# Patient Record
Sex: Male | Born: 1955 | Race: White | Hispanic: No | Smoking: Current every day smoker
Health system: Southern US, Community
[De-identification: ages and names within clinical notes are randomized; demographics above are authoritative.]

## PROBLEM LIST (undated history)

## (undated) DIAGNOSIS — M545 Low back pain, unspecified: Secondary | ICD-10-CM

## (undated) DIAGNOSIS — S82892A Other fracture of left lower leg, initial encounter for closed fracture: Secondary | ICD-10-CM

## (undated) DIAGNOSIS — J449 Chronic obstructive pulmonary disease, unspecified: Secondary | ICD-10-CM

## (undated) DIAGNOSIS — G8929 Other chronic pain: Secondary | ICD-10-CM

## (undated) DIAGNOSIS — Z720 Tobacco use: Secondary | ICD-10-CM

## (undated) HISTORY — PX: INCISION AND DRAINAGE OF WOUND: SHX1803

---

## 1998-08-05 ENCOUNTER — Emergency Department (HOSPITAL_COMMUNITY): Admission: EM | Admit: 1998-08-05 | Discharge: 1998-08-05 | Payer: Self-pay | Admitting: Emergency Medicine

## 2004-02-04 ENCOUNTER — Emergency Department (HOSPITAL_COMMUNITY): Admission: EM | Admit: 2004-02-04 | Discharge: 2004-02-04 | Payer: Self-pay | Admitting: Emergency Medicine

## 2004-02-15 ENCOUNTER — Emergency Department (HOSPITAL_COMMUNITY): Admission: EM | Admit: 2004-02-15 | Discharge: 2004-02-15 | Payer: Self-pay | Admitting: Emergency Medicine

## 2013-09-21 ENCOUNTER — Inpatient Hospital Stay (HOSPITAL_BASED_OUTPATIENT_CLINIC_OR_DEPARTMENT_OTHER)
Admission: EM | Admit: 2013-09-21 | Discharge: 2013-09-23 | DRG: 190 | Disposition: A | Discharge status: Home / Self Care | Payer: Self-pay | Attending: Internal Medicine | Admitting: Internal Medicine

## 2013-09-21 ENCOUNTER — Encounter (HOSPITAL_BASED_OUTPATIENT_CLINIC_OR_DEPARTMENT_OTHER): Payer: Self-pay | Admitting: Emergency Medicine

## 2013-09-21 ENCOUNTER — Emergency Department (HOSPITAL_BASED_OUTPATIENT_CLINIC_OR_DEPARTMENT_OTHER): Payer: Self-pay

## 2013-09-21 DIAGNOSIS — Z833 Family history of diabetes mellitus: Secondary | ICD-10-CM

## 2013-09-21 DIAGNOSIS — J209 Acute bronchitis, unspecified: Secondary | ICD-10-CM | POA: Diagnosis present

## 2013-09-21 DIAGNOSIS — J45901 Unspecified asthma with (acute) exacerbation: Principal | ICD-10-CM

## 2013-09-21 DIAGNOSIS — Z23 Encounter for immunization: Secondary | ICD-10-CM

## 2013-09-21 DIAGNOSIS — Z8249 Family history of ischemic heart disease and other diseases of the circulatory system: Secondary | ICD-10-CM

## 2013-09-21 DIAGNOSIS — J440 Chronic obstructive pulmonary disease with acute lower respiratory infection: Secondary | ICD-10-CM | POA: Diagnosis present

## 2013-09-21 DIAGNOSIS — J4489 Other specified chronic obstructive pulmonary disease: Secondary | ICD-10-CM | POA: Diagnosis present

## 2013-09-21 DIAGNOSIS — Z800 Family history of malignant neoplasm of digestive organs: Secondary | ICD-10-CM

## 2013-09-21 DIAGNOSIS — F19939 Other psychoactive substance use, unspecified with withdrawal, unspecified: Secondary | ICD-10-CM

## 2013-09-21 DIAGNOSIS — F17200 Nicotine dependence, unspecified, uncomplicated: Secondary | ICD-10-CM | POA: Diagnosis present

## 2013-09-21 DIAGNOSIS — R072 Precordial pain: Secondary | ICD-10-CM

## 2013-09-21 DIAGNOSIS — J449 Chronic obstructive pulmonary disease, unspecified: Secondary | ICD-10-CM | POA: Diagnosis present

## 2013-09-21 DIAGNOSIS — J9600 Acute respiratory failure, unspecified whether with hypoxia or hypercapnia: Secondary | ICD-10-CM | POA: Diagnosis present

## 2013-09-21 DIAGNOSIS — R0789 Other chest pain: Secondary | ICD-10-CM

## 2013-09-21 DIAGNOSIS — J441 Chronic obstructive pulmonary disease with (acute) exacerbation: Principal | ICD-10-CM

## 2013-09-21 DIAGNOSIS — J9601 Acute respiratory failure with hypoxia: Secondary | ICD-10-CM

## 2013-09-21 DIAGNOSIS — F17213 Nicotine dependence, cigarettes, with withdrawal: Secondary | ICD-10-CM

## 2013-09-21 HISTORY — DX: Chronic obstructive pulmonary disease, unspecified: J44.9

## 2013-09-21 HISTORY — DX: Tobacco use: Z72.0

## 2013-09-21 HISTORY — DX: Low back pain: M54.5

## 2013-09-21 HISTORY — DX: Other fracture of left lower leg, initial encounter for closed fracture: S82.892A

## 2013-09-21 HISTORY — DX: Low back pain, unspecified: M54.50

## 2013-09-21 HISTORY — DX: Other chronic pain: G89.29

## 2013-09-21 LAB — BASIC METABOLIC PANEL
Anion gap: 12 (ref 5–15)
BUN: 8 mg/dL (ref 6–23)
CHLORIDE: 101 meq/L (ref 96–112)
CO2: 29 mEq/L (ref 19–32)
Calcium: 9.9 mg/dL (ref 8.4–10.5)
Creatinine, Ser: 1 mg/dL (ref 0.50–1.35)
GFR calc non Af Amer: 82 mL/min — ABNORMAL LOW (ref >90)
Glucose, Bld: 126 mg/dL — ABNORMAL HIGH (ref 70–99)
POTASSIUM: 4.1 meq/L (ref 3.7–5.3)
Sodium: 142 mEq/L (ref 137–147)

## 2013-09-21 LAB — GLUCOSE, CAPILLARY: Glucose-Capillary: 152 mg/dL — ABNORMAL HIGH (ref 70–99)

## 2013-09-21 LAB — CBC WITH DIFFERENTIAL/PLATELET
Basophils Absolute: 0.1 10*3/uL (ref 0.0–0.1)
Basophils Relative: 1 % (ref 0–1)
Eosinophils Absolute: 0.9 10*3/uL — ABNORMAL HIGH (ref 0.0–0.7)
Eosinophils Relative: 9 % — ABNORMAL HIGH (ref 0–5)
HCT: 46.2 % (ref 39.0–52.0)
Hemoglobin: 15.7 g/dL (ref 13.0–17.0)
Lymphocytes Relative: 20 % (ref 12–46)
Lymphs Abs: 2 10*3/uL (ref 0.7–4.0)
MCH: 31.2 pg (ref 26.0–34.0)
MCHC: 34 g/dL (ref 30.0–36.0)
MCV: 91.8 fL (ref 78.0–100.0)
Monocytes Absolute: 0.4 10*3/uL (ref 0.1–1.0)
Monocytes Relative: 4 % (ref 3–12)
NEUTROS PCT: 66 % (ref 43–77)
Neutro Abs: 6.5 10*3/uL (ref 1.7–7.7)
Platelets: 260 10*3/uL (ref 150–400)
RBC: 5.03 MIL/uL (ref 4.22–5.81)
RDW: 13.4 % (ref 11.5–15.5)
WBC: 10 10*3/uL (ref 4.0–10.5)

## 2013-09-21 LAB — I-STAT ARTERIAL BLOOD GAS, ED
Bicarbonate: 25 mEq/L — ABNORMAL HIGH (ref 20.0–24.0)
O2 Saturation: 88 %
TCO2: 26 mmol/L (ref 0–100)
pCO2 arterial: 38.5 mmHg (ref 35.0–45.0)
pH, Arterial: 7.419 (ref 7.350–7.450)
pO2, Arterial: 53 mmHg — ABNORMAL LOW (ref 80.0–100.0)

## 2013-09-21 LAB — PRO B NATRIURETIC PEPTIDE: Pro B Natriuretic peptide (BNP): 27.6 pg/mL (ref 0–125)

## 2013-09-21 LAB — TROPONIN I
Troponin I: <0.3 ng/mL (ref <0.30)
Troponin I: <0.3 ng/mL (ref <0.30)

## 2013-09-21 LAB — MAGNESIUM: Magnesium: 2 mg/dL (ref 1.5–2.5)

## 2013-09-21 LAB — MRSA PCR SCREENING: MRSA BY PCR: NEGATIVE

## 2013-09-21 MED ORDER — ALBUTEROL (5 MG/ML) CONTINUOUS INHALATION SOLN
10.0000 mg/h | INHALATION_SOLUTION | RESPIRATORY_TRACT | Status: DC
  Administered 2013-09-21: 10 mg/h via RESPIRATORY_TRACT

## 2013-09-21 MED ORDER — ALBUTEROL SULFATE (2.5 MG/3ML) 0.083% IN NEBU: 10.0000 mg | INHALATION_SOLUTION | Freq: Once | RESPIRATORY_TRACT | Status: DC

## 2013-09-21 MED ORDER — ACETAMINOPHEN 650 MG RE SUPP: 650.0000 mg | Freq: Four times a day (QID) | RECTAL | Status: DC | PRN

## 2013-09-21 MED ORDER — PREDNISONE 50 MG PO TABS
60.0000 mg | ORAL_TABLET | Freq: Every day | ORAL | Status: DC
  Administered 2013-09-22: 60 mg via ORAL
  Filled 2013-09-21 (×2): qty 1

## 2013-09-21 MED ORDER — MAGNESIUM SULFATE IN D5W 10-5 MG/ML-% IV SOLN
1.0000 g | Freq: Once | INTRAVENOUS | Status: DC
  Filled 2013-09-21: qty 100

## 2013-09-21 MED ORDER — ALUM & MAG HYDROXIDE-SIMETH 200-200-20 MG/5ML PO SUSP
30.0000 mL | Freq: Four times a day (QID) | ORAL | Status: DC | PRN
  Filled 2013-09-21: qty 30

## 2013-09-21 MED ORDER — GUAIFENESIN-DM 100-10 MG/5ML PO SYRP
5.0000 mL | ORAL_SOLUTION | ORAL | Status: DC | PRN
  Administered 2013-09-21 – 2013-09-22 (×2): 5 mL via ORAL
  Filled 2013-09-21 (×3): qty 5

## 2013-09-21 MED ORDER — ACETAMINOPHEN 325 MG PO TABS
650.0000 mg | ORAL_TABLET | Freq: Four times a day (QID) | ORAL | Status: DC | PRN
  Administered 2013-09-22: 650 mg via ORAL
  Filled 2013-09-21: qty 2

## 2013-09-21 MED ORDER — IPRATROPIUM BROMIDE 0.02 % IN SOLN
1.0000 mg | Freq: Once | RESPIRATORY_TRACT | Status: AC
  Administered 2013-09-21: 1 mg via RESPIRATORY_TRACT
  Filled 2013-09-21: qty 5

## 2013-09-21 MED ORDER — IPRATROPIUM-ALBUTEROL 0.5-2.5 (3) MG/3ML IN SOLN
3.0000 mL | Freq: Three times a day (TID) | RESPIRATORY_TRACT | Status: DC
  Administered 2013-09-21 – 2013-09-22 (×2): 3 mL via RESPIRATORY_TRACT
  Filled 2013-09-21 (×2): qty 3

## 2013-09-21 MED ORDER — MOMETASONE FURO-FORMOTEROL FUM 100-5 MCG/ACT IN AERO
2.0000 | INHALATION_SPRAY | Freq: Two times a day (BID) | RESPIRATORY_TRACT | Status: DC
  Administered 2013-09-22 – 2013-09-23 (×3): 2 via RESPIRATORY_TRACT
  Filled 2013-09-21 (×2): qty 8.8

## 2013-09-21 MED ORDER — METHYLPREDNISOLONE SODIUM SUCC 125 MG IJ SOLR
60.0000 mg | Freq: Two times a day (BID) | INTRAMUSCULAR | Status: AC
  Administered 2013-09-21: 23:00:00 via INTRAVENOUS
  Administered 2013-09-21: 60 mg via INTRAVENOUS
  Filled 2013-09-21 (×3): qty 0.96

## 2013-09-21 MED ORDER — NICOTINE 14 MG/24HR TD PT24
14.0000 mg | MEDICATED_PATCH | Freq: Every day | TRANSDERMAL | Status: DC
  Administered 2013-09-21 – 2013-09-22 (×2): 14 mg via TRANSDERMAL
  Filled 2013-09-21 (×3): qty 1

## 2013-09-21 MED ORDER — MAGNESIUM SULFATE 40 MG/ML IJ SOLN
2.0000 g | Freq: Once | INTRAMUSCULAR | Status: AC
  Administered 2013-09-21: 2 g via INTRAVENOUS
  Filled 2013-09-21: qty 50

## 2013-09-21 MED ORDER — LEVOFLOXACIN 750 MG PO TABS
750.0000 mg | ORAL_TABLET | Freq: Every day | ORAL | Status: DC
  Administered 2013-09-22 – 2013-09-23 (×2): 750 mg via ORAL
  Filled 2013-09-21 (×2): qty 1

## 2013-09-21 MED ORDER — ASPIRIN EC 325 MG PO TBEC
325.0000 mg | DELAYED_RELEASE_TABLET | Freq: Once | ORAL | Status: AC
Start: 2013-09-21 — End: 2013-09-21
  Administered 2013-09-21: 325 mg via ORAL
  Filled 2013-09-21: qty 1

## 2013-09-21 MED ORDER — ALBUTEROL (5 MG/ML) CONTINUOUS INHALATION SOLN
10.0000 mg/h | INHALATION_SOLUTION | RESPIRATORY_TRACT | Status: DC
  Administered 2013-09-21: 10 mg/h via RESPIRATORY_TRACT
  Filled 2013-09-21: qty 20

## 2013-09-21 MED ORDER — IPRATROPIUM-ALBUTEROL 0.5-2.5 (3) MG/3ML IN SOLN
3.0000 mL | RESPIRATORY_TRACT | Status: DC
  Administered 2013-09-21 (×2): 3 mL via RESPIRATORY_TRACT
  Filled 2013-09-21 (×2): qty 3

## 2013-09-21 MED ORDER — CEFTRIAXONE SODIUM 1 G IJ SOLR
INTRAMUSCULAR | Status: AC
  Administered 2013-09-21: 1000 mg
  Filled 2013-09-21: qty 10

## 2013-09-21 MED ORDER — DEXTROSE 5 % IV SOLN: 1.0000 g | Freq: Once | INTRAVENOUS | Status: AC

## 2013-09-21 MED ORDER — BENZONATATE 100 MG PO CAPS
100.0000 mg | ORAL_CAPSULE | Freq: Three times a day (TID) | ORAL | Status: DC
  Administered 2013-09-21 – 2013-09-22 (×4): 100 mg via ORAL
  Filled 2013-09-21 (×6): qty 1

## 2013-09-21 MED ORDER — PNEUMOCOCCAL VAC POLYVALENT 25 MCG/0.5ML IJ INJ
0.5000 mL | INJECTION | INTRAMUSCULAR | Status: AC
  Administered 2013-09-23: 0.5 mL via INTRAMUSCULAR
  Filled 2013-09-21 (×2): qty 0.5

## 2013-09-21 MED ORDER — BENZONATATE 100 MG PO CAPS
200.0000 mg | ORAL_CAPSULE | Freq: Once | ORAL | Status: AC
  Administered 2013-09-21: 200 mg via ORAL
  Filled 2013-09-21: qty 2

## 2013-09-21 MED ORDER — SODIUM CHLORIDE 0.9 % IJ SOLN
3.0000 mL | Freq: Two times a day (BID) | INTRAMUSCULAR | Status: DC
  Administered 2013-09-21 (×2): 3 mL via INTRAVENOUS
  Administered 2013-09-22: 23:00:00 via INTRAVENOUS
  Administered 2013-09-22: 3 mL via INTRAVENOUS

## 2013-09-21 MED ORDER — SODIUM CHLORIDE 0.9 % IV SOLN: INTRAVENOUS | Status: DC

## 2013-09-21 MED ORDER — ASPIRIN EC 81 MG PO TBEC
81.0000 mg | DELAYED_RELEASE_TABLET | Freq: Every day | ORAL | Status: DC
  Administered 2013-09-22 – 2013-09-23 (×2): 81 mg via ORAL
  Filled 2013-09-21 (×2): qty 1

## 2013-09-21 MED ORDER — ONDANSETRON HCL 4 MG/2ML IJ SOLN: 4.0000 mg | Freq: Four times a day (QID) | INTRAMUSCULAR | Status: DC | PRN

## 2013-09-21 MED ORDER — BISACODYL 10 MG RE SUPP
10.0000 mg | Freq: Every day | RECTAL | Status: DC | PRN
Start: 2013-09-21 — End: 2013-09-23

## 2013-09-21 MED ORDER — ADULT MULTIVITAMIN W/MINERALS CH
1.0000 | ORAL_TABLET | Freq: Every day | ORAL | Status: DC
  Administered 2013-09-21 – 2013-09-23 (×3): 1 via ORAL
  Filled 2013-09-21 (×3): qty 1

## 2013-09-21 MED ORDER — POLYETHYLENE GLYCOL 3350 17 G PO PACK
17.0000 g | PACK | Freq: Every day | ORAL | Status: DC | PRN
  Filled 2013-09-21: qty 1

## 2013-09-21 MED ORDER — ENOXAPARIN SODIUM 40 MG/0.4ML ~~LOC~~ SOLN
40.0000 mg | SUBCUTANEOUS | Status: DC
  Administered 2013-09-21 – 2013-09-22 (×2): 40 mg via SUBCUTANEOUS
  Filled 2013-09-21 (×3): qty 0.4

## 2013-09-21 MED ORDER — AZITHROMYCIN 500 MG IV SOLR
500.0000 mg | Freq: Once | INTRAVENOUS | Status: AC
  Administered 2013-09-21: 500 mg via INTRAVENOUS
  Filled 2013-09-21: qty 500

## 2013-09-21 MED ORDER — ONDANSETRON HCL 4 MG PO TABS: 4.0000 mg | ORAL_TABLET | Freq: Four times a day (QID) | ORAL | Status: DC | PRN

## 2013-09-21 NOTE — ED Notes (Signed)
Patient states for the past 8-9 months he has been unable to walk more than @ 20 yards without becoming severely SOB. Patient states this was going on prior to his admission to [facility] (December 2014).

## 2013-09-21 NOTE — Progress Notes (Signed)
I spoke with Dr. Malachi BondsShort . She feels this patient's chest pain is due to his COPD and cough. We are cycling troponin's due to family history to be thorough.

## 2013-09-21 NOTE — ED Notes (Addendum)
Per EMS, patient with ongoing respiratory difficulties since December. Patient reports over past 4 days symptoms have worsened. Patient with cough, states he does have some phlegm, unwitnessed by EMS. Patient states it feels like "there is concrete in my chest". Patient drinks only Mtn Dew, does not drink water. Patient was given 10mg  albuterol neb, 0.5mg  atrovent neb, and 125mg  Solumedrol IV with EMS.

## 2013-09-21 NOTE — Clinical Social Work Note (Signed)
CSW consulted for financial concerns regarding lack of health insurance. CS[facility] has [facility] regarding pt's concerns. CSW signing off. Thank you for the referral.  Marcelline DeistEmily Makhai Fulco, MSW, [facility] Licensed Clinical Social Worker [phone] and [phone] [phone]

## 2013-09-21 NOTE — ED Notes (Signed)
Urinal provided.

## 2013-09-21 NOTE — Progress Notes (Signed)
  Echocardiogram 2D Echocardiogram has been performed.  Leta JunglingCooper, Anthany Thornhill M 09/21/2013, 2:41 PM

## 2013-09-21 NOTE — ED Notes (Signed)
MD at bedside. 

## 2013-09-21 NOTE — ED Provider Notes (Signed)
CSN: [id number]     Arrival date & time 09/21/13  [id number] History   First MD Initiated Contact with Patient 09/21/13 [phone]     Chief Complaint  Patient presents with  . Cough  . Shortness of Breath     (Consider location/radiation/quality/duration/timing/severity/associated sxs/prior Treatment) Patient is a 58 y.o. male presenting with cough and shortness of breath. The history is provided by the patient and the EMS personnel. The history is limited by the condition of the patient.  Cough Cough characteristics:  Productive Sputum characteristics:  White Severity:  Severe Onset quality:  Gradual Timing:  Constant Progression:  Worsening Chronicity:  Chronic Smoker: yes   Context: not sick contacts   Relieved by:  Nothing Worsened by:  Nothing tried Associated symptoms: chest pain, shortness of breath and wheezing   Associated symptoms: no fever   Risk factors: no chemical exposure and no recent travel   Shortness of Breath Associated symptoms: chest pain, cough and wheezing   Associated symptoms: no fever     Past Medical History  Diagnosis Date  . COPD (chronic obstructive pulmonary disease)    History reviewed. No pertinent past surgical history. No family history on file. History  Substance Use Topics  . Smoking status: Current Every Day Smoker -- 0.50 packs/day for 85 years    Types: Cigarettes  . Smokeless tobacco: Never Used  . Alcohol Use: No    Review of Systems  Constitutional: Negative for fever.  Respiratory: Positive for cough, shortness of breath and wheezing.   Cardiovascular: Positive for chest pain.  All other systems reviewed and are negative.     Allergies  Review of patient's allergies indicates no known allergies.  Home Medications   Prior to Admission medications   Not on File   BP 141/83  Pulse 94  Temp(Src) 97.4 F (36.3 C) (Oral)  Resp 23  Wt 132 lb (59.875 kg)  SpO2 94% Physical Exam  Constitutional: He is oriented to person,  place, and time. He appears well-developed and well-nourished.  HENT:  Head: Normocephalic and atraumatic.  Eyes: Conjunctivae are normal. Pupils are equal, round, and reactive to light.  Neck: Normal range of motion. Neck supple.  Cardiovascular: Normal rate, regular rhythm and intact distal pulses.   Pulmonary/Chest: No stridor. He is in respiratory distress. He has wheezes.  Abdominal: Soft. Bowel sounds are normal. He exhibits no distension. There is no tenderness. There is no rebound.  Musculoskeletal: Normal range of motion. He exhibits no edema.  Neurological: He is alert and oriented to person, place, and time.  Skin: Skin is warm and dry.  Psychiatric: He has a normal mood and affect.    ED Course  Procedures (including critical care time) Labs Review Labs Reviewed  CBC WITH DIFFERENTIAL - Abnormal; Notable for the following:    Eosinophils Relative 9 (*)    Eosinophils Absolute 0.9 (*)    All other components within normal limits  BASIC METABOLIC PANEL - Abnormal; Notable for the following:    Glucose, Bld 126 (*)    GFR calc non Af Amer 82 (*)    All other components within normal limits  I-STAT ARTERIAL BLOOD GAS, ED - Abnormal; Notable for the following:    pO2, Arterial 53.0 (*)    Bicarbonate 25.0 (*)    All other components within normal limits  TROPONIN I  PRO B NATRIURETIC PEPTIDE  MAGNESIUM  BLOOD GAS, VENOUS    Imaging Review No results found.   EKG Interpretation  None      MDM   Final diagnoses:  None     Date: 09/21/2013  Rate: 84  Rhythm: normal sinus rhythm  QRS Axis: normal  Intervals: normal  ST/T Wave abnormalities: normal  Conduction Disutrbances: none  Narrative Interpretation: unremarkable    Medications  albuterol (PROVENTIL) (2.5 MG/3ML) 0.083% nebulizer solution 10 mg (10 mg Nebulization Not Given 09/21/13 0617)  albuterol (PROVENTIL,VENTOLIN) solution continuous neb (10 mg/hr Nebulization Bolus from Bag 09/21/13 0617)   cefTRIAXone (ROCEPHIN) 1 g in dextrose 5 % 50 mL IVPB (not administered)  azithromycin (ZITHROMAX) 500 mg in dextrose 5 % 250 mL IVPB (not administered)  benzonatate (TESSALON) capsule 200 mg (200 mg Oral Given 09/21/13 0636)  magnesium sulfate IVPB 2 g 50 mL (0 g Intravenous Stopped 09/21/13 0643)    CRITICAL CARE Performed by: Jasmine Awe Total critical care time: 31 minutes Critical care time was exclusive of separately billable procedures and treating other patients. Critical care was necessary to treat or prevent imminent or life-threatening deterioration. Critical care was time spent personally by me on the following activities: development of treatment plan with patient and/or surrogate as well as nursing, discussions with consultants, evaluation of patient's response to treatment, examination of patient, obtaining history from patient or surrogate, ordering and performing treatments and interventions, ordering and review of laboratory studies, ordering and review of radiographic studies, pulse oximetry and re-evaluation of patient's condition.   Jasmine Awe, MD 09/21/13 [phone]

## 2013-09-21 NOTE — H&P (Addendum)
[facility] History and Physical  Wynonia HazardDavid W Ergle ACZ:[id number] DOB: 10/10/1955 DOA: 09/21/2013  Referring physician:  Terressa KoyanagiPalumbo-Rasch PCP:  No PCP Per Patient   Chief Complaint:  SOB  HPI:  The patient is a 58 y.o. year-old male with history of tobacco abuse with 5885 PYH and COPD who presents with SOB.  At his baseline, he is only able to walk about 30-ft before he becomes [place] of breath.  He does not have insurance nor does he have a primary care doctor.  He was seen in urgent care in December with SOB and diagnosed with COPD.  At that time, he was smoking 2PPD.  He was sent home with albuterol inhaler.  He has also been borrowing asthma medications.  He ran out of medication and last night, he became increasing SOB with productive cough that caused him to feel like he was going to pass out.  He sat completely upright and did purse lip breathing for several hours before he had enough breath to call 911.  Associated with 10/10 substernal chest tightness/pressure, nausea without vomiting, clamminess, and feeling that he was going to pass out.  [facility]  En route, he was given solumedrol 125mg  and duoneb.  In the ER, he was given duoneb followed by additional albuterol, ceftriaxone, azithromycin, and magnesium sulfate 3gm.  Labs were normal.  Troponin negative.  ECG with NSR with ST-segment depressions of 1mm in inferior leads.  [facility] for concern that he may need bipap.  Patient seen upon arrival.  He denies fevers, chills, current nausea, vomiting, and breathing is much better.     Review of Systems:  General:  Denies fevers, chills, weight loss or gain HEENT:  Denies changes to hearing and vision, rhinorrhea, sinus congestion, sore throat CV:  Denies palpitations, lower extremity edema.  PULM:  Per HPI GI:  Denies vomiting, constipation, diarrhea.   GU:  Denies dysuria, frequency, urgency ENDO:  Denies polyuria, polydipsia.   HEME:  Denies  hematemesis, blood in stools, melena, abnormal bruising or bleeding.  LYMPH:  Denies lymphadenopathy.   MSK:  Denies arthralgias, myalgias.   DERM:  Denies skin rash or ulcer.   NEURO:  Denies focal numbness, weakness, slurred speech, confusion, facial droop.  PSYCH:  Denies anxiety and depression.    Past Medical History  Diagnosis Date  . COPD (chronic obstructive pulmonary disease)   . Tobacco abuse    History reviewed. No pertinent past surgical history. Social History:  reports that he has been smoking Cigarettes.  He has a 85 pack-year smoking history. He has never used smokeless tobacco. He reports that he does not drink alcohol or use illicit drugs.    No Known Allergies  Family History  Problem Relation Age of Onset  . Esophageal cancer Father   . Colon cancer Sister   . Cancer - Other Brother   . Heart disease Mother   . Diabetes Mellitus II Mother   . Heart disease      multiple maternal family members  . Diabetes Mellitus II      multiple maternal family members     Prior to Admission medications   Not on File    Physical Exam: Filed Vitals:   09/21/13 0900 09/21/13 0936 09/21/13 1006 09/21/13 1100  BP: 112/60 102/57 117/44 109/65  Pulse: 91 95 103 89  Temp:  98.2 F (36.8 C)  97.7 F (36.5 C)  TempSrc:  Oral  Oral  Resp: 20 20 22 14   Weight:      SpO2: 97% 97% 99% 96%     General:  WM, NAD, on 2L [place]  Eyes:  PERRL, anicteric, non-injected.  ENT:  Nares clear.  OP clear, non-erythematous without plaques or exudates.  MMM.  Neck:  Supple without TM or JVD.    Lymph:  No cervical, supraclavicular, or submandibular LAD.  Cardiovascular:  RRR, normal S1, S2, without m/r/g.  2+ pulses, warm extremities  Respiratory:  Decreased BS at bases, full expiratory wheeze with prolonged exp phase and frequent cough, no focal rhonchi or wheeze without increased WOB.  Abdomen:  NABS.  Soft, ND/NT.    Skin:  No rashes or focal lesions.  Musculoskeletal:   Normal bulk and tone.  No LE edema.  Psychiatric:  A & O x 4.  Appropriate affect.  Neurologic:  CN 3-12 intact.  5/5 strength.  Sensation intact.  Labs on Admission:  Basic Metabolic Panel:  Recent Labs Lab 09/21/13 0539  NA 142  K 4.1  CL 101  CO2 29  GLUCOSE 126*  BUN 8  CREATININE 1.00  CALCIUM 9.9  MG 2.0   Liver Function Tests: No results found for this basename: AST, ALT, ALKPHOS, BILITOT, PROT, ALBUMIN,  in the last 168 hours No results found for this basename: LIPASE, AMYLASE,  in the last 168 hours No results found for this basename: AMMONIA,  in the last 168 hours CBC:  Recent Labs Lab 09/21/13 0539  WBC 10.0  NEUTROABS 6.5  HGB 15.7  HCT 46.2  MCV 91.8  PLT 260   Cardiac Enzymes:  Recent Labs Lab 09/21/13 0539  TROPONINI <0.30    BNP (last 3 results)  Recent Labs  09/21/13 0539  PROBNP 27.6   CBG: No results found for this basename: GLUCAP,  in the last 168 hours  Radiological Exams on Admission: Dg Chest Portable 1 View  09/21/2013   CLINICAL DATA:  COUGH SHORTNESS OF BREATH  EXAM: PORTABLE CHEST - 1 VIEW  COMPARISON:  None.  FINDINGS: Cardiac and mediastinal silhouettes are within normal limits.  Lungs are hyperinflated with attenuation of the pulmonary markings, most compatible with emphysema. No focal infiltrate or pulmonary edema. No pleural effusion. There is no pneumothorax.  No acute osseus abnormality.  IMPRESSION: 1. No active cardiopulmonary disease. 2. Emphysema.   Electronically Signed   By: Rise Mu M.D.   On: 09/21/2013 06:40    EKG: Independently reviewed.  NSR with 1mm ST segment depression in inferior leads  Assessment/Plan Principal Problem:   COPD exacerbation Active Problems:   COPD (chronic obstructive pulmonary disease)   Cigarette nicotine dependence with withdrawal   Acute respiratory failure with hypoxia   Chest pressure  ---  Acute hypoxic resp failure due to acute COPD exacerbation -   Solumedrol 60mg  IV bid today then prednisone tomorrow -  Duonebs q4h -  Start dulera -  Patient also requesting Rx for spiriva at discharge -  Levofloxacin -  Wean oxygen as tolerated -  May need home O2 so once improved, walking pulse ox -  CM consult for medication assistance and PCP referral, possibly community health and wellness  Chest pressure and ECG changes likely secondary to COPD, however, given smoking and family histories, should exclude ACS although this is much less likely -  Telemetry -  Cycle troponins -  A1c and lipid panel -  ECHO -  ASA daily -  Consider outpatient stress test once breathing  improves  Tobacco abuse and dependence -  Nicotine patch -  Additional smoking cessation counseling  No insurance, PCP, and inability to work due to COPD -  CM, SW, and Artist to please assist  Diet:  regular Access:  PIV IVF:  off Proph:  lovenox  Code Status: full Family Communication: patient alone Disposition Plan: Admit to telemetry  Time spent: 60 min Renae Fickle [facility] Pager [phone]  If 7PM-7AM, please contact night-coverage [email or web address] Password [facility] 09/21/2013, 12:08 PM

## 2013-09-21 NOTE — Care Management Note (Addendum)
    Page 1 of 2   09/23/2013     2:56:42 PM CARE MANAGEMENT NOTE 09/23/2013  Patient:  Corey Lawrence, Corey Lawrence   Account Number:  [id number]  Date Initiated:  09/21/2013  Documentation initiated by:  Elissa Hefty  Subjective/Objective Assessment:   adm w resp distress     Action/Plan:   lives w ex- wife   Anticipated DC Date:  09/24/2013   Anticipated DC Plan:  [place]  In-house referral  [place]  CM consult  [facility]  PCP issues  Medication Assistance  [place] Program  Follow-up appt scheduled      Choice offered to / List presented to:             Status of service:  Completed, signed off Medicare Important Message given?   (If response is "NO", the following Medicare IM given date fields will be blank) Date Medicare IM given:   Medicare IM given by:   Date Additional Medicare IM given:   Additional Medicare IM given by:    Discharge Disposition:  HOME/SELF CARE  Per UR Regulation:  Reviewed for med. necessity/level of care/duration of stay  If discussed at [place] of Stay Meetings, dates discussed:    Comments:  09/23/13 Ellan Lambert, RN, BSN [phone] Pt for dc home today.  He does not qualify for home oxygen, as sats in mid 90s on RA.  Referral to [facility] for home nebulizer machine.  Pt given [place] letter with explanation of program benefits.  Made appt for pt at [place] for Tues, August 25 at 9:00. Appt info put in Epic on AVS.  09/22/13 Ellan Lambert, RN, BSN [phone] Met with pt to discuss dc needs.  Pt states he has "never been to the doctor in his life."  He has no insurance, and states he lives with his ex-wife in [place].  Pt states he has been using nebulizer treatments at home, but ran out.   (Friend's mother died and friend let him borrow nebulizer machine and gave him medicine.)   He will likely need home oxygen and nebulizer machine for home.  Pt is  [facility]; will make hospital follow up appt for pt. Pt eligible for medication assistance through [facility] program; will provide [facility] letter prior to dc.  If pt eligible for home O2, and needs neb machine, will refer to [facility] for DME needs, as they have an indigent program. Development worker, community in touch with pt today regarding applying for disablilty.  Pt states he plans to go to DSS to apply for disability upon dc.  Will follow.  8/18 1241 p debbie dowell rn,bsn pt may need o2 at disch, will follow and assist as needed. no ins listed will get financ co to see-left vm for fin co that [place] alphabet w to see pt.

## 2013-09-21 NOTE — Progress Notes (Signed)
Transferred to 2W14 per order. Tolerated move well. VSS. Alert and oriented and in no distress. Staff on 2000 at bedside.

## 2013-09-21 NOTE — ED Notes (Signed)
[facility] at bedside preparing to transport. 

## 2013-09-22 LAB — HEMOGLOBIN A1C
Hgb A1c MFr Bld: 5.9 % — ABNORMAL HIGH (ref <5.7)
Mean Plasma Glucose: 123 mg/dL — ABNORMAL HIGH (ref <117)

## 2013-09-22 LAB — LIPID PANEL
CHOL/HDL RATIO: 3.1 ratio
CHOLESTEROL: 147 mg/dL (ref 0–200)
HDL: 48 mg/dL (ref >39)
LDL Cholesterol: 88 mg/dL (ref 0–99)
TRIGLYCERIDES: 53 mg/dL (ref <150)
VLDL: 11 mg/dL (ref 0–40)

## 2013-09-22 LAB — TROPONIN I: Troponin I: <0.3 ng/mL (ref <0.30)

## 2013-09-22 MED ORDER — PREDNISONE 50 MG PO TABS
60.0000 mg | ORAL_TABLET | Freq: Two times a day (BID) | ORAL | Status: DC
  Administered 2013-09-22 – 2013-09-23 (×2): 60 mg via ORAL
  Filled 2013-09-22 (×4): qty 1

## 2013-09-22 MED ORDER — IPRATROPIUM-ALBUTEROL 0.5-2.5 (3) MG/3ML IN SOLN
3.0000 mL | Freq: Four times a day (QID) | RESPIRATORY_TRACT | Status: DC
  Administered 2013-09-22 – 2013-09-23 (×4): 3 mL via RESPIRATORY_TRACT
  Filled 2013-09-22 (×3): qty 3

## 2013-09-22 MED ORDER — POLYETHYLENE GLYCOL 3350 17 G PO PACK
17.0000 g | PACK | Freq: Every day | ORAL | Status: DC
  Filled 2013-09-22 (×2): qty 1

## 2013-09-22 MED ORDER — NICOTINE 21 MG/24HR TD PT24
21.0000 mg | MEDICATED_PATCH | Freq: Every day | TRANSDERMAL | Status: DC
  Administered 2013-09-23: 21 mg via TRANSDERMAL
  Filled 2013-09-22: qty 1

## 2013-09-22 MED ORDER — DM-GUAIFENESIN ER 30-600 MG PO TB12
1.0000 | ORAL_TABLET | Freq: Two times a day (BID) | ORAL | Status: DC
  Administered 2013-09-22 – 2013-09-23 (×3): 1 via ORAL
  Filled 2013-09-22 (×4): qty 1

## 2013-09-22 NOTE — Progress Notes (Signed)
SATURATION QUALIFICATIONS: (This note is used to comply with regulatory documentation for home oxygen)  Patient Saturations on Room Air at Rest = 94  Patient Saturations on Room Air while Ambulating = 92  Please briefly explain why patient needs home oxygen:  COPD

## 2013-09-22 NOTE — Progress Notes (Signed)
[facility] PROGRESS NOTE  Corey Lawrence ZOX:[id number] DOB: 11/10/1955 DOA: 09/21/2013 PCP: No PCP Per Patient  Assessment/Plan:  Principal Problem:   COPD exacerbation Active Problems:   Cigarette nicotine dependence with withdrawal   Acute respiratory failure with hypoxia   Chest pressure  Continue abx, steroids, nebs, MI ruled out and echo without WMA. Increase activity  Code Status:  full Family Communication:   Disposition Plan:    HPI/Subjective: Coughing, minimally productive. Has not been OOB. Wishes to quit smoking. Overall, feels better  Objective: Filed Vitals:   09/22/13 0516  BP: 111/69  Pulse: 84  Temp: 98.5 F (36.9 C)  Resp: 19    Intake/Output Summary (Last 24 hours) at 09/22/13 1239 Last data filed at 09/22/13 0800  Gross per 24 hour  Intake    983 ml  Output    950 ml  Net     33 ml   Filed Weights   09/21/13 0518 09/22/13 0045  Weight: 59.875 kg (132 lb) 58 kg (127 lb 13.9 oz)    Exam:   General:  Coughing. Talkative. Breathing nonlabored  Cardiovascular: RRR without MGR  Respiratory: bilateral wheeze and rhonchi with moderate air movement  Abdomen: S, NT, ND  Ext: no CCE  Basic Metabolic Panel:  Recent Labs Lab 09/21/13 0539  NA 142  K 4.1  CL 101  CO2 29  GLUCOSE 126*  BUN 8  CREATININE 1.00  CALCIUM 9.9  MG 2.0   Liver Function Tests: No results found for this basename: AST, ALT, ALKPHOS, BILITOT, PROT, ALBUMIN,  in the last 168 hours No results found for this basename: LIPASE, AMYLASE,  in the last 168 hours No results found for this basename: AMMONIA,  in the last 168 hours CBC:  Recent Labs Lab 09/21/13 0539  WBC 10.0  NEUTROABS 6.5  HGB 15.7  HCT 46.2  MCV 91.8  PLT 260   Cardiac Enzymes:  Recent Labs Lab 09/21/13 0539 09/21/13 1234 09/21/13 1856 09/22/13 0006  TROPONINI <0.30 <0.30 <0.30 <0.30   BNP (last 3 results)  Recent Labs  09/21/13 0539  PROBNP 27.6   CBG:  Recent  Labs Lab 09/21/13 1110  GLUCAP 152*    Recent Results (from the past 240 hour(s))  MRSA PCR SCREENING     Status: None   Collection Time    09/21/13 10:56 AM      Result Value Ref Range Status   MRSA by PCR NEGATIVE  NEGATIVE Final   Comment:            The GeneXpert MRSA Assay (FDA     approved for NASAL specimens     only), is one component of a     comprehensive MRSA colonization     surveillance program. It is not     intended to diagnose MRSA     infection nor to guide or     monitor treatment for     MRSA infections.     Studies: Dg Chest Portable 1 View  09/21/2013   CLINICAL DATA:  COUGH SHORTNESS OF BREATH  EXAM: PORTABLE CHEST - 1 VIEW  COMPARISON:  None.  FINDINGS: Cardiac and mediastinal silhouettes are within normal limits.  Lungs are hyperinflated with attenuation of the pulmonary markings, most compatible with emphysema. No focal infiltrate or pulmonary edema. No pleural effusion. There is no pneumothorax.  No acute osseus abnormality.  IMPRESSION: 1. No active cardiopulmonary disease. 2. Emphysema.   Electronically Signed   By: Sharlet Salina  Phill MyronMcClintock M.D.   On: 09/21/2013 06:40    Scheduled Meds: . aspirin EC  81 mg Oral Daily  . benzonatate  100 mg Oral TID  . enoxaparin (LOVENOX) injection  40 mg Subcutaneous Q24H  . ipratropium-albuterol  3 mL Nebulization TID  . levofloxacin  750 mg Oral Daily  . mometasone-formoterol  2 puff Inhalation BID  . multivitamin with minerals  1 tablet Oral Daily  . nicotine  14 mg Transdermal Daily  . pneumococcal 23 valent vaccine  0.5 mL Intramuscular Tomorrow-1000  . predniSONE  60 mg Oral Q breakfast  . sodium chloride  3 mL Intravenous Q12H   Continuous Infusions: . sodium chloride 10 mL/hr (09/21/13 1115)    Time spent: 35 minutes  Sergi Gellner L  [facility] Pager [phone]. If 7PM-7AM, please contact night-coverage at [email or web address], password [email or web address] 09/22/2013, 12:39 PM  LOS: 1 day

## 2013-09-22 NOTE — Clinical Social Work Note (Signed)
Clinical Social Worker received referral for possible transportation needs.  Chart reviewed. Spoke with RN Case Manager who states that patient does not verbalize concerns regarding transportation concerns.  Patient states that his family will be able to provide transportation home and to to follow up appointments.  No further social work needs identified.  CSW signing off - please re consult if social work needs arise.  Corey Lawrence, Kentucky[place]CS[place] [phone]

## 2013-09-23 MED ORDER — LEVOFLOXACIN 500 MG PO TABS: 500.0000 mg | ORAL_TABLET | Freq: Every day | ORAL | Status: DC

## 2013-09-23 MED ORDER — ACETAMINOPHEN 325 MG PO TABS: 650.0000 mg | ORAL_TABLET | Freq: Four times a day (QID) | ORAL | Status: AC | PRN

## 2013-09-23 MED ORDER — IPRATROPIUM-ALBUTEROL 18-103 MCG/ACT IN AERO: 2.0000 | INHALATION_SPRAY | RESPIRATORY_TRACT | Status: DC | PRN

## 2013-09-23 MED ORDER — MOMETASONE FURO-FORMOTEROL FUM 100-5 MCG/ACT IN AERO: 2.0000 | INHALATION_SPRAY | Freq: Two times a day (BID) | RESPIRATORY_TRACT | Status: DC

## 2013-09-23 MED ORDER — IPRATROPIUM-ALBUTEROL 0.5-2.5 (3) MG/3ML IN SOLN: 3.0000 mL | RESPIRATORY_TRACT | Status: DC | PRN

## 2013-09-23 MED ORDER — PREDNISONE 20 MG PO TABS: ORAL_TABLET | ORAL | Status: DC

## 2013-09-23 MED ORDER — NICOTINE 21 MG/24HR TD PT24
21.0000 mg | MEDICATED_PATCH | Freq: Every day | TRANSDERMAL | Status: DC
Start: 2013-09-23 — End: 2014-12-05

## 2013-09-23 NOTE — Discharge Summary (Signed)
Physician Discharge Summary  Corey Lawrence ZOX:[id number] DOB: January 05, 1956 DOA: 09/21/2013  PCP: No PCP Per Patient  Admit date: 09/21/2013 Discharge date: 09/23/2013  Time spent: greater than 30 minutes  Recommendations for Outpatient Follow-up:  1.   Discharge Diagnoses:  Principal Problem:   COPD exacerbation Active Problems:   Cigarette nicotine dependence with withdrawal   Acute respiratory failure with hypoxia   Chest pressure Acute bronchitis  Discharge Condition: stable  Filed Weights   09/21/13 0518 09/22/13 0045  Weight: 59.875 kg (132 lb) 58 kg (127 lb 13.9 oz)    History of present illness:   58 y.o. year-old male with history of tobacco abuse with 51 PYH and COPD who presents with SOB. At his baseline, he is only able to walk about 30-ft before he becomes short of breath. He does not have insurance nor does he have a primary care doctor. He was seen in urgent care in December with SOB and diagnosed with COPD. At that time, he was smoking 2PPD. He was sent home with albuterol inhaler. He has also been borrowing asthma medications. He ran out of medication and last night, he became increasing SOB with productive cough that caused him to feel like he was going to pass out. He sat completely upright and did purse lip breathing for several hours before he had enough breath to call 911. Associated with 10/10 substernal chest tightness/pressure, nausea without vomiting, clamminess, and feeling that he was going to pass out. He was transported to [facility]. En route, he was given solumedrol 125mg  and duoneb. In the ER, he was given duoneb followed by additional albuterol, ceftriaxone, azithromycin, and magnesium sulfate 3gm. Labs were normal. Troponin negative. ECG with NSR with ST-segment depressions of 1mm in inferior leads. He was admitted to [facility] stepdown unit for concern that he may need bipap. Patient seen upon arrival. He denies fevers, chills, current nausea, vomiting, and breathing  is much better.   Hospital Course:  Started on antibiotics, steroids, oxygen, nebulizers. Care management consulted to assist with medication needs and follow up, as patient is uninsured. Encouraged to quit smoking.  By discharge, able to ambulate without hypoxia, feeling better and breath sounds clear.  Procedures:  none  Consultations:  none  Discharge Exam: Filed Vitals:   09/23/13 0614  BP: 120/79  Pulse: 87  Temp: 98.5 F (36.9 C)  Resp: 18    General: comfortable, talkative Cardiovascular: RRR Respiratory:  CTA without WRR   Discharge Instructions   Diet general    Complete by:  As directed      Increase activity slowly    Complete by:  As directed             Medication List         acetaminophen 325 MG tablet  Commonly known as:  TYLENOL  Take 2 tablets (650 mg total) by mouth every 6 (six) hours as needed for mild pain (or Fever >/= 101).     guaiFENesin 600 MG 12 hr tablet  Commonly known as:  MUCINEX  Take 600 mg by mouth 2 (two) times daily.     ipratropium-albuterol 0.5-2.5 (3) MG/3ML Soln  Commonly known as:  DUONEB  Take 3 mLs by nebulization every 4 (four) hours as needed (wheezing).     albuterol-ipratropium 18-103 MCG/ACT inhaler  Commonly known as:  COMBIVENT  Inhale 2 puffs into the lungs every 4 (four) hours as needed for wheezing or shortness of breath.     levofloxacin  500 MG tablet  Commonly known as:  LEVAQUIN  Take 1 tablet (500 mg total) by mouth daily.     mometasone-formoterol 100-5 MCG/ACT Aero  Commonly known as:  DULERA  Inhale 2 puffs into the lungs 2 (two) times daily.     MULTIVITAMIN ADULTS 50+ Tabs  Take 1 tablet by mouth daily.     nicotine 21 mg/24hr patch  Commonly known as:  NICODERM CQ - dosed in mg/24 hours  Place 1 patch (21 mg total) onto the skin daily.     predniSONE 20 MG tablet  Commonly known as:  DELTASONE  6 tablets daily for 2 days then decrease by 1 tablet every day until off.       No  Known Allergies     Follow-up Information   Follow up with [facility]    .   Contact information:   [address] [place] [address] [phone]       The results of significant diagnostics from this hospitalization (including imaging, microbiology, ancillary and laboratory) are listed below for reference.    Significant Diagnostic Studies: Dg Chest Portable 1 View  09/21/2013   CLINICAL DATA:  COUGH SHORTNESS OF BREATH  EXAM: PORTABLE CHEST - 1 VIEW  COMPARISON:  None.  FINDINGS: Cardiac and mediastinal silhouettes are within normal limits.  Lungs are hyperinflated with attenuation of the pulmonary markings, most compatible with emphysema. No focal infiltrate or pulmonary edema. No pleural effusion. There is no pneumothorax.  No acute osseus abnormality.  IMPRESSION: 1. No active cardiopulmonary disease. 2. Emphysema.   Electronically Signed   By: Rise Mu M.D.   On: 09/21/2013 06:40    Microbiology: Recent Results (from the past 240 hour(s))  MRSA PCR SCREENING     Status: None   Collection Time    09/21/13 10:56 AM      Result Value Ref Range Status   MRSA by PCR NEGATIVE  NEGATIVE Final   Comment:            The GeneXpert MRSA Assay (FDA     approved for NASAL specimens     only), is one component of a     comprehensive MRSA colonization     surveillance program. It is not     intended to diagnose MRSA     infection nor to guide or     monitor treatment for     MRSA infections.     Labs: Basic Metabolic Panel:  Recent Labs Lab 09/21/13 0539  NA 142  K 4.1  CL 101  CO2 29  GLUCOSE 126*  BUN 8  CREATININE 1.00  CALCIUM 9.9  MG 2.0   Liver Function Tests: No results found for this basename: AST, ALT, ALKPHOS, BILITOT, PROT, ALBUMIN,  in the last 168 hours No results found for this basename: LIPASE, AMYLASE,  in the last 168 hours No results found for this basename: AMMONIA,  in the last 168  hours CBC:  Recent Labs Lab 09/21/13 0539  WBC 10.0  NEUTROABS 6.5  HGB 15.7  HCT 46.2  MCV 91.8  PLT 260   Cardiac Enzymes:  Recent Labs Lab 09/21/13 0539 09/21/13 1234 09/21/13 1856 09/22/13 0006  TROPONINI <0.30 <0.30 <0.30 <0.30   BNP: BNP (last 3 results)  Recent Labs  09/21/13 0539  PROBNP 27.6   CBG:  Recent Labs Lab 09/21/13 1110  GLUCAP 152*       Signed:  Christphor Groft L  [facility] 09/23/2013, 10:14 AM

## 2013-09-28 ENCOUNTER — Telehealth: Payer: Self-pay | Admitting: Emergency Medicine

## 2013-09-29 ENCOUNTER — Encounter: Payer: Self-pay | Admitting: Internal Medicine

## 2013-09-29 ENCOUNTER — Ambulatory Visit: Payer: Self-pay | Attending: Internal Medicine | Admitting: Internal Medicine

## 2013-09-29 VITALS — BP 138/92 | HR 78 | Temp 98.0°F | Resp 18 | Wt 134.6 lb

## 2013-09-29 DIAGNOSIS — J4489 Other specified chronic obstructive pulmonary disease: Secondary | ICD-10-CM | POA: Insufficient documentation

## 2013-09-29 DIAGNOSIS — F1021 Alcohol dependence, in remission: Secondary | ICD-10-CM | POA: Insufficient documentation

## 2013-09-29 DIAGNOSIS — M545 Low back pain, unspecified: Secondary | ICD-10-CM | POA: Insufficient documentation

## 2013-09-29 DIAGNOSIS — R7309 Other abnormal glucose: Secondary | ICD-10-CM | POA: Insufficient documentation

## 2013-09-29 DIAGNOSIS — J449 Chronic obstructive pulmonary disease, unspecified: Secondary | ICD-10-CM

## 2013-09-29 DIAGNOSIS — F17200 Nicotine dependence, unspecified, uncomplicated: Secondary | ICD-10-CM

## 2013-09-29 DIAGNOSIS — R7303 Prediabetes: Secondary | ICD-10-CM | POA: Insufficient documentation

## 2013-09-29 DIAGNOSIS — G8929 Other chronic pain: Secondary | ICD-10-CM | POA: Insufficient documentation

## 2013-09-29 DIAGNOSIS — Z1211 Encounter for screening for malignant neoplasm of colon: Secondary | ICD-10-CM

## 2013-09-29 DIAGNOSIS — Z87891 Personal history of nicotine dependence: Secondary | ICD-10-CM | POA: Insufficient documentation

## 2013-09-29 DIAGNOSIS — R1031 Right lower quadrant pain: Secondary | ICD-10-CM | POA: Insufficient documentation

## 2013-09-29 LAB — POCT URINALYSIS DIPSTICK
Bilirubin, UA: NEGATIVE
Glucose, UA: NEGATIVE
Leukocytes, UA: NEGATIVE
Nitrite, UA: NEGATIVE
PROTEIN UA: NEGATIVE
RBC UA: NEGATIVE
SPEC GRAV UA: 1.02
UROBILINOGEN UA: 0.2
pH, UA: 7

## 2013-09-29 MED ORDER — DICLOFENAC SODIUM 75 MG PO TBEC: 75.0000 mg | DELAYED_RELEASE_TABLET | Freq: Two times a day (BID) | ORAL | Status: DC

## 2013-09-29 NOTE — Patient Instructions (Signed)
Smoking Cessation Quitting smoking is important to your health and has many advantages. However, it is not always easy to quit since nicotine is a very addictive drug. Oftentimes, people try 3 times or more before being able to quit. This document explains the best ways for you to prepare to quit smoking. Quitting takes hard work and a lot of effort, but you can do it. ADVANTAGES OF QUITTING SMOKING  You will live longer, feel better, and live better.  Your body will feel the impact of quitting smoking almost immediately.  Within 20 minutes, blood pressure decreases. Your pulse returns to its normal level.  After 8 hours, carbon monoxide levels in the blood return to normal. Your oxygen level increases.  After 24 hours, the chance of having a heart attack starts to decrease. Your breath, hair, and body stop smelling like smoke.  After 48 hours, damaged nerve endings begin to recover. Your sense of taste and smell improve.  After 72 hours, the body is virtually free of nicotine. Your bronchial tubes relax and breathing becomes easier.  After 2 to 12 weeks, lungs can hold more air. Exercise becomes easier and circulation improves.  The risk of having a heart attack, stroke, cancer, or lung disease is greatly reduced.  After 1 year, the risk of coronary heart disease is cut in half.  After 5 years, the risk of stroke falls to the same as a nonsmoker.  After 10 years, the risk of lung cancer is cut in half and the risk of other cancers decreases significantly.  After 15 years, the risk of coronary heart disease drops, usually to the level of a nonsmoker.  If you are pregnant, quitting smoking will improve your chances of having a healthy baby.  The people you live with, especially any children, will be healthier.  You will have extra money to spend on things other than cigarettes. QUESTIONS TO THINK ABOUT BEFORE ATTEMPTING TO QUIT You may want to talk about your answers with your  health care provider.  Why do you want to quit?  If you tried to quit in the past, what helped and what did not?  What will be the most difficult situations for you after you quit? How will you plan to handle them?  Who can help you through the tough times? Your family? Friends? A health care provider?  What pleasures do you get from smoking? What ways can you still get pleasure if you quit? Here are some questions to ask your health care provider:  How can you help me to be successful at quitting?  What medicine do you think would be best for me and how should I take it?  What should I do if I need more help?  What is smoking withdrawal like? How can I get information on withdrawal? GET READY  Set a quit date.  Change your environment by getting rid of all cigarettes, ashtrays, matches, and lighters in your home, car, or work. Do not let people smoke in your home.  Review your past attempts to quit. Think about what worked and what did not. GET SUPPORT AND ENCOURAGEMENT You have a better chance of being successful if you have help. You can get support in many ways.  Tell your family, friends, and coworkers that you are going to quit and need their support. Ask them not to smoke around you.  Get individual, group, or telephone counseling and support. Programs are available at local hospitals and health centers. Call   your local health department for information about programs in your area.  Spiritual beliefs and practices may help some smokers quit.  Download a "quit meter" on your computer to keep track of quit statistics, such as how long you have gone without smoking, cigarettes not smoked, and money saved.  Get a self-help book about quitting smoking and staying off tobacco. LEARN NEW SKILLS AND BEHAVIORS  Distract yourself from urges to smoke. Talk to someone, go for a walk, or occupy your time with a task.  Change your normal routine. Take a different route to work.  Drink tea instead of coffee. Eat breakfast in a different place.  Reduce your stress. Take a hot bath, exercise, or read a book.  Plan something enjoyable to do every day. Reward yourself for not smoking.  Explore interactive web-based programs that specialize in helping you quit. GET MEDICINE AND USE IT CORRECTLY Medicines can help you stop smoking and decrease the urge to smoke. Combining medicine with the above behavioral methods and support can greatly increase your chances of successfully quitting smoking.  Nicotine replacement therapy helps deliver nicotine to your body without the negative effects and risks of smoking. Nicotine replacement therapy includes nicotine gum, lozenges, inhalers, nasal sprays, and skin patches. Some may be available over-the-counter and others require a prescription.  Antidepressant medicine helps people abstain from smoking, but how this works is unknown. This medicine is available by prescription.  Nicotinic receptor partial agonist medicine simulates the effect of nicotine in your brain. This medicine is available by prescription. Ask your health care provider for advice about which medicines to use and how to use them based on your health history. Your health care provider will tell you what side effects to look out for if you choose to be on a medicine or therapy. Carefully read the information on the package. Do not use any other product containing nicotine while using a nicotine replacement product.  RELAPSE OR DIFFICULT SITUATIONS Most relapses occur within the first 3 months after quitting. Do not be discouraged if you start smoking again. Remember, most people try several times before finally quitting. You may have symptoms of withdrawal because your body is used to nicotine. You may crave cigarettes, be irritable, feel very hungry, cough often, get headaches, or have difficulty concentrating. The withdrawal symptoms are only temporary. They are strongest  when you first quit, but they will go away within 10-14 days. To reduce the chances of relapse, try to:  Avoid drinking alcohol. Drinking lowers your chances of successfully quitting.  Reduce the amount of caffeine you consume. Once you quit smoking, the amount of caffeine in your body increases and can give you symptoms, such as a rapid heartbeat, sweating, and anxiety.  Avoid smokers because they can make you want to smoke.  Do not let weight gain distract you. Many smokers will gain weight when they quit, usually less than 10 pounds. Eat a healthy diet and stay active. You can always lose the weight gained after you quit.  Find ways to improve your mood other than smoking. FOR MORE INFORMATION  www.smokefree.gov  Document Released: 01/15/2001 Document Revised: 06/07/2013 Document Reviewed: 05/02/2011 Exit[employer] Patient Information 2015 [employer]. This information is not intended to replace advice given to you by your health care provider. Make sure you discuss any questions you have with your health care provider. Diabetes Mellitus and Food It is important for you to manage your blood sugar (glucose) level. Your blood glucose level can be   greatly affected by what you eat. Eating healthier foods in the appropriate amounts throughout the day at about the same time each day will help you control your blood glucose level. It can also help slow or prevent worsening of your diabetes mellitus. Healthy eating may even help you improve the level of your blood pressure and reach or maintain a healthy weight.  HOW CAN FOOD AFFECT ME? Carbohydrates Carbohydrates affect your blood glucose level more than any other type of food. Your dietitian will help you determine how many carbohydrates to eat at each meal and teach you how to count carbohydrates. Counting carbohydrates is important to keep your blood glucose at a healthy level, especially if you are using insulin or taking certain medicines for  diabetes mellitus. Alcohol Alcohol can cause sudden decreases in blood glucose (hypoglycemia), especially if you use insulin or take certain medicines for diabetes mellitus. Hypoglycemia can be a life-threatening condition. Symptoms of hypoglycemia (sleepiness, dizziness, and disorientation) are similar to symptoms of having too much alcohol.  If your health care provider has given you approval to drink alcohol, do so in moderation and use the following guidelines:  Women should not have more than one drink per day, and men should not have more than two drinks per day. One drink is equal to:  12 oz of beer.  5 oz of wine.  1 oz of hard liquor.  Do not drink on an empty stomach.  Keep yourself hydrated. Have water, diet soda, or unsweetened iced tea.  Regular soda, juice, and other mixers might contain a lot of carbohydrates and should be counted. WHAT FOODS ARE NOT RECOMMENDED? As you make food choices, it is important to remember that all foods are not the same. Some foods have fewer nutrients per serving than other foods, even though they might have the same number of calories or carbohydrates. It is difficult to get your body what it needs when you eat foods with fewer nutrients. Examples of foods that you should avoid that are high in calories and carbohydrates but low in nutrients include:  Trans fats (most processed foods list trans fats on the Nutrition Facts label).  Regular soda.  Juice.  Candy.  Sweets, such as cake, pie, doughnuts, and cookies.  Fried foods. WHAT FOODS CAN I EAT? Have nutrient-rich foods, which will nourish your body and keep you healthy. The food you should eat also will depend on several factors, including:  The calories you need.  The medicines you take.  Your weight.  Your blood glucose level.  Your blood pressure level.  Your cholesterol level. You also should eat a variety of foods, including:  Protein, such as meat, poultry, fish,  tofu, nuts, and seeds (lean animal proteins are best).  Fruits.  Vegetables.  Dairy products, such as milk, cheese, and yogurt (low fat is best).  Breads, grains, pasta, cereal, rice, and beans.  Fats such as olive oil, trans fat-free margarine, canola oil, avocado, and olives. DOES EVERYONE WITH DIABETES MELLITUS HAVE THE SAME MEAL PLAN? Because every person with diabetes mellitus is different, there is not one meal plan that works for everyone. It is very important that you meet with a dietitian who will help you create a meal plan that is just right for you. Document Released: 10/18/2004 Document Revised: 01/26/2013 Document Reviewed: 12/18/2012 Exit[employer] Patient Information 2015 [employer]. This information is not intended to replace advice given to you by your health care provider. Make sure you discuss any questions   you have with your health care provider.  

## 2013-09-29 NOTE — Progress Notes (Signed)
Patient here to establish care Recently discharged from hospital for COPD and Bronchitis Currently on nicotine patch to help with quitting smoking Complains of lower right abd pain the past four days

## 2013-09-29 NOTE — Progress Notes (Signed)
Patient Demographics  Corey Lawrence, is a 58 y.o. male  JXB:[id number]  ZHY:[id number]  DOB - May 11, 1955  CC:  Chief Complaint  Patient presents with  . Hospitalization Follow-up       HPI: Corey Lawrence is a 58 y.o. male here today to establish medical care.Patient was recently hospitalized with symptoms of shortness of breath and chest pressure and tightness, EMR reviewed patient was treated for COPD exacerbation with nebulization, IV steroid IV antibiotics he was also given magnesium sulfate, ACS ruled out troponins negative, patient symptomatically improved and was discharged on oral antibiotic, DuoNeb as well as Combivent and Dulera, patient is not using nicotine patch and has stopped smoking cigarettes. Patient reported to have some lower abdominal pain and urinary frequency but denies any dysuria, patient denies any nausea vomiting any change in bowel habits, patient denies any fever chills, has occasional cough. Patient complains of chronic low back and left shoulder pain and is requesting some medication. Patient denies any recent fall or trauma. Patient has No headache, No chest pain, No abdominal pain - No Nausea, No new weakness tingling or numbness, No Cough - SOB.  No Known Allergies Past Medical History  Diagnosis Date  . COPD (chronic obstructive pulmonary disease)   . Tobacco abuse   . Ankle fracture, left     "casted; no OR"  . Chronic lower back pain    Current Outpatient Prescriptions on File Prior to Visit  Medication Sig Dispense Refill  . albuterol-ipratropium (COMBIVENT) 18-103 MCG/ACT inhaler Inhale 2 puffs into the lungs every 4 (four) hours as needed for wheezing or shortness of breath.  1 Inhaler  0  . ipratropium-albuterol (DUONEB) 0.5-2.5 (3) MG/3ML SOLN Take 3 mLs by nebulization every 4 (four) hours as needed (wheezing).  360 mL  0  . mometasone-formoterol (DULERA) 100-5 MCG/ACT AERO Inhale 2 puffs into the lungs 2 (two) times daily.  1 Inhaler  0    . nicotine (NICODERM CQ - DOSED IN MG/24 HOURS) 21 mg/24hr patch Place 1 patch (21 mg total) onto the skin daily.  28 patch  0  . predniSONE (DELTASONE) 20 MG tablet 6 tablets daily for 2 days then decrease by 1 tablet every day until off.  42 tablet  0  . acetaminophen (TYLENOL) 325 MG tablet Take 2 tablets (650 mg total) by mouth every 6 (six) hours as needed for mild pain (or Fever >/= 101).      Marland Kitchen guaiFENesin (MUCINEX) 600 MG 12 hr tablet Take 600 mg by mouth 2 (two) times daily.      Marland Kitchen levofloxacin (LEVAQUIN) 500 MG tablet Take 1 tablet (500 mg total) by mouth daily.  4 tablet  0  . Multiple Vitamins-Minerals (MULTIVITAMIN ADULTS 50+) TABS Take 1 tablet by mouth daily.       No current facility-administered medications on file prior to visit.   Family History  Problem Relation Age of Onset  . Esophageal cancer Father   . Colon cancer Sister   . Cancer - Other Brother   . Heart disease Mother   . Diabetes Mellitus II Mother   . Heart disease      multiple maternal family members  . Diabetes Mellitus II      multiple maternal family members   History   Social History  . Marital Status: Divorced    Spouse Name: N/A    Number of Children: N/A  . Years of Education: N/A   Occupational History  . Not  on file.   Social History Main Topics  . Smoking status: Current Every Day Smoker -- 1.00 packs/day for 43 years    Types: Cigarettes  . Smokeless tobacco: Never Used     Comment: currently on patch  . 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 Drug Use: Yes     Comment: 09/21/2013 "used marijuana once when I was 20; used cocaine  in ~ 2000; stopped in ~ 2003"  . Sexual Activity: Not Currently   Other Topics Concern  . Not on file   Social History Narrative   Lives in trailer.  Not married.  5 children.      Review of Systems: Constitutional: Negative for fever, chills, diaphoresis, activity change, appetite change and fatigue. HENT: Negative for  ear pain, nosebleeds, congestion, facial swelling, rhinorrhea, neck pain, neck stiffness and ear discharge.  Eyes: Negative for pain, discharge, redness, itching and visual disturbance. Respiratory: Negative for cough, choking, chest tightness, shortness of breath, wheezing and stridor.  Cardiovascular: Negative for chest pain, palpitations and leg swelling. Gastrointestinal: Negative for abdominal distention. Genitourinary: Negative for dysuria, urgency, frequency, hematuria, flank pain, decreased urine volume, difficulty urinating and dyspareunia.  Musculoskeletal: Negative for back pain, joint swelling, arthralgia and gait problem. Neurological: Negative for dizziness, tremors, seizures, syncope, facial asymmetry, speech difficulty, weakness, light-headedness, numbness and headaches.  Hematological: Negative for adenopathy. Does not bruise/bleed easily. Psychiatric/Behavioral: Negative for hallucinations, behavioral problems, confusion, dysphoric mood, decreased concentration and agitation.    Objective:   Filed Vitals:   09/29/13 0923  BP: 138/92  Pulse: 78  Temp: 98 F (36.7 C)  Resp: 18    Physical Exam: Constitutional: Patient appears well-developed and well-nourished. No distress. HENT: Normocephalic, atraumatic, External right and left ear normal. Oropharynx is clear and moist.  Eyes: Conjunctivae and EOM are normal. PERRLA, no scleral icterus. Neck: Normal ROM. Neck supple. No JVD. No tracheal deviation. No thyromegaly. CVS: RRR, S1/S2 +, no murmurs, no gallops, no carotid bruit.  Pulmonary: Effort and breath sounds normal, no stridor, rhonchi, minimal wheezes, rales.  Abdominal: Soft. BS +, no distension, tenderness, rebound or guarding.  Musculoskeletal: Normal range of motion. No edema and no tenderness.  Neuro: Alert. Normal reflexes, muscle tone coordination. No cranial nerve deficit. Skin: Skin is warm and dry. No rash noted. Not diaphoretic. No erythema. No  pallor. Psychiatric: Normal mood and affect. Behavior, judgment, thought content normal.  Lab Results  Component Value Date   WBC 10.0 09/21/2013   HGB 15.7 09/21/2013   HCT 46.2 09/21/2013   MCV 91.8 09/21/2013   PLT 260 09/21/2013   Lab Results  Component Value Date   CREATININE 1.00 09/21/2013   BUN 8 09/21/2013   NA 142 09/21/2013   K 4.1 09/21/2013   CL 101 09/21/2013   CO2 29 09/21/2013    Lab Results  Component Value Date   HGBA1C 5.9* 09/22/2013   Lipid Panel     Component Value Date/Time   CHOL 147 09/22/2013 0006   TRIG 53 09/22/2013 0006   HDL 48 09/22/2013 0006   CHOLHDL 3.1 09/22/2013 0006   VLDL 11 09/22/2013 0006   LDLCALC 88 09/22/2013 0006       Assessment and plan:   1. Abdominal pain, chronic, right lower quadrant  - Urinalysis Dipstick negative for infection. Examination benign. Results for orders placed in visit on 09/29/13  POCT URINALYSIS DIPSTICK      Result Value Ref Range   Color,  UA yellow     Clarity, UA cloudy     Glucose, UA neg     Bilirubin, UA neg     Ketones, UA trace     Spec Grav, UA 1.020     Blood, UA neg     pH, UA 7.0     Protein, UA neg     Urobilinogen, UA 0.2     Nitrite, UA neg     Leukocytes, UA Negative      2. Chronic obstructive pulmonary disease, unspecified COPD, unspecified chronic bronchitis type Patient is currently on Dulera, Combivent and units  3. Smoking Patient currently using nicotine patch.  4. Prediabetes Recent A1c is 5.9% Advised patient for diabetes meal planning. Will repeat A1c in 3 months  5. Chronic low back pain  - diclofenac (VOLTAREN) 75 MG EC tablet; Take 1 tablet (75 mg total) by mouth 2 (two) times daily.  Dispense: 30 tablet; Refill: 3  6. Special screening for malignant neoplasms, colon  - Ambulatory referral to Gastroenterology      Health Maintenance -Colonoscopy: referred to GI  Return in about 3 months (around 12/30/2013) for COPD, back pain, IFG.   Doris Cheadle, MD

## 2013-12-06 ENCOUNTER — Emergency Department (HOSPITAL_COMMUNITY): Payer: Self-pay

## 2013-12-06 ENCOUNTER — Emergency Department (HOSPITAL_COMMUNITY)
Admission: EM | Admit: 2013-12-06 | Discharge: 2013-12-06 | Disposition: A | Discharge status: Home / Self Care | Payer: Self-pay | Attending: Emergency Medicine | Admitting: Emergency Medicine

## 2013-12-06 ENCOUNTER — Encounter (HOSPITAL_COMMUNITY): Payer: Self-pay | Admitting: *Deleted

## 2013-12-06 DIAGNOSIS — Z7951 Long term (current) use of inhaled steroids: Secondary | ICD-10-CM | POA: Insufficient documentation

## 2013-12-06 DIAGNOSIS — Z720 Tobacco use: Secondary | ICD-10-CM | POA: Insufficient documentation

## 2013-12-06 DIAGNOSIS — R5383 Other fatigue: Secondary | ICD-10-CM | POA: Insufficient documentation

## 2013-12-06 DIAGNOSIS — J441 Chronic obstructive pulmonary disease with (acute) exacerbation: Secondary | ICD-10-CM | POA: Insufficient documentation

## 2013-12-06 DIAGNOSIS — R0789 Other chest pain: Secondary | ICD-10-CM | POA: Insufficient documentation

## 2013-12-06 DIAGNOSIS — Z791 Long term (current) use of non-steroidal anti-inflammatories (NSAID): Secondary | ICD-10-CM | POA: Insufficient documentation

## 2013-12-06 DIAGNOSIS — G8929 Other chronic pain: Secondary | ICD-10-CM | POA: Insufficient documentation

## 2013-12-06 DIAGNOSIS — Z79899 Other long term (current) drug therapy: Secondary | ICD-10-CM | POA: Insufficient documentation

## 2013-12-06 DIAGNOSIS — Z8781 Personal history of (healed) traumatic fracture: Secondary | ICD-10-CM | POA: Insufficient documentation

## 2013-12-06 LAB — COMPREHENSIVE METABOLIC PANEL
ALBUMIN: 3.9 g/dL (ref 3.5–5.2)
ALK PHOS: 69 U/L (ref 39–117)
ALT: 9 U/L (ref 0–53)
AST: 14 U/L (ref 0–37)
Anion gap: 12 (ref 5–15)
BILIRUBIN TOTAL: 0.4 mg/dL (ref 0.3–1.2)
BUN: 14 mg/dL (ref 6–23)
CHLORIDE: 105 meq/L (ref 96–112)
CO2: 26 mEq/L (ref 19–32)
Calcium: 9.3 mg/dL (ref 8.4–10.5)
Creatinine, Ser: 0.95 mg/dL (ref 0.50–1.35)
GFR calc Af Amer: >90 mL/min (ref >90)
GFR calc non Af Amer: >90 mL/min (ref >90)
Glucose, Bld: 91 mg/dL (ref 70–99)
POTASSIUM: 4 meq/L (ref 3.7–5.3)
Sodium: 143 mEq/L (ref 137–147)
Total Protein: 7 g/dL (ref 6.0–8.3)

## 2013-12-06 LAB — CBC
HCT: 41.6 % (ref 39.0–52.0)
Hemoglobin: 14.3 g/dL (ref 13.0–17.0)
MCH: 31.4 pg (ref 26.0–34.0)
MCHC: 34.4 g/dL (ref 30.0–36.0)
MCV: 91.4 fL (ref 78.0–100.0)
Platelets: 290 10*3/uL (ref 150–400)
RBC: 4.55 MIL/uL (ref 4.22–5.81)
RDW: 13.2 % (ref 11.5–15.5)
WBC: 5.8 10*3/uL (ref 4.0–10.5)

## 2013-12-06 LAB — D-DIMER, QUANTITATIVE: D-Dimer, Quant: <0.27 ug/mL-FEU (ref 0.00–0.48)

## 2013-12-06 LAB — TROPONIN I: Troponin I: <0.3 ng/mL (ref <0.30)

## 2013-12-06 LAB — I-STAT TROPONIN, ED: Troponin i, poc: 0.01 ng/mL (ref 0.00–0.08)

## 2013-12-06 MED ORDER — IPRATROPIUM-ALBUTEROL 0.5-2.5 (3) MG/3ML IN SOLN
3.0000 mL | Freq: Once | RESPIRATORY_TRACT | Status: AC
  Administered 2013-12-06: 3 mL via RESPIRATORY_TRACT
  Filled 2013-12-06: qty 3

## 2013-12-06 MED ORDER — ALBUTEROL SULFATE HFA 108 (90 BASE) MCG/ACT IN AERS
1.0000 | INHALATION_SPRAY | Freq: Four times a day (QID) | RESPIRATORY_TRACT | Status: DC | PRN
Start: 2013-12-06 — End: 2014-02-14

## 2013-12-06 MED ORDER — METHYLPREDNISOLONE SODIUM SUCC 125 MG IJ SOLR
125.0000 mg | Freq: Once | INTRAMUSCULAR | Status: AC
  Administered 2013-12-06: 125 mg via INTRAVENOUS
  Filled 2013-12-06: qty 2

## 2013-12-06 MED ORDER — IPRATROPIUM-ALBUTEROL 0.5-2.5 (3) MG/3ML IN SOLN: 3.0000 mL | RESPIRATORY_TRACT | Status: DC | PRN

## 2013-12-06 MED ORDER — IPRATROPIUM-ALBUTEROL 18-103 MCG/ACT IN AERO: 2.0000 | INHALATION_SPRAY | RESPIRATORY_TRACT | Status: DC | PRN

## 2013-12-06 MED ORDER — MOMETASONE FURO-FORMOTEROL FUM 100-5 MCG/ACT IN AERO: 2.0000 | INHALATION_SPRAY | Freq: Two times a day (BID) | RESPIRATORY_TRACT | Status: DC

## 2013-12-06 MED ORDER — ALBUTEROL SULFATE (2.5 MG/3ML) 0.083% IN NEBU
5.0000 mg | INHALATION_SOLUTION | Freq: Once | RESPIRATORY_TRACT | Status: AC
  Administered 2013-12-06: 5 mg via RESPIRATORY_TRACT
  Filled 2013-12-06: qty 6

## 2013-12-06 MED ORDER — DOXYCYCLINE HYCLATE 100 MG PO CAPS: 100.0000 mg | ORAL_CAPSULE | Freq: Two times a day (BID) | ORAL | Status: DC

## 2013-12-06 MED ORDER — PREDNISONE 50 MG PO TABS: ORAL_TABLET | ORAL | Status: DC

## 2013-12-06 NOTE — ED Notes (Signed)
Pt ambulated approximately 14050ft unassisted without difficulty. O sats maintained btwn 93%-95% with heart rate in the 70's and on room air. Dr. Manus Gunningancour informed.

## 2013-12-06 NOTE — ED Notes (Signed)
Lt. Sided cp, radiating up neck; more of the breathing is causing the pain than the heart. Ran out of inhalers and wheezing;

## 2013-12-06 NOTE — ED Provider Notes (Signed)
CSN: [id number]     Arrival date & time 12/06/13  1446 History   First MD Initiated Contact with Patient 12/06/13 1458     Chief Complaint  Patient presents with  . Chest Pain  . Shortness of Breath  . Pleurisy  . Wheezing     (Consider location/radiation/quality/duration/timing/severity/associated sxs/prior Treatment) HPI Comments: Patient with 3 week history of gradually worsening shortness of breath that is worse on exertion. He has left-sided chest pain with coughing only. It is better with lying down. He has a history of COPD and has been out of his inhalers and medications for the past several weeks. He denies any fever. His cough is nonproductive. He continues to smoke. He denies any leg pain or leg swelling. He denies any exertional chest pain. He only has chest pain when he coughs or chest is palpated. He states he had an admission in August for similar symptoms that improved with a course of prednisone.  The history is provided by the patient.    Past Medical History  Diagnosis Date  . COPD (chronic obstructive pulmonary disease)   . Tobacco abuse   . Ankle fracture, left     "casted; no OR"  . Chronic lower back pain    Past Surgical History  Procedure Laterality Date  . Incision and drainage of wound Left ~ 1977    "splinter got infected between" digits 2 & 3   Family History  Problem Relation Age of Onset  . Esophageal cancer Father   . Colon cancer Sister   . Cancer - Other Brother   . Heart disease Mother   . Diabetes Mellitus II Mother   . Heart disease      multiple maternal family members  . Diabetes Mellitus II      multiple maternal family members   History  Substance Use Topics  . Smoking status: Current Every Day Smoker -- 1.00 packs/day for 43 years    Types: Cigarettes  . Smokeless tobacco: Never Used     Comment: currently on patch  . 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 Negative for fever.  Respiratory: Positive for cough, chest tightness and shortness of breath.   Cardiovascular: Positive for chest pain.  Gastrointestinal: Negative for nausea, vomiting and abdominal pain.  Genitourinary: Negative for dysuria and hematuria.  Musculoskeletal: Negative for myalgias and arthralgias.  Skin: Negative for wound.  Neurological: Negative for dizziness, weakness and headaches.  A complete 10 system review of systems was obtained and all systems are negative except as noted in the HPI and PMH.      Allergies  Review of patient's allergies indicates no known allergies.  Home Medications   Prior to Admission medications   Medication Sig Start Date End Date Taking? Authorizing Provider  acetaminophen (TYLENOL) 325 MG tablet Take 2 tablets (650 mg total) by mouth every 6 (six) hours as needed for mild pain (or Fever >/= 101). 09/23/13  Yes Christiane Ha, MD  guaiFENesin (MUCINEX) 600 MG 12 hr tablet Take 600 mg by mouth 2 (two) times daily.   Yes Historical Provider, MD  Ibuprofen-Diphenhydramine Cit (IBUPROFEN PM) 200-38 MG TABS Take 2 tablets by mouth at bedtime.   Yes Historical Provider, MD  albuterol (PROVENTIL HFA;VENTOLIN HFA) 108 (90 BASE) MCG/ACT inhaler Inhale 1-2 puffs into the lungs every 6 (six) hours as needed for wheezing  or shortness of breath. 12/06/13   Glynn OctaveStephen Anitra Doxtater, MD  albuterol-ipratropium (COMBIVENT) 18-103 MCG/ACT inhaler Inhale 2 puffs into the lungs every 4 (four) hours as needed for wheezing or shortness of breath. 12/06/13   Glynn OctaveStephen Ellissa Ayo, MD  diclofenac (VOLTAREN) 75 MG EC tablet Take 1 tablet (75 mg total) by mouth 2 (two) times daily. 09/29/13   Doris Cheadleeepak Advani, MD  doxycycline (VIBRAMYCIN) 100 MG capsule Take 1 capsule (100 mg total) by mouth 2 (two) times daily. 12/06/13   Glynn OctaveStephen Oree Hislop, MD  ipratropium-albuterol (DUONEB) 0.5-2.5 (3) MG/3ML SOLN Take 3 mLs by nebulization  every 4 (four) hours as needed (wheezing). 12/06/13   Glynn OctaveStephen Kimesha Claxton, MD  levofloxacin (LEVAQUIN) 500 MG tablet Take 1 tablet (500 mg total) by mouth daily. 09/23/13   Christiane Haorinna L Sullivan, MD  mometasone-formoterol (DULERA) 100-5 MCG/ACT AERO Inhale 2 puffs into the lungs 2 (two) times daily. 12/06/13   Glynn OctaveStephen Anja Neuzil, MD  Multiple Vitamins-Minerals (MULTIVITAMIN ADULTS 50+) TABS Take 1 tablet by mouth daily.    Historical Provider, MD  nicotine (NICODERM CQ - DOSED IN MG/24 HOURS) 21 mg/24hr patch Place 1 patch (21 mg total) onto the skin daily. 09/23/13   Christiane Haorinna L Sullivan, MD  predniSONE (DELTASONE) 20 MG tablet 6 tablets daily for 2 days then decrease by 1 tablet every day until off. 09/23/13   Christiane Haorinna L Sullivan, MD  predniSONE (DELTASONE) 50 MG tablet 1 tablet PO daily 12/06/13   Glynn OctaveStephen Tanisa Lagace, MD   BP 118/79 mmHg  Pulse 85  Temp(Src) 97.8 F (36.6 C) (Oral)  Resp 24  Ht 5\' 8"  (1.727 m)  Wt 145 lb (65.772 kg)  BMI 22.05 kg/m2  SpO2 96% Physical Exam  Constitutional: He is oriented to person, place, and time. He appears well-developed and well-nourished. He appears distressed.  In mild respiratory distress  HENT:  Head: Normocephalic and atraumatic.  Mouth/Throat: Oropharynx is clear and moist. No oropharyngeal exudate.  Eyes: Conjunctivae and EOM are normal. Pupils are equal, round, and reactive to light.  Neck: Normal range of motion. Neck supple.  No meningismus.  Cardiovascular: Normal rate, regular rhythm, normal heart sounds and intact distal pulses.   No murmur heard. Pulmonary/Chest: Effort normal. No respiratory distress. He has wheezes.   Reduced air exchange with scattered expiratory wheezing  Abdominal: Soft. There is no tenderness. There is no rebound and no guarding.  Musculoskeletal: Normal range of motion. He exhibits no edema or tenderness.  Neurological: He is alert and oriented to person, place, and time. No cranial nerve deficit. He exhibits normal muscle tone.  Coordination normal.  No ataxia on finger to nose bilaterally. No pronator drift. 5/5 strength throughout. CN 2-12 intact. Negative Romberg. Equal grip strength. Sensation intact. Gait is normal.   Skin: Skin is warm.  Psychiatric: He has a normal mood and affect. His behavior is normal.  Nursing note and vitals reviewed.   ED Course  Procedures (including critical care time) Labs Review Labs Reviewed  CBC  COMPREHENSIVE METABOLIC PANEL  D-DIMER, QUANTITATIVE  TROPONIN I  I-STAT TROPOININ, ED    Imaging Review Dg Chest 2 View  12/06/2013   CLINICAL DATA:  Cough and left chest pain for the past 3-4 weeks. Smoker. History of COPD and bronchitis.  EXAM: CHEST  2 VIEW  COMPARISON:  09/21/2013.  FINDINGS: Normal sized heart. Clear lungs. The lungs remain hyperexpanded with increased central peribronchial thickening. Mild thoracic spine degenerative changes.  IMPRESSION: 1. Progressive bronchitic changes. 2. Stable changes of COPD.  Electronically Signed   By: Gordan PaymentSteve  Reid M.D.   On: 12/06/2013 15:45     EKG Interpretation   Date/Time:  Monday December 06 2013 14:53:00 EST Ventricular Rate:  76 PR Interval:  136 QRS Duration: 72 QT Interval:  388 QTC Calculation: 436 R Axis:   79 Text Interpretation:  Normal sinus rhythm Normal ECG Artifact No  significant change was found Confirmed by Manus GunningANCOUR  MD, Lurine Imel [phone]([id number]) on  12/06/2013 3:01:19 PM      MDM   Final diagnoses:  COPD exacerbation   Shortness of breath with wheezing and history of COPD. Noncompliance of medications. EKG is unchanged.  Chest x-ray without infiltrate. D-dimer negative. Chest pain is atypical and only with coughing.  Patient given nebulizers, steroids for COPD exacerbation. Work of breathing and wheezing have improved after nebulizers. He is ambulatory without desaturation. Patient wishing to go home.  Troponin negative x2.  Chest pain only with coughing, atypical for ACS> d-dimer negative. Inhalers  refilled.  Prednisone and antibiotics given. Smoking cessation discussed. Follow up with PCP. Return precautions discussed.  Glynn OctaveStephen Leiah Giannotti, MD 12/07/13 [phone]

## 2013-12-06 NOTE — ED Notes (Signed)
Pt receiving duo neb at this time.   Pt st's he feels much better.

## 2013-12-06 NOTE — Discharge Instructions (Signed)
Chronic Obstructive Pulmonary Disease Take the medications as prescribed. Stop smoking. Follow up with your doctor. Return to the ED if you develop new or worsening symptoms. Chronic obstructive pulmonary disease (COPD) is a common lung condition in which airflow from the lungs is limited. COPD is a general term that can be used to describe many different lung problems that limit airflow, including both chronic bronchitis and emphysema. If you have COPD, your lung function will probably never return to normal, but there are measures you can take to improve lung function and make yourself feel better.  CAUSES   Smoking (common).   Exposure to secondhand smoke.   Genetic problems.  Chronic inflammatory lung diseases or recurrent infections. SYMPTOMS   Shortness of breath, especially with physical activity.   Deep, persistent (chronic) cough with a large amount of thick mucus.   Wheezing.   Rapid breaths (tachypnea).   Gray or bluish discoloration (cyanosis) of the skin, especially in fingers, toes, or lips.   Fatigue.   Weight loss.   Frequent infections or episodes when breathing symptoms become much worse (exacerbations).   Chest tightness. DIAGNOSIS  Your health care provider will take a medical history and perform a physical examination to make the initial diagnosis. Additional tests for COPD may include:   Lung (pulmonary) function tests.  Chest X-ray.  CT scan.  Blood tests. TREATMENT  Treatment available to help you feel better when you have COPD includes:   Inhaler and nebulizer medicines. These help manage the symptoms of COPD and make your breathing more comfortable.  Supplemental oxygen. Supplemental oxygen is only helpful if you have a low oxygen level in your blood.   Exercise and physical activity. These are beneficial for nearly all people with COPD. Some people may also benefit from a pulmonary rehabilitation program. HOME CARE INSTRUCTIONS     Take all medicines (inhaled or pills) as directed by your health care provider.  Avoid over-the-counter medicines or cough syrups that dry up your airway (such as antihistamines) and slow down the elimination of secretions unless instructed otherwise by your health care provider.   If you are a smoker, the most important thing that you can do is stop smoking. Continuing to smoke will cause further lung damage and breathing trouble. Ask your health care provider for help with quitting smoking. He or she can direct you to community resources or hospitals that provide support.  Avoid exposure to irritants such as smoke, chemicals, and fumes that aggravate your breathing.  Use oxygen therapy and pulmonary rehabilitation if directed by your health care provider. If you require home oxygen therapy, ask your health care provider whether you should purchase a pulse oximeter to measure your oxygen level at home.   Avoid contact with individuals who have a contagious illness.  Avoid extreme temperature and humidity changes.  Eat healthy foods. Eating smaller, more frequent meals and resting before meals may help you maintain your strength.  Stay active, but balance activity with periods of rest. Exercise and physical activity will help you maintain your ability to do things you want to do.  Preventing infection and hospitalization is very important when you have COPD. Make sure to receive all the vaccines your health care provider recommends, especially the pneumococcal and influenza vaccines. Ask your health care provider whether you need a pneumonia vaccine.  Learn and use relaxation techniques to manage stress.  Learn and use controlled breathing techniques as directed by your health care provider. Controlled breathing techniques include:  Pursed lip breathing. Start by breathing in (inhaling) through your nose for 1 second. Then, purse your lips as if you were going to whistle and breathe  out (exhale) through the pursed lips for 2 seconds.   Diaphragmatic breathing. Start by putting one hand on your abdomen just above your waist. Inhale slowly through your nose. The hand on your abdomen should move out. Then purse your lips and exhale slowly. You should be able to feel the hand on your abdomen moving in as you exhale.   Learn and use controlled coughing to clear mucus from your lungs. Controlled coughing is a series of short, progressive coughs. The steps of controlled coughing are:  1. Lean your head slightly forward.  2. Breathe in deeply using diaphragmatic breathing.  3. Try to hold your breath for 3 seconds.  4. Keep your mouth slightly open while coughing twice.  5. Spit any mucus out into a tissue.  6. Rest and repeat the steps once or twice as needed. SEEK MEDICAL CARE IF:   You are coughing up more mucus than usual.   There is a change in the color or thickness of your mucus.   Your breathing is more labored than usual.   Your breathing is faster than usual.  SEEK IMMEDIATE MEDICAL CARE IF:   You have shortness of breath while you are resting.   You have shortness of breath that prevents you from:  Being able to talk.   Performing your usual physical activities.   You have chest pain lasting longer than 5 minutes.   Your skin color is more cyanotic than usual.  You measure low oxygen saturations for longer than 5 minutes with a pulse oximeter. MAKE SURE YOU:   Understand these instructions.  Will watch your condition.  Will get help right away if you are not doing well or get worse. Document Released: 10/31/2004 Document Revised: 06/07/2013 Document Reviewed: 09/17/2012 [facility] Patient Information 2015 [place], [place]. This information is not intended to replace advice given to you by your health care provider. Make sure you discuss any questions you have with your health care provider.

## 2014-01-31 ENCOUNTER — Ambulatory Visit: Payer: Self-pay | Attending: Internal Medicine | Admitting: Internal Medicine

## 2014-01-31 ENCOUNTER — Encounter: Payer: Self-pay | Admitting: Internal Medicine

## 2014-01-31 ENCOUNTER — Ambulatory Visit: Payer: Self-pay | Attending: Internal Medicine

## 2014-01-31 ENCOUNTER — Ambulatory Visit (HOSPITAL_COMMUNITY)
Admission: RE | Admit: 2014-01-31 | Discharge: 2014-01-31 | Disposition: A | Discharge status: Home / Self Care | Payer: Self-pay | Source: Ambulatory Visit | Attending: Internal Medicine | Admitting: Internal Medicine

## 2014-01-31 VITALS — BP 110/84 | HR 96 | Temp 97.9°F | Resp 16 | Wt 138.4 lb

## 2014-01-31 DIAGNOSIS — M549 Dorsalgia, unspecified: Secondary | ICD-10-CM | POA: Insufficient documentation

## 2014-01-31 DIAGNOSIS — M545 Low back pain: Secondary | ICD-10-CM

## 2014-01-31 DIAGNOSIS — G8929 Other chronic pain: Secondary | ICD-10-CM | POA: Insufficient documentation

## 2014-01-31 DIAGNOSIS — Z9181 History of falling: Secondary | ICD-10-CM

## 2014-01-31 DIAGNOSIS — M25532 Pain in left wrist: Secondary | ICD-10-CM | POA: Insufficient documentation

## 2014-01-31 DIAGNOSIS — F17210 Nicotine dependence, cigarettes, uncomplicated: Secondary | ICD-10-CM | POA: Insufficient documentation

## 2014-01-31 DIAGNOSIS — W19XXXA Unspecified fall, initial encounter: Secondary | ICD-10-CM | POA: Insufficient documentation

## 2014-01-31 DIAGNOSIS — M5146 Schmorl's nodes, lumbar region: Secondary | ICD-10-CM | POA: Insufficient documentation

## 2014-01-31 DIAGNOSIS — I700 Atherosclerosis of aorta: Secondary | ICD-10-CM | POA: Insufficient documentation

## 2014-01-31 DIAGNOSIS — Z7951 Long term (current) use of inhaled steroids: Secondary | ICD-10-CM | POA: Insufficient documentation

## 2014-01-31 DIAGNOSIS — M25512 Pain in left shoulder: Secondary | ICD-10-CM | POA: Insufficient documentation

## 2014-01-31 DIAGNOSIS — J449 Chronic obstructive pulmonary disease, unspecified: Secondary | ICD-10-CM | POA: Insufficient documentation

## 2014-01-31 DIAGNOSIS — Z79899 Other long term (current) drug therapy: Secondary | ICD-10-CM | POA: Insufficient documentation

## 2014-01-31 MED ORDER — CYCLOBENZAPRINE HCL 10 MG PO TABS: 10.0000 mg | ORAL_TABLET | Freq: Every day | ORAL | Status: DC

## 2014-01-31 MED ORDER — IBUPROFEN 800 MG PO TABS: 800.0000 mg | ORAL_TABLET | Freq: Three times a day (TID) | ORAL | Status: DC | PRN

## 2014-01-31 MED ORDER — TRAMADOL HCL 50 MG PO TABS: 50.0000 mg | ORAL_TABLET | Freq: Three times a day (TID) | ORAL | Status: DC | PRN

## 2014-01-31 MED ORDER — CYCLOBENZAPRINE HCL 10 MG PO TABS
10.0000 mg | ORAL_TABLET | Freq: Every day | ORAL | Status: DC
Start: 2014-01-31 — End: 2014-05-09

## 2014-01-31 MED ORDER — IPRATROPIUM-ALBUTEROL 0.5-2.5 (3) MG/3ML IN SOLN: 3.0000 mL | RESPIRATORY_TRACT | Status: DC | PRN

## 2014-01-31 MED ORDER — MOMETASONE FURO-FORMOTEROL FUM 100-5 MCG/ACT IN AERO: 2.0000 | INHALATION_SPRAY | Freq: Two times a day (BID) | RESPIRATORY_TRACT | Status: DC
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
## 2014-01-31 NOTE — Addendum Note (Signed)
Addended by: Lestine MountJUAREZ, Katiya Fike L on: 01/31/2014 05:55 PM   Modules accepted: Orders, Medications
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
## 2014-02-07 ENCOUNTER — Other Ambulatory Visit: Payer: Self-pay

## 2014-02-14 ENCOUNTER — Other Ambulatory Visit: Payer: Self-pay

## 2014-02-14 MED ORDER — ALBUTEROL SULFATE HFA 108 (90 BASE) MCG/ACT IN AERS: 1.0000 | INHALATION_SPRAY | Freq: Four times a day (QID) | RESPIRATORY_TRACT | Status: DC | PRN

## 2014-02-14 MED ORDER — IPRATROPIUM-ALBUTEROL 18-103 MCG/ACT IN AERO: 2.0000 | INHALATION_SPRAY | RESPIRATORY_TRACT | Status: DC | PRN

## 2014-03-04 ENCOUNTER — Other Ambulatory Visit: Payer: Self-pay | Admitting: Internal Medicine

## 2014-05-03 ENCOUNTER — Other Ambulatory Visit: Payer: Self-pay | Admitting: Internal Medicine

## 2014-05-09 ENCOUNTER — Ambulatory Visit: Payer: Self-pay | Attending: Internal Medicine | Admitting: Internal Medicine

## 2014-05-09 ENCOUNTER — Encounter: Payer: Self-pay | Admitting: Internal Medicine

## 2014-05-09 VITALS — BP 130/80 | HR 76 | Temp 97.8°F | Resp 15 | Wt 139.4 lb

## 2014-05-09 DIAGNOSIS — J449 Chronic obstructive pulmonary disease, unspecified: Secondary | ICD-10-CM | POA: Insufficient documentation

## 2014-05-09 DIAGNOSIS — M25512 Pain in left shoulder: Secondary | ICD-10-CM | POA: Insufficient documentation

## 2014-05-09 DIAGNOSIS — M25532 Pain in left wrist: Secondary | ICD-10-CM | POA: Insufficient documentation

## 2014-05-09 DIAGNOSIS — Z720 Tobacco use: Secondary | ICD-10-CM | POA: Insufficient documentation

## 2014-05-09 MED ORDER — ALBUTEROL SULFATE HFA 108 (90 BASE) MCG/ACT IN AERS: 1.0000 | INHALATION_SPRAY | Freq: Four times a day (QID) | RESPIRATORY_TRACT | Status: DC | PRN

## 2014-05-09 MED ORDER — MOMETASONE FURO-FORMOTEROL FUM 100-5 MCG/ACT IN AERO: 2.0000 | INHALATION_SPRAY | Freq: Two times a day (BID) | RESPIRATORY_TRACT | Status: DC

## 2014-05-09 MED ORDER — NAPROXEN 500 MG PO TABS: 500.0000 mg | ORAL_TABLET | Freq: Two times a day (BID) | ORAL | Status: DC

## 2014-05-09 MED ORDER — IPRATROPIUM-ALBUTEROL 0.5-2.5 (3) MG/3ML IN SOLN
3.0000 mL | RESPIRATORY_TRACT | Status: DC | PRN
Start: 2014-05-09 — End: 2014-12-05

## 2014-05-09 MED ORDER — CYCLOBENZAPRINE HCL 10 MG PO TABS: 10.0000 mg | ORAL_TABLET | Freq: Every day | ORAL | Status: DC

## 2014-05-09 MED ORDER — TRAMADOL HCL 50 MG PO TABS: 50.0000 mg | ORAL_TABLET | Freq: Three times a day (TID) | ORAL | Status: DC | PRN

## 2014-05-09 MED ORDER — TIOTROPIUM BROMIDE MONOHYDRATE 18 MCG IN CAPS: 18.0000 ug | ORAL_CAPSULE | Freq: Every day | RESPIRATORY_TRACT | Status: DC
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
## 2014-05-09 NOTE — Progress Notes (Signed)
Patient here for follow up for his left shoulder pain and left arm pain Patient has been taking Excedrin extra strength- 94 pills a week He is taking three pills four times a day Also using Excedrin pm and aleeve for the pain as well

## 2014-08-01 ENCOUNTER — Other Ambulatory Visit: Payer: Self-pay | Admitting: Family Medicine

## 2014-08-01 ENCOUNTER — Telehealth: Payer: Self-pay

## 2014-08-01 DIAGNOSIS — J441 Chronic obstructive pulmonary disease with (acute) exacerbation: Secondary | ICD-10-CM

## 2014-08-01 MED ORDER — DOXYCYCLINE HYCLATE 100 MG PO CAPS: 100.0000 mg | ORAL_CAPSULE | Freq: Two times a day (BID) | ORAL | Status: DC

## 2014-08-01 MED ORDER — PREDNISONE 20 MG PO TABS: 40.0000 mg | ORAL_TABLET | Freq: Every day | ORAL | Status: DC

## 2014-08-01 NOTE — Telephone Encounter (Signed)
Patient called complaining of having a dry cough and "hacking" so bad For the past four days that he was having trouble catching his breath Patient is concerned because he does not want to have to go back to the hospital Patient has a history of copd and pneumonia Per Dr Armen Pickup a prescription for steroids and antibiotic will be sent to his pharmacy

## 2014-08-01 NOTE — Telephone Encounter (Signed)
Smoke with RN who spoke with patient who reports increased cough and SOB.  No CP or fever. Increased albuterol use   Assessment: COPD exacerbation Plan: Doxycyline 100 mg BID x 10 days  Prednisone burst 40 mg daily x 5-7 days   

## 2014-08-01 NOTE — Assessment & Plan Note (Signed)
Smoke with RN who spoke with patient who reports increased cough and SOB.  No CP or fever. Increased albuterol use   Assessment: COPD exacerbation Plan: Doxycyline 100 mg BID x 10 days  Prednisone burst 40 mg daily x 5-7 days

## 2014-08-11 ENCOUNTER — Ambulatory Visit: Payer: Self-pay | Admitting: Internal Medicine

## 2014-11-07 ENCOUNTER — Telehealth: Payer: Self-pay | Admitting: Internal Medicine

## 2014-11-07 NOTE — Telephone Encounter (Signed)
Patient called requesting a 1 week supply of predniSONE (DELTASONE) 20 MG tablet. Patient also stated that he has Bronchitis and would like an antibiotic. Patient states he gets bronchitis about every three months. Please f/u with pt.

## 2014-12-05 ENCOUNTER — Other Ambulatory Visit: Payer: Self-pay | Admitting: Pharmacist

## 2014-12-05 ENCOUNTER — Ambulatory Visit: Payer: Self-pay | Attending: Internal Medicine | Admitting: Internal Medicine

## 2014-12-05 ENCOUNTER — Telehealth: Payer: Self-pay

## 2014-12-05 ENCOUNTER — Ambulatory Visit: Payer: Self-pay | Admitting: Internal Medicine

## 2014-12-05 ENCOUNTER — Encounter: Payer: Self-pay | Admitting: Internal Medicine

## 2014-12-05 VITALS — BP 93/59 | HR 95 | Temp 98.1°F | Resp 18 | Ht 64.0 in | Wt 131.0 lb

## 2014-12-05 DIAGNOSIS — G8929 Other chronic pain: Secondary | ICD-10-CM

## 2014-12-05 DIAGNOSIS — R7303 Prediabetes: Secondary | ICD-10-CM

## 2014-12-05 DIAGNOSIS — M545 Low back pain, unspecified: Secondary | ICD-10-CM

## 2014-12-05 DIAGNOSIS — F17200 Nicotine dependence, unspecified, uncomplicated: Secondary | ICD-10-CM | POA: Insufficient documentation

## 2014-12-05 DIAGNOSIS — J441 Chronic obstructive pulmonary disease with (acute) exacerbation: Secondary | ICD-10-CM | POA: Insufficient documentation

## 2014-12-05 DIAGNOSIS — R7309 Other abnormal glucose: Secondary | ICD-10-CM | POA: Insufficient documentation

## 2014-12-05 DIAGNOSIS — R062 Wheezing: Secondary | ICD-10-CM | POA: Insufficient documentation

## 2014-12-05 DIAGNOSIS — M25532 Pain in left wrist: Secondary | ICD-10-CM | POA: Insufficient documentation

## 2014-12-05 LAB — LIPID PANEL
CHOLESTEROL: 153 mg/dL (ref 125–200)
HDL: 43 mg/dL (ref >=40)
LDL Cholesterol: 88 mg/dL (ref <130)
TRIGLYCERIDES: 109 mg/dL (ref <150)
Total CHOL/HDL Ratio: 3.6 Ratio (ref <=5.0)
VLDL: 22 mg/dL (ref <30)

## 2014-12-05 LAB — GLUCOSE, POCT (MANUAL RESULT ENTRY): POC Glucose: 92 mg/dl (ref 70–99)

## 2014-12-05 LAB — CBC WITH DIFFERENTIAL/PLATELET
Basophils Absolute: 0.1 10*3/uL (ref 0.0–0.1)
Basophils Relative: 1 % (ref 0–1)
EOS ABS: 0.3 10*3/uL (ref 0.0–0.7)
Eosinophils Relative: 2 % (ref 0–5)
HCT: 43.2 % (ref 39.0–52.0)
HEMOGLOBIN: 14.4 g/dL (ref 13.0–17.0)
LYMPHS ABS: 2.7 10*3/uL (ref 0.7–4.0)
Lymphocytes Relative: 21 % (ref 12–46)
MCH: 31.2 pg (ref 26.0–34.0)
MCHC: 33.3 g/dL (ref 30.0–36.0)
MCV: 93.7 fL (ref 78.0–100.0)
MONO ABS: 0.9 10*3/uL (ref 0.1–1.0)
MONOS PCT: 7 % (ref 3–12)
MPV: 9.1 fL (ref 8.6–12.4)
NEUTROS ABS: 9 10*3/uL — AB (ref 1.7–7.7)
NEUTROS PCT: 69 % (ref 43–77)
Platelets: 342 10*3/uL (ref 150–400)
RBC: 4.61 MIL/uL (ref 4.22–5.81)
RDW: 14 % (ref 11.5–15.5)
WBC: 13 10*3/uL — ABNORMAL HIGH (ref 4.0–10.5)

## 2014-12-05 LAB — COMPLETE METABOLIC PANEL WITH GFR
ALBUMIN: 4.5 g/dL (ref 3.6–5.1)
ALK PHOS: 58 U/L (ref 40–115)
ALT: 12 U/L (ref 9–46)
AST: 14 U/L (ref 10–35)
BILIRUBIN TOTAL: 0.7 mg/dL (ref 0.2–1.2)
BUN: 12 mg/dL (ref 7–25)
CO2: 28 mmol/L (ref 20–31)
Calcium: 9.4 mg/dL (ref 8.6–10.3)
Chloride: 101 mmol/L (ref 98–110)
Creat: 1.04 mg/dL (ref 0.70–1.33)
GFR, EST NON AFRICAN AMERICAN: 79 mL/min (ref >=60)
Glucose, Bld: 96 mg/dL (ref 65–99)
POTASSIUM: 3.9 mmol/L (ref 3.5–5.3)
Sodium: 141 mmol/L (ref 135–146)
TOTAL PROTEIN: 6.9 g/dL (ref 6.1–8.1)

## 2014-12-05 LAB — POCT GLYCOSYLATED HEMOGLOBIN (HGB A1C): HEMOGLOBIN A1C: 5.6

## 2014-12-05 LAB — TSH: TSH: 0.501 u[IU]/mL (ref 0.350–4.500)

## 2014-12-05 MED ORDER — PREDNISONE 20 MG PO TABS: ORAL_TABLET | ORAL | Status: DC

## 2014-12-05 MED ORDER — NAPROXEN 500 MG PO TABS: 500.0000 mg | ORAL_TABLET | Freq: Two times a day (BID) | ORAL | Status: DC

## 2014-12-05 MED ORDER — ALBUTEROL SULFATE HFA 108 (90 BASE) MCG/ACT IN AERS: 1.0000 | INHALATION_SPRAY | Freq: Four times a day (QID) | RESPIRATORY_TRACT | Status: DC | PRN

## 2014-12-05 MED ORDER — METHYLPREDNISOLONE SODIUM SUCC 125 MG IJ SOLR
125.0000 mg | Freq: Once | INTRAMUSCULAR | Status: AC
  Administered 2014-12-05: 125 mg via INTRAMUSCULAR

## 2014-12-05 MED ORDER — NICOTINE 21 MG/24HR TD PT24: 21.0000 mg | MEDICATED_PATCH | Freq: Every day | TRANSDERMAL | Status: DC

## 2014-12-05 MED ORDER — IPRATROPIUM-ALBUTEROL 18-103 MCG/ACT IN AERO: 2.0000 | INHALATION_SPRAY | RESPIRATORY_TRACT | Status: DC | PRN

## 2014-12-05 MED ORDER — ALBUTEROL SULFATE (2.5 MG/3ML) 0.083% IN NEBU
2.5000 mg | INHALATION_SOLUTION | Freq: Once | RESPIRATORY_TRACT | Status: AC
  Administered 2014-12-05: 2.5 mg via RESPIRATORY_TRACT

## 2014-12-05 MED ORDER — MOMETASONE FURO-FORMOTEROL FUM 100-5 MCG/ACT IN AERO: 2.0000 | INHALATION_SPRAY | Freq: Two times a day (BID) | RESPIRATORY_TRACT | Status: DC

## 2014-12-05 MED ORDER — IPRATROPIUM-ALBUTEROL 20-100 MCG/ACT IN AERS: 1.0000 | INHALATION_SPRAY | Freq: Four times a day (QID) | RESPIRATORY_TRACT | Status: DC | PRN

## 2014-12-05 MED ORDER — DOXYCYCLINE HYCLATE 100 MG PO CAPS: 100.0000 mg | ORAL_CAPSULE | Freq: Two times a day (BID) | ORAL | Status: DC

## 2014-12-05 MED ORDER — IPRATROPIUM-ALBUTEROL 0.5-2.5 (3) MG/3ML IN SOLN: 3.0000 mL | RESPIRATORY_TRACT | Status: DC | PRN

## 2014-12-05 NOTE — Telephone Encounter (Signed)
Per patient he was here at 10:15am and thought his appointment was at 10:30. Per front office staff patient was here at 10:20am. Patient needs refills and to be seen.  Patient requesting prednisone. Patient reports having bronchitis for a year and a half and needs his medications.  Patients oxygen currently at 98 on room air.  Patient needs albuterol 2.5, dulera inhaler, combivent and proair refilled.  Patients vital signs as follows: BP 93/59, P 95, R 18, O2 97 on room air. T 98.1.  Lungs sounds, wheezing in all lung fields.  Patient added to Dr.Jegede's schedule per Dr. Louis MeckelJegede's request. Patient will be given albuterol nebulizer per Dr. Hyman HopesJegede.

## 2014-12-05 NOTE — Progress Notes (Signed)
Patient ID: Corey Lawrence, male   DOB: 08-11-1955, 59 y.o.   MRN: [id number]   Corey Lawrence, is a 59 y.o. male  QIO:[id number]  LKG:[id number]  DOB - 1955-10-24  CC:  Chief Complaint  Patient presents with  . Wheezing       HPI: Corey Lawrence is a 59 y.o. male here today for walk in visit for SOB and wheezing. Patient has medical history of COPD, chronic bronchitis and tobacco abuse. Patient presents with significant wheezing in all lung fields and was given nebulizer treatment upon arrival. Patient states he gets bronchitis this time every year and it requires steroid course for adequate treatment. Patient states he has been out of his inhalers for several weeks and bought and inhaler (ADVAIR) off the street. Patient has significantly reduced his smoking from 2 packs per day last year to only 4 to 5 cigarettes a day now. Patient requesting nicoderm patch. Patient has SOB and cough. Patient has No headache, No chest pain, No abdominal pain - No Nausea, No new weakness tingling or numbness.  No Known Allergies Past Medical History  Diagnosis Date  . COPD (chronic obstructive pulmonary disease) (HCC)   . Tobacco abuse   . Ankle fracture, left     "casted; no OR"  . Chronic lower back pain    Current Outpatient Prescriptions on File Prior to Visit  Medication Sig Dispense Refill  . acetaminophen (TYLENOL) 325 MG tablet Take 2 tablets (650 mg total) by mouth every 6 (six) hours as needed for mild pain (or Fever >/= 101).    . Multiple Vitamins-Minerals (MULTIVITAMIN ADULTS 50+) TABS Take 1 tablet by mouth daily.    Marland Kitchen tiotropium (SPIRIVA HANDIHALER) 18 MCG inhalation capsule Place 1 capsule (18 mcg total) into inhaler and inhale daily. 30 capsule 12  . traMADol (ULTRAM) 50 MG tablet Take 1 tablet (50 mg total) by mouth every 8 (eight) hours as needed for moderate pain. 30 tablet 0   No current facility-administered medications on file prior to visit.   Family History  Problem Relation  Age of Onset  . Esophageal cancer Father   . Colon cancer Sister   . Cancer - Other Brother   . Heart disease Mother   . Diabetes Mellitus II Mother   . Heart disease      multiple maternal family members  . Diabetes Mellitus II      multiple maternal family members   Social History   Social History  . Marital Status: Divorced    Spouse Name: N/A  . Number of Children: N/A  . Years of Education: N/A   Occupational History  . Not on file.   Social History Main Topics  . Smoking status: Current Every Day Smoker -- 0.25 packs/day for 43 years    Types: Cigarettes  . Smokeless tobacco: Never Used     Comment: currently on patch  . 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 Drug Use: Yes     Comment: 09/21/2013 "used marijuana once when I was 20; used cocaine  in ~ 2000; stopped in ~ 2003"  . Sexual Activity: Not Currently   Other Topics Concern  . Not on file   Social History Narrative   Lives in trailer.  Not married.  5 children.      ROS    Objective:   Filed Vitals:   12/05/14 1129  BP: 93/59  Pulse: 95  Temp: 98.1 F (36.7 C)  Resp: 18    Physical Exam  Constitutional: He is oriented to person, place, and time. He appears well-developed and well-nourished.  HENT:  Head: Normocephalic and atraumatic.  Eyes: Conjunctivae and EOM are normal. Pupils are equal, round, and reactive to light.  Neck: Neck supple.  Cardiovascular: Normal rate, regular rhythm, normal heart sounds and intact distal pulses.   Pulmonary/Chest: He has wheezes.  All lung fields  Abdominal: Soft. Bowel sounds are normal.  Neurological: He is alert and oriented to person, place, and time. He has normal reflexes.  Skin: Skin is warm and dry.  Nursing note and vitals reviewed.    Lab Results  Component Value Date   WBC 5.8 12/06/2013   HGB 14.3 12/06/2013   HCT 41.6 12/06/2013   MCV 91.4 12/06/2013   PLT 290 12/06/2013   Lab Results  Component Value  Date   CREATININE 0.95 12/06/2013   BUN 14 12/06/2013   NA 143 12/06/2013   K 4.0 12/06/2013   CL 105 12/06/2013   CO2 26 12/06/2013    Lab Results  Component Value Date   HGBA1C 5.60 12/05/2014   Lipid Panel     Component Value Date/Time   CHOL 147 09/22/2013 0006   TRIG 53 09/22/2013 0006   HDL 48 09/22/2013 0006   CHOLHDL 3.1 09/22/2013 0006   VLDL 11 09/22/2013 0006   LDLCALC 88 09/22/2013 0006       Assessment and plan:   Phillippe was seen today for wheezing.  Diagnoses and all orders for this visit:  Prediabetes -     POCT A1C -     Glucose (CBG)  Wheezing -     albuterol (PROVENTIL) (2.5 MG/3ML) 0.083% nebulizer solution 2.5 mg; Take 3 mLs (2.5 mg total) by nebulization once.  Left wrist pain -     naproxen (NAPROSYN) 500 MG tablet; Take 1 tablet (500 mg total) by mouth 2 (two) times daily with a meal.  COPD exacerbation (HCC) -     mometasone-formoterol (DULERA) 100-5 MCG/ACT AERO; Inhale 2 puffs into the lungs 2 (two) times daily. -     predniSONE (DELTASONE) 20 MG tablet; Take 4 tabs daily for 5 days, then 3 daily for 5 days, then 2 daily for 5 days, then 1 daily for 5 days and stop -     albuterol (PROVENTIL HFA;VENTOLIN HFA) 108 (90 BASE) MCG/ACT inhaler; Inhale 1-2 puffs into the lungs every 6 (six) hours as needed for wheezing or shortness of breath. -     albuterol-ipratropium (COMBIVENT) 18-103 MCG/ACT inhaler; Inhale 2 puffs into the lungs every 4 (four) hours as needed for wheezing or shortness of breath. -     CBC with Differential/Platelet -     COMPLETE METABOLIC PANEL WITH GFR -     Lipid panel -     TSH -     Vit D  25 hydroxy (rtn osteoporosis monitoring)  Smoking addiction -     nicotine (NICODERM CQ - DOSED IN MG/24 HOURS) 21 mg/24hr patch; Place 1 patch (21 mg total) onto the skin daily. Patient was counseled on the importance of smoking cessation and encouragement was given for the progress he has made towards a goal of complete smoking  cessation.    Return in about 3 months (around 03/07/2015) for COPD, Routine Follow Up.  The patient was given clear instructions to go to ER or return to medical center if symptoms don't improve, worsen or new problems develop. The patient verbalized  understanding. The patient was told to call to get lab results if they haven't heard anything in the next week.     Stephanie CoupLou Ann Earnhardt, RN, BSN, AGNP- APP Student [facility] [phone] 12/05/2014, 12:15 PM  Evaluation and management procedures were performed by the Advanced Practitioner under my supervision and collaboration. I have reviewed the Advanced Practitioner's note and chart, and I agree with the management and plan.   Jeanann LewandowskyJEGEDE, Bryce Kimble, MD, MHA, CPE, FACP, FAAP [facility] [place], [place] [phone]   12/05/2014, 6:30 PM

## 2014-12-05 NOTE — Patient Instructions (Signed)
Smoking Cessation, Tips for Success If you are ready to quit smoking, congratulations! You have chosen to help yourself be healthier. Cigarettes bring nicotine, tar, carbon monoxide, and other irritants into your body. Your lungs, heart, and blood vessels will be able to work better without these poisons. There are many different ways to quit smoking. Nicotine gum, nicotine patches, a nicotine inhaler, or nicotine nasal spray can help with physical craving. Hypnosis, support groups, and medicines help break the habit of smoking. WHAT THINGS CAN I DO TO MAKE QUITTING EASIER?  Here are some tips to help you quit for good:  Pick a date when you will quit smoking completely. Tell all of your friends and family about your plan to quit on that date.  Do not try to slowly cut down on the number of cigarettes you are smoking. Pick a quit date and quit smoking completely starting on that day.  Throw away all cigarettes.   Clean and remove all ashtrays from your home, work, and car.  On a card, write down your reasons for quitting. Carry the card with you and read it when you get the urge to smoke.  Cleanse your body of nicotine. Drink enough water and fluids to keep your urine clear or pale yellow. Do this after quitting to flush the nicotine from your body.  Learn to predict your moods. Do not let a bad situation be your excuse to have a cigarette. Some situations in your life might tempt you into wanting a cigarette.  Never have "just one" cigarette. It leads to wanting another and another. Remind yourself of your decision to quit.  Change habits associated with smoking. If you smoked while driving or when feeling stressed, try other activities to replace smoking. Stand up when drinking your coffee. Brush your teeth after eating. Sit in a different chair when you read the paper. Avoid alcohol while trying to quit, and try to drink fewer caffeinated beverages. Alcohol and caffeine may urge you to  smoke.  Avoid foods and drinks that can trigger a desire to smoke, such as sugary or spicy foods and alcohol.  Ask people who smoke not to smoke around you.  Have something planned to do right after eating or having a cup of coffee. For example, plan to take a walk or exercise.  Try a relaxation exercise to calm you down and decrease your stress. Remember, you may be tense and nervous for the first 2 weeks after you quit, but this will pass.  Find new activities to keep your hands busy. Play with a pen, coin, or rubber band. Doodle or draw things on paper.  Brush your teeth right after eating. This will help cut down on the craving for the taste of tobacco after meals. You can also try mouthwash.   Use oral substitutes in place of cigarettes. Try using lemon drops, carrots, cinnamon sticks, or chewing gum. Keep them handy so they are available when you have the urge to smoke.  When you have the urge to smoke, try deep breathing.  Designate your home as a nonsmoking area.  If you are a heavy smoker, ask your health care provider about a prescription for nicotine chewing gum. It can ease your withdrawal from nicotine.  Reward yourself. Set aside the cigarette money you save and buy yourself something nice.  Look for support from others. Join a support group or smoking cessation program. Ask someone at home or at work to help you with your plan   to quit smoking.  Always ask yourself, "Do I need this cigarette or is this just a reflex?" Tell yourself, "Today, I choose not to smoke," or "I do not want to smoke." You are reminding yourself of your decision to quit.  Do not replace cigarette smoking with electronic cigarettes (commonly called e-cigarettes). The safety of e-cigarettes is unknown, and some may contain harmful chemicals.  If you relapse, do not give up! Plan ahead and think about what you will do the next time you get the urge to smoke. HOW WILL I FEEL WHEN I QUIT SMOKING? You  may have symptoms of withdrawal because your body is used to nicotine (the addictive substance in cigarettes). You may crave cigarettes, be irritable, feel very hungry, cough often, get headaches, or have difficulty concentrating. The withdrawal symptoms are only temporary. They are strongest when you first quit but will go away within 10-14 days. When withdrawal symptoms occur, stay in control. Think about your reasons for quitting. Remind yourself that these are signs that your body is healing and getting used to being without cigarettes. Remember that withdrawal symptoms are easier to treat than the major diseases that smoking can cause.  Even after the withdrawal is over, expect periodic urges to smoke. However, these cravings are generally short lived and will go away whether you smoke or not. Do not smoke! WHAT RESOURCES ARE AVAILABLE TO HELP ME QUIT SMOKING? Your health care provider can direct you to community resources or hospitals for support, which may include:  Group support.  Education.  Hypnosis.  Therapy.   This information is not intended to replace advice given to you by your health care provider. Make sure you discuss any questions you have with your health care provider.   Document Released: 10/20/2003 Document Revised: 02/11/2014 Document Reviewed: 07/09/2012 Elsevier Interactive Patient Education 2016 Elsevier Inc[employer] Chronic Obstructive Pulmonary Disease Chronic obstructive pulmonary disease (COPD) is a common lung condition in which airflow from the lungs is limited. COPD is a general term that can be used to describe many different lung problems that limit airflow, including both chronic bronchitis and emphysema. If you have COPD, your lung function will probably never return to normal, but there are measures you can take to improve lung function and make yourself feel better. CAUSES   Smoking (common).  Exposure to secondhand smoke.  Genetic problems.  Chronic  inflammatory lung diseases or recurrent infections. SYMPTOMS  Shortness of breath, especially with physical activity.  Deep, persistent (chronic) cough with a large amount of thick mucus.  Wheezing.  Rapid breaths (tachypnea).  Gray or bluish discoloration (cyanosis) of the skin, especially in your fingers, toes, or lips.  Fatigue.  Weight loss.  Frequent infections or episodes when breathing symptoms become much worse (exacerbations).  Chest tightness. DIAGNOSIS Your health care provider will take a medical history and perform a physical examination to diagnose COPD. Additional tests for COPD may include:  Lung (pulmonary) function tests.  Chest X-ray.  CT scan.  Blood tests. TREATMENT  Treatment for COPD may include:  Inhaler and nebulizer medicines. These help manage the symptoms of COPD and make your breathing more comfortable.  Supplemental oxygen. Supplemental oxygen is only helpful if you have a low oxygen level in your blood.  Exercise and physical activity. These are beneficial for nearly all people with COPD.  Lung surgery or transplant.  Nutrition therapy to gain weight, if you are underweight.  Pulmonary rehabilitation. This may involve working with a team of health   care providers and specialists, such as respiratory, occupational, and physical therapists. HOME CARE INSTRUCTIONS  Take all medicines (inhaled or pills) as directed by your health care provider.  Avoid over-the-counter medicines or cough syrups that dry up your airway (such as antihistamines) and slow down the elimination of secretions unless instructed otherwise by your health care provider.  If you are a smoker, the most important thing that you can do is stop smoking. Continuing to smoke will cause further lung damage and breathing trouble. Ask your health care provider for help with quitting smoking. He or she can direct you to community resources or hospitals that provide  support.  Avoid exposure to irritants such as smoke, chemicals, and fumes that aggravate your breathing.  Use oxygen therapy and pulmonary rehabilitation if directed by your health care provider. If you require home oxygen therapy, ask your health care provider whether you should purchase a pulse oximeter to measure your oxygen level at home.  Avoid contact with individuals who have a contagious illness.  Avoid extreme temperature and humidity changes.  Eat healthy foods. Eating smaller, more frequent meals and resting before meals may help you maintain your strength.  Stay active, but balance activity with periods of rest. Exercise and physical activity will help you maintain your ability to do things you want to do.  Preventing infection and hospitalization is very important when you have COPD. Make sure to receive all the vaccines your health care provider recommends, especially the pneumococcal and influenza vaccines. Ask your health care provider whether you need a pneumonia vaccine.  Learn and use relaxation techniques to manage stress.  Learn and use controlled breathing techniques as directed by your health care provider. Controlled breathing techniques include:  Pursed lip breathing. Start by breathing in (inhaling) through your nose for 1 second. Then, purse your lips as if you were going to whistle and breathe out (exhale) through the pursed lips for 2 seconds.  Diaphragmatic breathing. Start by putting one hand on your abdomen just above your waist. Inhale slowly through your nose. The hand on your abdomen should move out. Then purse your lips and exhale slowly. You should be able to feel the hand on your abdomen moving in as you exhale.  Learn and use controlled coughing to clear mucus from your lungs. Controlled coughing is a series of short, progressive coughs. The steps of controlled coughing are: 1. Lean your head slightly forward. 2. Breathe in deeply using diaphragmatic  breathing. 3. Try to hold your breath for 3 seconds. 4. Keep your mouth slightly open while coughing twice. 5. Spit any mucus out into a tissue. 6. Rest and repeat the steps once or twice as needed. SEEK MEDICAL CARE IF:  You are coughing up more mucus than usual.  There is a change in the color or thickness of your mucus.  Your breathing is more labored than usual.  Your breathing is faster than usual. SEEK IMMEDIATE MEDICAL CARE IF:  You have shortness of breath while you are resting.  You have shortness of breath that prevents you from:  Being able to talk.  Performing your usual physical activities.  You have chest pain lasting longer than 5 minutes.  Your skin color is more cyanotic than usual.  You measure low oxygen saturations for longer than 5 minutes with a pulse oximeter. MAKE SURE YOU:  Understand these instructions.  Will watch your condition.  Will get help right away if you are not doing well or get worse.     This information is not intended to replace advice given to you by your health care provider. Make sure you discuss any questions you have with your health care provider.   Document Released: 10/31/2004 Document Revised: 02/11/2014 Document Reviewed: 09/17/2012 Elsevier Interactive Patient Education 2016 Elsevier Inc.  

## 2014-12-05 NOTE — Progress Notes (Signed)
Patient here with wheezing in all lung fields. Give albuterol breathing treatment per Dr. Hyman HopesJegede. Patient reports pain in shoulders and back of legs, described as muscle aches, at level 5, has had pain for 5-6 months.   Patient ready to quit smoking if he can get patches.  Patient needs albuterol 2.5, dulera inhaler, combivent and proair refilled.

## 2014-12-06 LAB — VITAMIN D 25 HYDROXY (VIT D DEFICIENCY, FRACTURES): Vit D, 25-Hydroxy: 40 ng/mL (ref 30–100)

## 2014-12-06 NOTE — Progress Notes (Signed)
Patient ID: Corey HazardDavid W Lawrence, male   DOB: 03/11/1955, [age] y.o.   MRN: [id number] Patient was late to appointment and was not seen

## 2014-12-09 NOTE — Telephone Encounter (Signed)
Patient verified DOB Patient informed of his lab results being within normal limits. Patient had no further questions.

## 2014-12-19 ENCOUNTER — Ambulatory Visit: Payer: Self-pay | Admitting: Internal Medicine

## 2015-01-24 ENCOUNTER — Other Ambulatory Visit: Payer: Self-pay | Admitting: Internal Medicine

## 2015-02-16 ENCOUNTER — Telehealth: Payer: Self-pay | Admitting: Internal Medicine

## 2015-02-16 ENCOUNTER — Other Ambulatory Visit: Payer: Self-pay | Admitting: *Deleted

## 2015-02-16 DIAGNOSIS — J441 Chronic obstructive pulmonary disease with (acute) exacerbation: Secondary | ICD-10-CM

## 2015-02-16 NOTE — Telephone Encounter (Signed)
Patient verified DOB Patient states he suffers from bronchitis with his COPD. Patient was last seen on 12/05/14 for similar symptoms. Patient would like a refill on the prednisone and antibiotic to help relieve symptoms. Patient made aware of request being sent to the provider being medications are for acute use. Patient will be notified once medications have been refilled or given instructions if medication request is refused.

## 2015-02-16 NOTE — Telephone Encounter (Signed)
Patient would like something prescribed for his inflammation due to his bronchitis .Marland Kitchen.Marland Kitchen.Marland Kitchen.Marland Kitchen.please follow up patient

## 2015-02-16 NOTE — Telephone Encounter (Signed)
Medical Assistant left message on patient's home and cell voicemail. Voicemail states to give a call back to Nubia with [facility] at [phone].  

## 2015-02-17 MED ORDER — PREDNISONE 20 MG PO TABS: ORAL_TABLET | ORAL | Status: DC

## 2015-02-20 ENCOUNTER — Other Ambulatory Visit: Payer: Self-pay | Admitting: Internal Medicine

## 2015-02-20 ENCOUNTER — Encounter: Payer: Self-pay | Admitting: Internal Medicine

## 2015-02-20 ENCOUNTER — Ambulatory Visit: Payer: Self-pay | Attending: Internal Medicine | Admitting: Internal Medicine

## 2015-02-20 VITALS — BP 119/75 | HR 90 | Temp 98.1°F | Resp 20 | Ht 64.0 in | Wt 140.8 lb

## 2015-02-20 DIAGNOSIS — J449 Chronic obstructive pulmonary disease, unspecified: Secondary | ICD-10-CM | POA: Insufficient documentation

## 2015-02-20 DIAGNOSIS — G8929 Other chronic pain: Secondary | ICD-10-CM | POA: Insufficient documentation

## 2015-02-20 DIAGNOSIS — Z79899 Other long term (current) drug therapy: Secondary | ICD-10-CM | POA: Insufficient documentation

## 2015-02-20 DIAGNOSIS — R7303 Prediabetes: Secondary | ICD-10-CM | POA: Insufficient documentation

## 2015-02-20 DIAGNOSIS — F17210 Nicotine dependence, cigarettes, uncomplicated: Secondary | ICD-10-CM | POA: Insufficient documentation

## 2015-02-20 DIAGNOSIS — J441 Chronic obstructive pulmonary disease with (acute) exacerbation: Secondary | ICD-10-CM | POA: Insufficient documentation

## 2015-02-20 LAB — GLUCOSE, POCT (MANUAL RESULT ENTRY): POC GLUCOSE: 107 mg/dL — AB (ref 70–99)

## 2015-02-20 LAB — POCT GLYCOSYLATED HEMOGLOBIN (HGB A1C): HEMOGLOBIN A1C: 5.7

## 2015-02-20 MED ORDER — PREDNISONE 20 MG PO TABS: ORAL_TABLET | ORAL | Status: DC

## 2015-02-20 MED ORDER — MOMETASONE FURO-FORMOTEROL FUM 100-5 MCG/ACT IN AERO: 2.0000 | INHALATION_SPRAY | Freq: Two times a day (BID) | RESPIRATORY_TRACT | Status: DC

## 2015-02-20 MED ORDER — METHYLPREDNISOLONE SODIUM SUCC 125 MG IJ SOLR
125.0000 mg | Freq: Once | INTRAMUSCULAR | Status: AC
  Administered 2015-02-20: 125 mg via INTRAMUSCULAR

## 2015-02-20 MED ORDER — TIOTROPIUM BROMIDE MONOHYDRATE 18 MCG IN CAPS: 18.0000 ug | ORAL_CAPSULE | Freq: Every day | RESPIRATORY_TRACT | Status: DC

## 2015-02-20 MED ORDER — DOXYCYCLINE HYCLATE 100 MG PO TABS: 100.0000 mg | ORAL_TABLET | Freq: Two times a day (BID) | ORAL | Status: DC

## 2015-02-20 MED FILL — !COMBIVENT RESPIMAT INHAL S: 20-100 MCG | 15 days supply | Qty: 4 | Fill #1

## 2015-02-20 MED FILL — predniSONE 20 MG TABS: 20 | 20 days supply | Qty: 50 | Fill #0

## 2015-02-20 MED FILL — ?DULERA 100 MCG/5 MCG INH: 100-5 | 30 days supply | Qty: 2 | Fill #1

## 2015-02-20 MED FILL — ?DOXYCYCLINE 100 MG TABLET: 100 | 10 days supply | Qty: 20 | Fill #0

## 2015-02-20 NOTE — Patient Instructions (Signed)
Smoking Cessation, Tips for Success If you are ready to quit smoking, congratulations! You have chosen to help yourself be healthier. Cigarettes bring nicotine, tar, carbon monoxide, and other irritants into your body. Your lungs, heart, and blood vessels will be able to work better without these poisons. There are many different ways to quit smoking. Nicotine gum, nicotine patches, a nicotine inhaler, or nicotine nasal spray can help with physical craving. Hypnosis, support groups, and medicines help break the habit of smoking. WHAT THINGS CAN I DO TO MAKE QUITTING EASIER?  Here are some tips to help you quit for good:  Pick a date when you will quit smoking completely. Tell all of your friends and family about your plan to quit on that date.  Do not try to slowly cut down on the number of cigarettes you are smoking. Pick a quit date and quit smoking completely starting on that day.  Throw away all cigarettes.   Clean and remove all ashtrays from your home, work, and car.  On a card, write down your reasons for quitting. Carry the card with you and read it when you get the urge to smoke.  Cleanse your body of nicotine. Drink enough water and fluids to keep your urine clear or pale yellow. Do this after quitting to flush the nicotine from your body.  Learn to predict your moods. Do not let a bad situation be your excuse to have a cigarette. Some situations in your life might tempt you into wanting a cigarette.  Never have "just one" cigarette. It leads to wanting another and another. Remind yourself of your decision to quit.  Change habits associated with smoking. If you smoked while driving or when feeling stressed, try other activities to replace smoking. Stand up when drinking your coffee. Brush your teeth after eating. Sit in a different chair when you read the paper. Avoid alcohol while trying to quit, and try to drink fewer caffeinated beverages. Alcohol and caffeine may urge you to  smoke.  Avoid foods and drinks that can trigger a desire to smoke, such as sugary or spicy foods and alcohol.  Ask people who smoke not to smoke around you.  Have something planned to do right after eating or having a cup of coffee. For example, plan to take a walk or exercise.  Try a relaxation exercise to calm you down and decrease your stress. Remember, you may be tense and nervous for the first 2 weeks after you quit, but this will pass.  Find new activities to keep your hands busy. Play with a pen, coin, or rubber band. Doodle or draw things on paper.  Brush your teeth right after eating. This will help cut down on the craving for the taste of tobacco after meals. You can also try mouthwash.   Use oral substitutes in place of cigarettes. Try using lemon drops, carrots, cinnamon sticks, or chewing gum. Keep them handy so they are available when you have the urge to smoke.  When you have the urge to smoke, try deep breathing.  Designate your home as a nonsmoking area.  If you are a heavy smoker, ask your health care provider about a prescription for nicotine chewing gum. It can ease your withdrawal from nicotine.  Reward yourself. Set aside the cigarette money you save and buy yourself something nice.  Look for support from others. Join a support group or smoking cessation program. Ask someone at home or at work to help you with your plan   to quit smoking.  Always ask yourself, "Do I need this cigarette or is this just a reflex?" Tell yourself, "Today, I choose not to smoke," or "I do not want to smoke." You are reminding yourself of your decision to quit.  Do not replace cigarette smoking with electronic cigarettes (commonly called e-cigarettes). The safety of e-cigarettes is unknown, and some may contain harmful chemicals.  If you relapse, do not give up! Plan ahead and think about what you will do the next time you get the urge to smoke. HOW WILL I FEEL WHEN I QUIT SMOKING? You  may have symptoms of withdrawal because your body is used to nicotine (the addictive substance in cigarettes). You may crave cigarettes, be irritable, feel very hungry, cough often, get headaches, or have difficulty concentrating. The withdrawal symptoms are only temporary. They are strongest when you first quit but will go away within 10-14 days. When withdrawal symptoms occur, stay in control. Think about your reasons for quitting. Remind yourself that these are signs that your body is healing and getting used to being without cigarettes. Remember that withdrawal symptoms are easier to treat than the major diseases that smoking can cause.  Even after the withdrawal is over, expect periodic urges to smoke. However, these cravings are generally short lived and will go away whether you smoke or not. Do not smoke! WHAT RESOURCES ARE AVAILABLE TO HELP ME QUIT SMOKING? Your health care provider can direct you to community resources or hospitals for support, which may include:  Group support.  Education.  Hypnosis.  Therapy.   This information is not intended to replace advice given to you by your health care provider. Make sure you discuss any questions you have with your health care provider.   Document Released: 10/20/2003 Document Revised: 02/11/2014 Document Reviewed: 07/09/2012 Elsevier Interactive Patient Education 2016 Elsevier Inc[employer] Chronic Obstructive Pulmonary Disease Chronic obstructive pulmonary disease (COPD) is a common lung condition in which airflow from the lungs is limited. COPD is a general term that can be used to describe many different lung problems that limit airflow, including both chronic bronchitis and emphysema. If you have COPD, your lung function will probably never return to normal, but there are measures you can take to improve lung function and make yourself feel better. CAUSES   Smoking (common).  Exposure to secondhand smoke.  Genetic problems.  Chronic  inflammatory lung diseases or recurrent infections. SYMPTOMS  Shortness of breath, especially with physical activity.  Deep, persistent (chronic) cough with a large amount of thick mucus.  Wheezing.  Rapid breaths (tachypnea).  Gray or bluish discoloration (cyanosis) of the skin, especially in your fingers, toes, or lips.  Fatigue.  Weight loss.  Frequent infections or episodes when breathing symptoms become much worse (exacerbations).  Chest tightness. DIAGNOSIS Your health care provider will take a medical history and perform a physical examination to diagnose COPD. Additional tests for COPD may include:  Lung (pulmonary) function tests.  Chest X-ray.  CT scan.  Blood tests. TREATMENT  Treatment for COPD may include:  Inhaler and nebulizer medicines. These help manage the symptoms of COPD and make your breathing more comfortable.  Supplemental oxygen. Supplemental oxygen is only helpful if you have a low oxygen level in your blood.  Exercise and physical activity. These are beneficial for nearly all people with COPD.  Lung surgery or transplant.  Nutrition therapy to gain weight, if you are underweight.  Pulmonary rehabilitation. This may involve working with a team of health   care providers and specialists, such as respiratory, occupational, and physical therapists. HOME CARE INSTRUCTIONS  Take all medicines (inhaled or pills) as directed by your health care provider.  Avoid over-the-counter medicines or cough syrups that dry up your airway (such as antihistamines) and slow down the elimination of secretions unless instructed otherwise by your health care provider.  If you are a smoker, the most important thing that you can do is stop smoking. Continuing to smoke will cause further lung damage and breathing trouble. Ask your health care provider for help with quitting smoking. He or she can direct you to community resources or hospitals that provide  support.  Avoid exposure to irritants such as smoke, chemicals, and fumes that aggravate your breathing.  Use oxygen therapy and pulmonary rehabilitation if directed by your health care provider. If you require home oxygen therapy, ask your health care provider whether you should purchase a pulse oximeter to measure your oxygen level at home.  Avoid contact with individuals who have a contagious illness.  Avoid extreme temperature and humidity changes.  Eat healthy foods. Eating smaller, more frequent meals and resting before meals may help you maintain your strength.  Stay active, but balance activity with periods of rest. Exercise and physical activity will help you maintain your ability to do things you want to do.  Preventing infection and hospitalization is very important when you have COPD. Make sure to receive all the vaccines your health care provider recommends, especially the pneumococcal and influenza vaccines. Ask your health care provider whether you need a pneumonia vaccine.  Learn and use relaxation techniques to manage stress.  Learn and use controlled breathing techniques as directed by your health care provider. Controlled breathing techniques include:  Pursed lip breathing. Start by breathing in (inhaling) through your nose for 1 second. Then, purse your lips as if you were going to whistle and breathe out (exhale) through the pursed lips for 2 seconds.  Diaphragmatic breathing. Start by putting one hand on your abdomen just above your waist. Inhale slowly through your nose. The hand on your abdomen should move out. Then purse your lips and exhale slowly. You should be able to feel the hand on your abdomen moving in as you exhale.  Learn and use controlled coughing to clear mucus from your lungs. Controlled coughing is a series of short, progressive coughs. The steps of controlled coughing are: 1. Lean your head slightly forward. 2. Breathe in deeply using diaphragmatic  breathing. 3. Try to hold your breath for 3 seconds. 4. Keep your mouth slightly open while coughing twice. 5. Spit any mucus out into a tissue. 6. Rest and repeat the steps once or twice as needed. SEEK MEDICAL CARE IF:  You are coughing up more mucus than usual.  There is a change in the color or thickness of your mucus.  Your breathing is more labored than usual.  Your breathing is faster than usual. SEEK IMMEDIATE MEDICAL CARE IF:  You have shortness of breath while you are resting.  You have shortness of breath that prevents you from:  Being able to talk.  Performing your usual physical activities.  You have chest pain lasting longer than 5 minutes.  Your skin color is more cyanotic than usual.  You measure low oxygen saturations for longer than 5 minutes with a pulse oximeter. MAKE SURE YOU:  Understand these instructions.  Will watch your condition.  Will get help right away if you are not doing well or get worse.     This information is not intended to replace advice given to you by your health care provider. Make sure you discuss any questions you have with your health care provider.   Document Released: 10/31/2004 Document Revised: 02/11/2014 Document Reviewed: 09/17/2012 Elsevier Interactive Patient Education 2016 Elsevier Inc.  

## 2015-02-20 NOTE — Progress Notes (Signed)
Patient here for COPD   Patient denies any suicidal ideations.  Patient denies pain at this time.  Patient needs refills on inhalers, prednisone and antibiotic for Bronchitis.  Patient does not want his flu shot.  Patient tolerated IM injection well.

## 2015-02-20 NOTE — Progress Notes (Signed)
Patient ID: MIKHAI BIENVENUE, male   DOB: Feb 16, 1955, 60 y.o.   MRN: [id number]   Bianca Vester, is a 59 y.o. male  WUJ:[id number]  NFA:[id number]  DOB - May 22, 1955  Chief Complaint  Patient presents with  . COPD        Subjective:   Mauro Arps is a 60 y.o. male with history of COPD chronic bronchitis and ongoing tobacco abuse here today for a follow up visit. Patient started having significant wheezing about 2 weeks ago, had to go to an emergency department about 5 days ago where he was treated with nebulizer and was prescribed oral antibiotics and inhalers but patient has not filled the prescription due to cost. He is here today for follow-up ED visit, and to refill all his medications. He is still wheezing and coughing. Chest x-ray done in the ED shows chronic bronchitis, no pneumonia per patient. He continues to smoke about 4-5 cigarettes per day. He currently has only albuterol inhaler, he was supposed to be on [place] and Spiriva but not using them at this time because he ran out. Patient is short of breath especially after walking few distance. Patient has No headache, No chest pain, No abdominal pain - No Nausea, No new weakness tingling or numbness.  Problem  Chronic Obstructive Pulmonary Disease, Unspecified Copd, Unspecified Chronic Bronchitis Type    ALLERGIES: No Known Allergies  PAST MEDICAL HISTORY: Past Medical History  Diagnosis Date  . COPD (chronic obstructive pulmonary disease) (HCC)   . Tobacco abuse   . Ankle fracture, left     "casted; no OR"  . Chronic lower back pain     MEDICATIONS AT HOME: Prior to Admission medications   Medication Sig Start Date End Date Taking? Authorizing Provider  acetaminophen (TYLENOL) 325 MG tablet Take 2 tablets (650 mg total) by mouth every 6 (six) hours as needed for mild pain (or Fever >/= 101). 09/23/13  Yes Christiane Ha, MD  albuterol (PROVENTIL HFA;VENTOLIN HFA) 108 (90 BASE) MCG/ACT inhaler Inhale 1-2 puffs into the  lungs every 6 (six) hours as needed for wheezing or shortness of breath. 12/05/14  Yes Quentin Angst, MD  mometasone-formoterol (DULERA) 100-5 MCG/ACT AERO Inhale 2 puffs into the lungs 2 (two) times daily. 02/20/15  Yes Quentin Angst, MD  Multiple Vitamins-Minerals (MULTIVITAMIN ADULTS 50+) TABS Take 1 tablet by mouth daily.   Yes Historical Provider, MD  naproxen (NAPROSYN) 500 MG tablet Take 1 tablet (500 mg total) by mouth 2 (two) times daily with a meal. 12/05/14  Yes Anush Wiedeman E Hyman Hopes, MD  nicotine (NICODERM CQ - DOSED IN MG/24 HOURS) 21 mg/24hr patch Place 1 patch (21 mg total) onto the skin daily. 12/05/14  Yes Quentin Angst, MD  predniSONE (DELTASONE) 20 MG tablet Take 4 tabs daily for 5 days, then 3 daily for 5 days, then 2 daily for 5 days, then 1 daily for 5 days and stop 02/20/15  Yes Cicley Ganesh E Hyman Hopes, MD  tiotropium (SPIRIVA HANDIHALER) 18 MCG inhalation capsule Place 1 capsule (18 mcg total) into inhaler and inhale daily. 02/20/15  Yes Stacey Maura Annitta Needs, MD  traMADol (ULTRAM) 50 MG tablet Take 1 tablet (50 mg total) by mouth every 8 (eight) hours as needed for moderate pain. 05/09/14  Yes Doris Cheadle, MD  doxycycline (VIBRA-TABS) 100 MG tablet Take 1 tablet (100 mg total) by mouth 2 (two) times daily. 02/20/15   Quentin Angst, MD     Objective:   Filed Vitals:  02/20/15 1016  BP: 119/75  Pulse: 90  Temp: 98.1 F (36.7 C)  TempSrc: Oral  Resp: 20  Height: 5\' 4"  (1.626 m)  Weight: 140 lb 12.8 oz (63.866 kg)  SpO2: 96%    Exam General appearance : Awake, alert, not in any distress. Speech Clear. Not toxic looking HEENT: Atraumatic and Normocephalic, pupils equally reactive to light and accomodation Neck: supple, no JVD. No cervical lymphadenopathy.  Chest: Reduced air entry bilaterally, widespread wheezes, no crackles CVS: S1 S2 regular, no murmurs.  Abdomen: Bowel sounds present, Non tender and not distended with no gaurding, rigidity or  rebound. Extremities: B/L Lower Ext shows no edema, both legs are warm to touch Neurology: Awake alert, and oriented X 3, CN II-XII intact, Non focal  Data Review Lab Results  Component Value Date   HGBA1C 5.70 02/20/2015   HGBA1C 5.60 12/05/2014   HGBA1C 5.9* 09/22/2013     Assessment & Plan   1. Prediabetes  - POCT A1C - Glucose (CBG)  2. Chronic obstructive pulmonary disease, unspecified COPD, unspecified chronic bronchitis type  - tiotropium (SPIRIVA HANDIHALER) 18 MCG inhalation capsule; Place 1 capsule (18 mcg total) into inhaler and inhale daily.  Dispense: 30 capsule; Refill: 3  3. COPD exacerbation (HCC) Patient was given IM methylprednisolone in the clinic today and breathing treatment. Prescribed the following.  - doxycycline (VIBRA-TABS) 100 MG tablet; Take 1 tablet (100 mg total) by mouth 2 (two) times daily.  Dispense: 20 tablet; Refill: 1 - mometasone-formoterol (DULERA) 100-5 MCG/ACT AERO; Inhale 2 puffs into the lungs 2 (two) times daily.  Dispense: 3 Inhaler; Refill: 3 - predniSONE (DELTASONE) 20 MG tablet; Take 4 tabs daily for 5 days, then 3 daily for 5 days, then 2 daily for 5 days, then 1 daily for 5 days and stop  Dispense: 50 tablet; Refill: 0  Onalee HuaDavid was counseled on the dangers of tobacco use, and was advised to quit. Reviewed strategies to maximize success, including removing cigarettes and smoking materials from environment, stress management and support of family/friends.  Patient have been counseled extensively about nutrition and exercise  Return in about 3 months (around 05/21/2015) for Routine Follow Up, COPD.  The patient was given clear instructions to go to ER or return to medical center if symptoms don't improve, worsen or new problems develop. The patient verbalized understanding. The patient was told to call to get lab results if they haven't heard anything in the next week.   This note has been created with Furniture conservator/restorerDragon speech recognition  software and smart phrase technology. Any transcriptional errors are unintentional.    Jeanann LewandowskyJEGEDE, Adrianah Prophete, MD, MHA, FACP, FAAP, CPE [facility] [place], [place] [phone]   02/20/2015, 11:01 AM

## 2015-03-13 MED FILL — ?DULERA 100 MCG/5 MCG INH: 100-5 | 30 days supply | Qty: 2 | Fill #0

## 2015-03-13 MED FILL — IPRAT-ALBUT 0.5-3(2.5) MG/3: 0.5-2.5 (3) | 10 days supply | Qty: 180 | Fill #2

## 2015-03-13 MED FILL — !COMBIVENT RESPIMAT INHAL S: 20-100 MCG | 15 days supply | Qty: 4 | Fill #2

## 2015-03-13 MED FILL — VENTOLIN HFA 90 MCG INHALER: 108 (90 BAS | 25 days supply | Qty: 18 | Fill #1

## 2015-04-04 ENCOUNTER — Encounter: Payer: Self-pay | Admitting: Clinical

## 2015-04-04 NOTE — Progress Notes (Signed)
Depression screen [facility] 2/9 02/20/2015 12/05/2014  Decreased Interest 3 1  Down, Depressed, Hopeless 3 1  PHQ - 2 Score 6 2  Altered sleeping 3 -  Tired, decreased energy 3 -  Change in appetite 1 -  Feeling bad or failure about yourself  3 -  Trouble concentrating 1 -  Moving slowly or fidgety/restless 0 -  Suicidal thoughts 0 -  PHQ-9 Score 17 -    GAD 7 : Generalized Anxiety Score 02/20/2015  Nervous, Anxious, on Edge 2  Control/stop worrying 3  Worry too much - different things 3  Trouble relaxing 3  Restless 1  Easily annoyed or irritable 3  Afraid - awful might happen 3  Total GAD 7 Score 18

## 2015-04-17 MED FILL — IPRAT-ALBUT 0.5-3(2.5) MG/3: 0.5-2.5 (3) | 10 days supply | Qty: 180 | Fill #3

## 2015-04-17 MED FILL — !VENTOLIN HFA INHALER: 108 (90 BAS | 25 days supply | Qty: 18 | Fill #2

## 2015-04-17 MED FILL — DULERA 100 MCG/5 MCG INH: 100-5 | 30 days supply | Qty: 13 | Fill #1

## 2015-05-08 ENCOUNTER — Emergency Department (HOSPITAL_COMMUNITY)
Admission: EM | Admit: 2015-05-08 | Discharge: 2015-05-08 | Disposition: A | Discharge status: Home / Self Care | Payer: Self-pay | Attending: Emergency Medicine | Admitting: Emergency Medicine

## 2015-05-08 ENCOUNTER — Emergency Department (HOSPITAL_COMMUNITY): Payer: Self-pay

## 2015-05-08 ENCOUNTER — Encounter (HOSPITAL_COMMUNITY): Payer: Self-pay | Admitting: Nurse Practitioner

## 2015-05-08 DIAGNOSIS — Z79899 Other long term (current) drug therapy: Secondary | ICD-10-CM | POA: Insufficient documentation

## 2015-05-08 DIAGNOSIS — Z8781 Personal history of (healed) traumatic fracture: Secondary | ICD-10-CM | POA: Insufficient documentation

## 2015-05-08 DIAGNOSIS — G8929 Other chronic pain: Secondary | ICD-10-CM | POA: Insufficient documentation

## 2015-05-08 DIAGNOSIS — Z792 Long term (current) use of antibiotics: Secondary | ICD-10-CM | POA: Insufficient documentation

## 2015-05-08 DIAGNOSIS — J441 Chronic obstructive pulmonary disease with (acute) exacerbation: Secondary | ICD-10-CM | POA: Insufficient documentation

## 2015-05-08 DIAGNOSIS — Z791 Long term (current) use of non-steroidal anti-inflammatories (NSAID): Secondary | ICD-10-CM | POA: Insufficient documentation

## 2015-05-08 DIAGNOSIS — J449 Chronic obstructive pulmonary disease, unspecified: Secondary | ICD-10-CM

## 2015-05-08 DIAGNOSIS — F17210 Nicotine dependence, cigarettes, uncomplicated: Secondary | ICD-10-CM | POA: Insufficient documentation

## 2015-05-08 LAB — BASIC METABOLIC PANEL
Anion gap: 12 (ref 5–15)
BUN: 11 mg/dL (ref 6–20)
CHLORIDE: 104 mmol/L (ref 101–111)
CO2: 25 mmol/L (ref 22–32)
CREATININE: 1.02 mg/dL (ref 0.61–1.24)
Calcium: 9.7 mg/dL (ref 8.9–10.3)
GFR calc non Af Amer: >60 mL/min (ref >60)
GLUCOSE: 111 mg/dL — AB (ref 65–99)
Potassium: 3.3 mmol/L — ABNORMAL LOW (ref 3.5–5.1)
Sodium: 141 mmol/L (ref 135–145)

## 2015-05-08 LAB — CBC
HEMATOCRIT: 41.8 % (ref 39.0–52.0)
HEMOGLOBIN: 13.8 g/dL (ref 13.0–17.0)
MCH: 30.4 pg (ref 26.0–34.0)
MCHC: 33 g/dL (ref 30.0–36.0)
MCV: 92.1 fL (ref 78.0–100.0)
Platelets: 355 10*3/uL (ref 150–400)
RBC: 4.54 MIL/uL (ref 4.22–5.81)
RDW: 13.4 % (ref 11.5–15.5)
WBC: 11.7 10*3/uL — ABNORMAL HIGH (ref 4.0–10.5)

## 2015-05-08 LAB — I-STAT TROPONIN, ED: Troponin i, poc: 0 ng/mL (ref 0.00–0.08)

## 2015-05-08 MED ORDER — METHYLPREDNISOLONE SODIUM SUCC 125 MG IJ SOLR
125.0000 mg | Freq: Once | INTRAMUSCULAR | Status: AC
  Administered 2015-05-08: 125 mg via INTRAVENOUS
  Filled 2015-05-08: qty 2

## 2015-05-08 MED ORDER — PREDNISONE 20 MG PO TABS: 60.0000 mg | ORAL_TABLET | Freq: Every day | ORAL | Status: DC

## 2015-05-08 MED ORDER — AZITHROMYCIN 250 MG PO TABS: 1000.0000 mg | ORAL_TABLET | Freq: Once | ORAL | Status: DC

## 2015-05-08 MED ORDER — MOMETASONE FURO-FORMOTEROL FUM 100-5 MCG/ACT IN AERO: 2.0000 | INHALATION_SPRAY | Freq: Every day | RESPIRATORY_TRACT | Status: DC

## 2015-05-08 MED ORDER — AZITHROMYCIN 250 MG PO TABS
500.0000 mg | ORAL_TABLET | Freq: Once | ORAL | Status: AC
  Administered 2015-05-08: 500 mg via ORAL
  Filled 2015-05-08: qty 2

## 2015-05-08 MED ORDER — ALBUTEROL SULFATE HFA 108 (90 BASE) MCG/ACT IN AERS: 1.0000 | INHALATION_SPRAY | Freq: Four times a day (QID) | RESPIRATORY_TRACT | Status: DC | PRN

## 2015-05-08 MED ORDER — AZITHROMYCIN 250 MG PO TABS: ORAL_TABLET | ORAL | Status: DC

## 2015-05-08 MED ORDER — IPRATROPIUM-ALBUTEROL 0.5-2.5 (3) MG/3ML IN SOLN
3.0000 mL | Freq: Once | RESPIRATORY_TRACT | Status: AC
  Administered 2015-05-08: 3 mL via RESPIRATORY_TRACT
  Filled 2015-05-08: qty 3

## 2015-05-08 NOTE — Discharge Instructions (Signed)
1. Medications: Take antibiotic and steroids as directed until completed, use nebulizer and home inhalers as needed, continue usual home medications 2. Treatment: rest, drink plenty of fluids 3. Follow Up: Please follow up with your primary doctor in 2-3 days for discussion of your diagnoses and further evaluation after today's visit; Please return to the ER for new or worsening symptoms, any additional concerns.

## 2015-05-08 NOTE — ED Provider Notes (Signed)
CSN: [id number]     Arrival date & time 05/08/15  1330 History  By signing my name below, I, Soijett Blue, attest that this documentation has been prepared under the direction and in the presence of Elizabeth Sauer, PA-C Electronically Signed: Soijett Blue, ED Scribe. 05/08/2015. 4:20 PM.    Chief Complaint  Patient presents with  . Shortness of Breath    The history is provided by the patient. No language interpreter was used.    Corey Lawrence is a 60 y.o. male with a medical hx of COPD and bronchitis who presents to the Emergency Department complaining of worsening SOB x6 days. Pt reports that he has a PMHx of COPD x 2 years. Pt notes that he has made contact with his PCP who cannot see him until 05/25/15. Pt states that in the past the pt was given a steroid injection and other medications that relieved his bronchitis. Pt states that he has had to use 8-10 neb treatments/daily for his symptoms. Pt notes that he has used up all of his inhalers and neb treatments due to the severity of his symptoms. Pt states that he has refills for his neb treatments, but not his inhalers. Pt is having associated symptoms of productive cough and CP. He notes that he has tried home neb treatments with no relief of his symptoms. He denies any other symptoms.   Per pt chart review: Pt was seen at [facility] with Dr. Hyman Hopes on 02/20/15 for COPD excerebration. Pt was Rx doxycyline with 1 refill, dulera with 3 refills, and prednisone with no refills for their symptoms. Pt was informed to follow up in office around 05/21/15.    Past Medical History  Diagnosis Date  . COPD (chronic obstructive pulmonary disease) (HCC)   . Tobacco abuse   . Ankle fracture, left     "casted; no OR"  . Chronic lower back pain    Past Surgical History  Procedure Laterality Date  . Incision and drainage of wound Left ~ 1977    "splinter got infected between" digits 2 & 3   Family History  Problem Relation Age of Onset  .  Esophageal cancer Father   . Colon cancer Sister   . Cancer - Other Brother   . Heart disease Mother   . Diabetes Mellitus II Mother   . Heart disease      multiple maternal family members  . Diabetes Mellitus II      multiple maternal family members   Social History  Substance Use Topics  . Smoking status: Current Every Day Smoker -- 0.25 packs/day for 43 years    Types: Cigarettes  . Smokeless tobacco: Never Used     Comment: currently on patch  . 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   Cardiovascular: Positive for chest pain.  All other systems reviewed and are negative.     Allergies  Review of patient's allergies indicates no known allergies.  Home Medications   Prior to Admission medications   Medication Sig Start Date End Date Taking? Authorizing Provider  acetaminophen (TYLENOL) 325 MG tablet Take 2 tablets (650 mg total) by mouth every 6 (six) hours as needed for mild pain (or Fever >/= 101). 09/23/13   Christiane Ha, MD  albuterol (PROVENTIL HFA;VENTOLIN HFA) 108 (90 Base) MCG/ACT inhaler Inhale 1-2 puffs into the lungs every 6 (  six) hours as needed for wheezing or shortness of breath. 05/08/15   Chase Picket Ward, PA-C  azithromycin (ZITHROMAX) 250 MG tablet Take 1 tablet by mouth daily for the next 4 days. 05/08/15   Chase Picket Ward, PA-C  doxycycline (VIBRA-TABS) 100 MG tablet Take 1 tablet (100 mg total) by mouth 2 (two) times daily. 02/20/15   Quentin Angst, MD  mometasone-formoterol (DULERA) 100-5 MCG/ACT AERO Inhale 2 puffs into the lungs daily. 05/08/15   Chase Picket Ward, PA-C  Multiple Vitamins-Minerals (MULTIVITAMIN ADULTS 50+) TABS Take 1 tablet by mouth daily.    Historical Provider, MD  naproxen (NAPROSYN) 500 MG tablet Take 1 tablet (500 mg total) by mouth 2 (two) times daily with a meal. 12/05/14   Olugbemiga E Hyman Hopes, MD  nicotine  (NICODERM CQ - DOSED IN MG/24 HOURS) 21 mg/24hr patch Place 1 patch (21 mg total) onto the skin daily. 12/05/14   Quentin Angst, MD  predniSONE (DELTASONE) 20 MG tablet Take 3 tablets (60 mg total) by mouth daily. 05/08/15   Chase Picket Ward, PA-C  tiotropium (SPIRIVA HANDIHALER) 18 MCG inhalation capsule Place 1 capsule (18 mcg total) into inhaler and inhale daily. 02/20/15   Quentin Angst, MD  traMADol (ULTRAM) 50 MG tablet Take 1 tablet (50 mg total) by mouth every 8 (eight) hours as needed for moderate pain. 05/09/14   Doris Cheadle, MD   BP 102/67 mmHg  Pulse 83  Temp(Src) 97.8 F (36.6 C) (Oral)  Resp 18  SpO2 97% Physical Exam  Constitutional: He is oriented to person, place, and time. He appears well-developed and well-nourished. No distress.  HENT:  Head: Normocephalic and atraumatic.  Mouth/Throat: Oropharynx is clear and moist. No oropharyngeal exudate.  Neck: Normal range of motion. Neck supple.  Cardiovascular: Normal rate, regular rhythm and normal heart sounds.  Exam reveals no gallop and no friction rub.   No murmur heard. Pulmonary/Chest: Effort normal. No respiratory distress.  Inspiratory and expiratory wheezes throughout all lung fields bilaterally.  Abdominal: Soft. He exhibits no distension. There is no tenderness.  Musculoskeletal: Normal range of motion.  Neurological: He is alert and oriented to person, place, and time.  Skin: Skin is warm and dry.  Psychiatric: He has a normal mood and affect. His behavior is normal.  Nursing note and vitals reviewed.   ED Course  Procedures (including critical care time) DIAGNOSTIC STUDIES: Oxygen Saturation is 96% on RA, nl by my interpretation.    COORDINATION OF CARE: 4:18 PM Discussed treatment plan with pt at bedside which includes labs, CXR, breathing treatment, and pt agreed to plan.   Labs Review Labs Reviewed  BASIC METABOLIC PANEL - Abnormal; Notable for the following:    Potassium 3.3 (*)     Glucose, Bld 111 (*)    All other components within normal limits  CBC - Abnormal; Notable for the following:    WBC 11.7 (*)    All other components within normal limits  I-STAT TROPOININ, ED    Imaging Review Dg Chest 2 View  05/08/2015  CLINICAL DATA:  Three day history worsening shortness of breath, cough, chest pain EXAM: CHEST  2 VIEW COMPARISON:  12/06/2013 FINDINGS: There is hyperinflation of the lungs compatible with COPD. Heart and mediastinal contours are within normal limits. No focal opacities or effusions. No acute bony abnormality. IMPRESSION: COPD.  No active disease. Electronically Signed   By: Charlett Nose M.D.   On: 05/08/2015 14:56   I have personally reviewed  and evaluated these images and lab results as part of my medical decision-making.   EKG Interpretation None      MDM   Final diagnoses:  Chronic obstructive pulmonary disease, unspecified COPD type (HCC)   Corey Lawrence presents for worsening shortness of breath and productive cough x 6 days. On exam, patient is afebrile and 96% O2 on RA. CBC, BMP, and troponin reviewed and reassuring. Chest x-ray with changes consistent with COPD, but no acute cardiopulmonary disease. Will treat with DuoNeb, Solu-Medrol, and give first dose of antibiotics while in ED, then reevaluate  5:10 PM- Pt re-evaluated and feels much improved. Lung sounds improved but still wheezing. Will discharge to home with prescription for the rest of his azithromycin antibiotic treatment, as well as steroid burst and refills of home inhalers. PCP follow up strongly encouraged and patient agrees to follow up. Home care instructions and return precautions given. All questions answered.   I personally performed the services described in this documentation, which was scribed in my presence. The recorded information has been reviewed and is accurate.   [place] HarborJaime Pilcher Ward, PA-C 05/08/15 1736  Richardean Canalavid H Yao, MD 05/11/15 [phone]

## 2015-05-08 NOTE — ED Notes (Signed)
Pt c/o 3 day history worsening SOB, cough, CP and sweats. He has hx COPD and bronchitis with chronic respiratory symptoms but is feeling worse than normal. He has been using his nebs at home with no relief. He is alert adn able to speak in full unlabored sentences

## 2015-05-12 MED FILL — predniSONE 20 MG TABS: 20 | 4 days supply | Qty: 12 | Fill #0

## 2015-05-12 MED FILL — ?AZITHROMYCIN 250 MG TABLET: 250 MG | 4 days supply | Qty: 4 | Fill #0

## 2015-05-12 MED FILL — !VENTOLIN HFA INHALER: 108 (90 BAS | 25 days supply | Qty: 18 | Fill #3

## 2015-05-15 MED FILL — IPRAT-ALBUT 0.5-3(2.5) MG/3: 0.5-2.5 (3) | 10 days supply | Qty: 180 | Fill #4

## 2015-05-15 MED FILL — !DULERA 100 MCG/5 MCG INH: 100-5 | 30 days supply | Qty: 13 | Fill #0

## 2015-05-25 ENCOUNTER — Encounter: Payer: Self-pay | Admitting: Internal Medicine

## 2015-05-25 ENCOUNTER — Ambulatory Visit: Payer: Self-pay | Attending: Internal Medicine | Admitting: Internal Medicine

## 2015-05-25 VITALS — BP 124/72 | HR 72 | Temp 97.6°F | Resp 16 | Ht 64.0 in | Wt 136.0 lb

## 2015-05-25 DIAGNOSIS — M545 Low back pain: Secondary | ICD-10-CM | POA: Insufficient documentation

## 2015-05-25 DIAGNOSIS — M25512 Pain in left shoulder: Secondary | ICD-10-CM

## 2015-05-25 DIAGNOSIS — F17200 Nicotine dependence, unspecified, uncomplicated: Secondary | ICD-10-CM

## 2015-05-25 DIAGNOSIS — F17210 Nicotine dependence, cigarettes, uncomplicated: Secondary | ICD-10-CM | POA: Insufficient documentation

## 2015-05-25 DIAGNOSIS — Z79899 Other long term (current) drug therapy: Secondary | ICD-10-CM | POA: Insufficient documentation

## 2015-05-25 DIAGNOSIS — J441 Chronic obstructive pulmonary disease with (acute) exacerbation: Secondary | ICD-10-CM

## 2015-05-25 MED ORDER — TIOTROPIUM BROMIDE MONOHYDRATE 18 MCG IN CAPS: 18.0000 ug | ORAL_CAPSULE | Freq: Every day | RESPIRATORY_TRACT | Status: DC

## 2015-05-25 MED ORDER — TRAMADOL HCL 50 MG PO TABS: 50.0000 mg | ORAL_TABLET | Freq: Three times a day (TID) | ORAL | Status: DC | PRN

## 2015-05-25 MED ORDER — DOXYCYCLINE HYCLATE 100 MG PO TABS: 100.0000 mg | ORAL_TABLET | Freq: Two times a day (BID) | ORAL | Status: AC

## 2015-05-25 MED ORDER — MOMETASONE FURO-FORMOTEROL FUM 100-5 MCG/ACT IN AERO: 2.0000 | INHALATION_SPRAY | Freq: Every day | RESPIRATORY_TRACT | Status: DC

## 2015-05-25 MED ORDER — ALBUTEROL SULFATE HFA 108 (90 BASE) MCG/ACT IN AERS: 1.0000 | INHALATION_SPRAY | Freq: Four times a day (QID) | RESPIRATORY_TRACT | Status: DC | PRN

## 2015-05-25 MED ORDER — PREDNISONE 20 MG PO TABS: 60.0000 mg | ORAL_TABLET | Freq: Every day | ORAL | Status: DC

## 2015-05-25 NOTE — Patient Instructions (Signed)
Smoking Cessation, Tips for Success If you are ready to quit smoking, congratulations! You have chosen to help yourself be healthier. Cigarettes bring nicotine, tar, carbon monoxide, and other irritants into your body. Your lungs, heart, and blood vessels will be able to work better without these poisons. There are many different ways to quit smoking. Nicotine gum, nicotine patches, a nicotine inhaler, or nicotine nasal spray can help with physical craving. Hypnosis, support groups, and medicines help break the habit of smoking. WHAT THINGS CAN I DO TO MAKE QUITTING EASIER?  Here are some tips to help you quit for good:  Pick a date when you will quit smoking completely. Tell all of your friends and family about your plan to quit on that date.  Do not try to slowly cut down on the number of cigarettes you are smoking. Pick a quit date and quit smoking completely starting on that day.  Throw away all cigarettes.   Clean and remove all ashtrays from your home, work, and car.  On a card, write down your reasons for quitting. Carry the card with you and read it when you get the urge to smoke.  Cleanse your body of nicotine. Drink enough water and fluids to keep your urine clear or pale yellow. Do this after quitting to flush the nicotine from your body.  Learn to predict your moods. Do not let a bad situation be your excuse to have a cigarette. Some situations in your life might tempt you into wanting a cigarette.  Never have "just one" cigarette. It leads to wanting another and another. Remind yourself of your decision to quit.  Change habits associated with smoking. If you smoked while driving or when feeling stressed, try other activities to replace smoking. Stand up when drinking your coffee. Brush your teeth after eating. Sit in a different chair when you read the paper. Avoid alcohol while trying to quit, and try to drink fewer caffeinated beverages. Alcohol and caffeine may urge you to  smoke.  Avoid foods and drinks that can trigger a desire to smoke, such as sugary or spicy foods and alcohol.  Ask people who smoke not to smoke around you.  Have something planned to do right after eating or having a cup of coffee. For example, plan to take a walk or exercise.  Try a relaxation exercise to calm you down and decrease your stress. Remember, you may be tense and nervous for the first 2 weeks after you quit, but this will pass.  Find new activities to keep your hands busy. Play with a pen, coin, or rubber band. Doodle or draw things on paper.  Brush your teeth right after eating. This will help cut down on the craving for the taste of tobacco after meals. You can also try mouthwash.   Use oral substitutes in place of cigarettes. Try using lemon drops, carrots, cinnamon sticks, or chewing gum. Keep them handy so they are available when you have the urge to smoke.  When you have the urge to smoke, try deep breathing.  Designate your home as a nonsmoking area.  If you are a heavy smoker, ask your health care provider about a prescription for nicotine chewing gum. It can ease your withdrawal from nicotine.  Reward yourself. Set aside the cigarette money you save and buy yourself something nice.  Look for support from others. Join a support group or smoking cessation program. Ask someone at home or at work to help you with your plan   to quit smoking.  Always ask yourself, "Do I need this cigarette or is this just a reflex?" Tell yourself, "Today, I choose not to smoke," or "I do not want to smoke." You are reminding yourself of your decision to quit.  Do not replace cigarette smoking with electronic cigarettes (commonly called e-cigarettes). The safety of e-cigarettes is unknown, and some may contain harmful chemicals.  If you relapse, do not give up! Plan ahead and think about what you will do the next time you get the urge to smoke. HOW WILL I FEEL WHEN I QUIT SMOKING? You  may have symptoms of withdrawal because your body is used to nicotine (the addictive substance in cigarettes). You may crave cigarettes, be irritable, feel very hungry, cough often, get headaches, or have difficulty concentrating. The withdrawal symptoms are only temporary. They are strongest when you first quit but will go away within 10-14 days. When withdrawal symptoms occur, stay in control. Think about your reasons for quitting. Remind yourself that these are signs that your body is healing and getting used to being without cigarettes. Remember that withdrawal symptoms are easier to treat than the major diseases that smoking can cause.  Even after the withdrawal is over, expect periodic urges to smoke. However, these cravings are generally short lived and will go away whether you smoke or not. Do not smoke! WHAT RESOURCES ARE AVAILABLE TO HELP ME QUIT SMOKING? Your health care provider can direct you to community resources or hospitals for support, which may include:  Group support.  Education.  Hypnosis.  Therapy.   This information is not intended to replace advice given to you by your health care provider. Make sure you discuss any questions you have with your health care provider.   Document Released: 10/20/2003 Document Revised: 02/11/2014 Document Reviewed: 07/09/2012 Elsevier Interactive Patient Education 2016 Elsevier Inc[employer] Chronic Obstructive Pulmonary Disease Chronic obstructive pulmonary disease (COPD) is a common lung condition in which airflow from the lungs is limited. COPD is a general term that can be used to describe many different lung problems that limit airflow, including both chronic bronchitis and emphysema. If you have COPD, your lung function will probably never return to normal, but there are measures you can take to improve lung function and make yourself feel better. CAUSES   Smoking (common).  Exposure to secondhand smoke.  Genetic problems.  Chronic  inflammatory lung diseases or recurrent infections. SYMPTOMS  Shortness of breath, especially with physical activity.  Deep, persistent (chronic) cough with a large amount of thick mucus.  Wheezing.  Rapid breaths (tachypnea).  Gray or bluish discoloration (cyanosis) of the skin, especially in your fingers, toes, or lips.  Fatigue.  Weight loss.  Frequent infections or episodes when breathing symptoms become much worse (exacerbations).  Chest tightness. DIAGNOSIS Your health care provider will take a medical history and perform a physical examination to diagnose COPD. Additional tests for COPD may include:  Lung (pulmonary) function tests.  Chest X-ray.  CT scan.  Blood tests. TREATMENT  Treatment for COPD may include:  Inhaler and nebulizer medicines. These help manage the symptoms of COPD and make your breathing more comfortable.  Supplemental oxygen. Supplemental oxygen is only helpful if you have a low oxygen level in your blood.  Exercise and physical activity. These are beneficial for nearly all people with COPD.  Lung surgery or transplant.  Nutrition therapy to gain weight, if you are underweight.  Pulmonary rehabilitation. This may involve working with a team of health   care providers and specialists, such as respiratory, occupational, and physical therapists. HOME CARE INSTRUCTIONS  Take all medicines (inhaled or pills) as directed by your health care provider.  Avoid over-the-counter medicines or cough syrups that dry up your airway (such as antihistamines) and slow down the elimination of secretions unless instructed otherwise by your health care provider.  If you are a smoker, the most important thing that you can do is stop smoking. Continuing to smoke will cause further lung damage and breathing trouble. Ask your health care provider for help with quitting smoking. He or she can direct you to community resources or hospitals that provide  support.  Avoid exposure to irritants such as smoke, chemicals, and fumes that aggravate your breathing.  Use oxygen therapy and pulmonary rehabilitation if directed by your health care provider. If you require home oxygen therapy, ask your health care provider whether you should purchase a pulse oximeter to measure your oxygen level at home.  Avoid contact with individuals who have a contagious illness.  Avoid extreme temperature and humidity changes.  Eat healthy foods. Eating smaller, more frequent meals and resting before meals may help you maintain your strength.  Stay active, but balance activity with periods of rest. Exercise and physical activity will help you maintain your ability to do things you want to do.  Preventing infection and hospitalization is very important when you have COPD. Make sure to receive all the vaccines your health care provider recommends, especially the pneumococcal and influenza vaccines. Ask your health care provider whether you need a pneumonia vaccine.  Learn and use relaxation techniques to manage stress.  Learn and use controlled breathing techniques as directed by your health care provider. Controlled breathing techniques include:  Pursed lip breathing. Start by breathing in (inhaling) through your nose for 1 second. Then, purse your lips as if you were going to whistle and breathe out (exhale) through the pursed lips for 2 seconds.  Diaphragmatic breathing. Start by putting one hand on your abdomen just above your waist. Inhale slowly through your nose. The hand on your abdomen should move out. Then purse your lips and exhale slowly. You should be able to feel the hand on your abdomen moving in as you exhale.  Learn and use controlled coughing to clear mucus from your lungs. Controlled coughing is a series of short, progressive coughs. The steps of controlled coughing are: 1. Lean your head slightly forward. 2. Breathe in deeply using diaphragmatic  breathing. 3. Try to hold your breath for 3 seconds. 4. Keep your mouth slightly open while coughing twice. 5. Spit any mucus out into a tissue. 6. Rest and repeat the steps once or twice as needed. SEEK MEDICAL CARE IF:  You are coughing up more mucus than usual.  There is a change in the color or thickness of your mucus.  Your breathing is more labored than usual.  Your breathing is faster than usual. SEEK IMMEDIATE MEDICAL CARE IF:  You have shortness of breath while you are resting.  You have shortness of breath that prevents you from:  Being able to talk.  Performing your usual physical activities.  You have chest pain lasting longer than 5 minutes.  Your skin color is more cyanotic than usual.  You measure low oxygen saturations for longer than 5 minutes with a pulse oximeter. MAKE SURE YOU:  Understand these instructions.  Will watch your condition.  Will get help right away if you are not doing well or get worse.     This information is not intended to replace advice given to you by your health care provider. Make sure you discuss any questions you have with your health care provider.   Document Released: 10/31/2004 Document Revised: 02/11/2014 Document Reviewed: 09/17/2012 Elsevier Interactive Patient Education 2016 Elsevier Inc.  

## 2015-05-25 NOTE — Progress Notes (Signed)
Patient ID: Corey Lawrence, male   DOB: [birth year], 60 y.o.   MRN: [id number]   Corey Lawrence, is a 60 y.o. male  WUJ:[id number]  NFA:[id number]  DOB - 1955/10/17  Chief Complaint  Patient presents with  . Follow-up  . COPD        Subjective:   Corey Lawrence is a 60 y.o. male with history of COPD, ongoing tobacco abuse and chronic low back pain here today for a follow up ED visit. Patient was recently seen in the ED for COPD exacerbation, he was appropriately managed and instructed to follow-up with [place] today. He has no new complaints today. He needs refill on his medications. He claims he is doing much better, no significant shortness of breath, occasional cough attributable to ongoing smoking. No chest pain. Patient has No headache, No abdominal pain - No Nausea, No new weakness tingling or numbness. He has chronic low back pain and left sided shoulder pain due to osteoarthritis. Patient is requesting something for pain, he had been on tramadol in the past. Patient continues to smoke heavily, but he said he is down to about 1 pack in 3 days as against one and half pack per day previously. He started smoking as a teenager.  Problem  Left Shoulder Pain    ALLERGIES: No Known Allergies  PAST MEDICAL HISTORY: Past Medical History  Diagnosis Date  . COPD (chronic obstructive pulmonary disease) (HCC)   . Tobacco abuse   . Ankle fracture, left     "casted; no OR"  . Chronic lower back pain     MEDICATIONS AT HOME: Prior to Admission medications   Medication Sig Start Date End Date Taking? Authorizing Provider  acetaminophen (TYLENOL) 325 MG tablet Take 2 tablets (650 mg total) by mouth every 6 (six) hours as needed for mild pain (or Fever >/= 101). 09/23/13  Yes Christiane Ha, MD  albuterol (PROVENTIL HFA;VENTOLIN HFA) 108 (90 Base) MCG/ACT inhaler Inhale 1-2 puffs into the lungs every 6 (six) hours as needed for wheezing or shortness of breath. 05/25/15  Yes Quentin Angst, MD   azithromycin (ZITHROMAX) 250 MG tablet Take 1 tablet by mouth daily for the next 4 days. 05/08/15  Yes Jaime Pilcher Ward, PA-C  mometasone-formoterol (DULERA) 100-5 MCG/ACT AERO Inhale 2 puffs into the lungs daily. 05/25/15  Yes Quentin Angst, MD  Multiple Vitamins-Minerals (MULTIVITAMIN ADULTS 50+) TABS Take 1 tablet by mouth daily.   Yes Historical Provider, MD  naproxen (NAPROSYN) 500 MG tablet Take 1 tablet (500 mg total) by mouth 2 (two) times daily with a meal. 12/05/14  Yes Roiza Wiedel E Hyman Hopes, MD  nicotine (NICODERM CQ - DOSED IN MG/24 HOURS) 21 mg/24hr patch Place 1 patch (21 mg total) onto the skin daily. 12/05/14  Yes Quentin Angst, MD  predniSONE (DELTASONE) 20 MG tablet Take 3 tablets (60 mg total) by mouth daily. 05/25/15  Yes Quentin Angst, MD  tiotropium (SPIRIVA HANDIHALER) 18 MCG inhalation capsule Place 1 capsule (18 mcg total) into inhaler and inhale daily. 05/25/15  Yes Clydene Burack Annitta Needs, MD  traMADol (ULTRAM) 50 MG tablet Take 1 tablet (50 mg total) by mouth every 8 (eight) hours as needed for moderate pain. 05/25/15  Yes Quentin Angst, MD  doxycycline (VIBRA-TABS) 100 MG tablet Take 1 tablet (100 mg total) by mouth 2 (two) times daily. 05/25/15 06/08/15  Quentin Angst, MD     Objective:   Filed Vitals:   05/25/15 1747  BP: 124/72  Pulse: 72  Temp: 97.6 F (36.4 C)  TempSrc: Oral  Resp: 16  Height: 5\' 4"  (1.626 m)  Weight: 136 lb (61.689 kg)  SpO2: 99%    Exam General appearance : Awake, alert, not in any distress. Speech Clear. Not toxic looking HEENT: Atraumatic and Normocephalic, pupils equally reactive to light and accomodation Neck: supple, no JVD. No cervical lymphadenopathy.  Chest: Scattered Wheezes, fair air entry bilaterally, no added sounds  CVS: S1 S2 regular, no murmurs.  Abdomen: Bowel sounds present, Non tender and not distended with no gaurding, rigidity or rebound. Extremities: B/L Lower Ext shows no edema, both legs  are warm to touch Neurology: Awake alert, and oriented X 3, CN II-XII intact, Non focal  Data Review Lab Results  Component Value Date   HGBA1C 5.70 02/20/2015   HGBA1C 5.60 12/05/2014   HGBA1C 5.9* 09/22/2013     Assessment & Plan   1. COPD exacerbation (HCC)  - albuterol (PROVENTIL HFA;VENTOLIN HFA) 108 (90 Base) MCG/ACT inhaler; Inhale 1-2 puffs into the lungs every 6 (six) hours as needed for wheezing or shortness of breath.  Dispense: 1 Inhaler; Refill: 1 - mometasone-formoterol (DULERA) 100-5 MCG/ACT AERO; Inhale 2 puffs into the lungs daily.  Dispense: 1 Inhaler; Refill: 1 - doxycycline (VIBRA-TABS) 100 MG tablet; Take 1 tablet (100 mg total) by mouth 2 (two) times daily.  Dispense: 20 tablet; Refill: 1 - predniSONE (DELTASONE) 20 MG tablet; Take 3 tablets (60 mg total) by mouth daily.  Dispense: 10 tablet; Refill: 0 - tiotropium (SPIRIVA HANDIHALER) 18 MCG inhalation capsule; Place 1 capsule (18 mcg total) into inhaler and inhale daily.  Dispense: 90 capsule; Refill: 3  2. Left shoulder pain  - traMADol (ULTRAM) 50 MG tablet; Take 1 tablet (50 mg total) by mouth every 8 (eight) hours as needed for moderate pain.  Dispense: 60 tablet; Refill: 0  3. Smoking addiction  Corey Lawrence was counseled on the dangers of tobacco use, and was advised to quit. Reviewed strategies to maximize success, including removing cigarettes and smoking materials from environment, stress management and support of family/friends.  Patient have been counseled extensively about nutrition and exercise  Return in about 3 months (around 08/24/2015) for COPD, Follow up Pain and comorbidities.  The patient was given clear instructions to go to ER or return to medical center if symptoms don't improve, worsen or new problems develop. The patient verbalized understanding. The patient was told to call to get lab results if they haven't heard anything in the next week.   This note has been created with Engineer, agriculturalDragon speech  recognition software and smart phrase technology. Any transcriptional errors are unintentional.    Corey Lawrence, Corey Ringel, MD, MHA, FACP, FAAP, CPE [facility] [place], [place] [phone]   05/25/2015, 6:07 PM

## 2015-05-25 NOTE — Progress Notes (Signed)
Patient's here for f/up COPD.  Patient c/o old injury to left side ribs that's pain to the touch. Patient states that it's painful with deep breath, and cough.  Patient c/o vomiting several times since taking tylenol.  Patient requesting med refill.

## 2015-05-26 MED FILL — SPIRIVA 18 MCG CP-HANDIHALE: 18 | 30 days supply | Qty: 30 | Fill #0

## 2015-05-26 MED FILL — !VENTOLIN HFA INHALER: 108 (90 BAS | 28 days supply | Qty: 18 | Fill #0

## 2015-05-26 MED FILL — **DULERA 100 MCG/5 MCG INHA: 100-5 MCG | 30 days supply | Qty: 1 | Fill #0

## 2015-05-26 MED FILL — DOXYCYCLINE 100 MG TABLET: 100 | 10 days supply | Qty: 20 | Fill #0

## 2015-05-26 MED FILL — predniSONE 20 MG TABS: 20 | 7 days supply | Qty: 10 | Fill #0

## 2015-06-09 MED FILL — traMADol HCL 50 MG TABS: 50 | 20 days supply | Qty: 60 | Fill #0

## 2015-06-22 ENCOUNTER — Telehealth: Payer: Self-pay | Admitting: Internal Medicine

## 2015-06-22 MED FILL — **DULERA 100 MCG/5 MCG INHA: 100-5 MCG | 30 days supply | Qty: 1 | Fill #1

## 2015-06-22 MED FILL — VENTOLIN HFA 90 MCG INHALER: 108 (90 BAS | 28 days supply | Qty: 18 | Fill #1

## 2015-06-22 MED FILL — IPRAT-ALBUT 0.5-3(2.5) MG/3: 0.5-2.5 (3) | 10 days supply | Qty: 180 | Fill #5

## 2015-06-22 NOTE — Telephone Encounter (Signed)
Patients daughter dropped off form to be filled by the doctor. Please follow up.

## 2015-07-12 NOTE — Telephone Encounter (Signed)
MA has form and is awaiting providers portion. Patient will be contacted upon completion.

## 2015-07-12 NOTE — Telephone Encounter (Signed)
Medical Assistant left message on patient's home and cell voicemail. Voicemail states to give a call back to [place] [facility] at [phone].  !!!Please inform patient of paperwork being faxed and placed at the front desk for pick up!!!

## 2015-07-13 ENCOUNTER — Telehealth: Payer: Self-pay | Admitting: Internal Medicine

## 2015-07-13 DIAGNOSIS — J441 Chronic obstructive pulmonary disease with (acute) exacerbation: Secondary | ICD-10-CM

## 2015-07-13 MED ORDER — ALBUTEROL SULFATE HFA 108 (90 BASE) MCG/ACT IN AERS
1.0000 | INHALATION_SPRAY | Freq: Four times a day (QID) | RESPIRATORY_TRACT | Status: DC | PRN
Start: 2015-07-13 — End: 2015-07-28

## 2015-07-13 MED ORDER — MOMETASONE FURO-FORMOTEROL FUM 100-5 MCG/ACT IN AERO: 2.0000 | INHALATION_SPRAY | Freq: Every day | RESPIRATORY_TRACT | Status: DC

## 2015-07-13 NOTE — Telephone Encounter (Signed)
Patient needs tramadol, dulera, albuterol, prednisone, anti biotic for inflammation of the lungs Please follow up

## 2015-07-13 NOTE — Telephone Encounter (Signed)
[facility] Will forward the request to Dr. Hyman HopesJegede for tramadol, prednisone, and an antibiotic.

## 2015-07-14 MED FILL — VENTOLIN HFA 90 MCG INHALER: 108 (90 BAS | 20 days supply | Qty: 18 | Fill #0

## 2015-07-14 NOTE — Telephone Encounter (Signed)
Attempted to contact patient to let him know that he needs an office visit for assess for need of antibiotics or prednisone. Unable to reach him or leave a message.

## 2015-07-14 NOTE — Telephone Encounter (Signed)
Patient needs an office visit to determine need for antibiotics and prednisone. Thanks

## 2015-07-20 ENCOUNTER — Encounter: Payer: Self-pay | Admitting: Internal Medicine

## 2015-07-20 ENCOUNTER — Ambulatory Visit: Payer: Self-pay | Attending: Internal Medicine | Admitting: Internal Medicine

## 2015-07-20 VITALS — BP 112/66 | HR 72 | Temp 98.7°F | Resp 18 | Ht 64.0 in | Wt 136.6 lb

## 2015-07-20 DIAGNOSIS — F17200 Nicotine dependence, unspecified, uncomplicated: Secondary | ICD-10-CM | POA: Insufficient documentation

## 2015-07-20 DIAGNOSIS — M545 Low back pain: Secondary | ICD-10-CM | POA: Insufficient documentation

## 2015-07-20 DIAGNOSIS — Z791 Long term (current) use of non-steroidal anti-inflammatories (NSAID): Secondary | ICD-10-CM | POA: Insufficient documentation

## 2015-07-20 DIAGNOSIS — J441 Chronic obstructive pulmonary disease with (acute) exacerbation: Secondary | ICD-10-CM | POA: Insufficient documentation

## 2015-07-20 DIAGNOSIS — Z79899 Other long term (current) drug therapy: Secondary | ICD-10-CM | POA: Insufficient documentation

## 2015-07-20 MED ORDER — PREDNISONE 10 MG PO TABS: ORAL_TABLET | ORAL | Status: DC

## 2015-07-20 MED ORDER — TIOTROPIUM BROMIDE MONOHYDRATE 18 MCG IN CAPS: 18.0000 ug | ORAL_CAPSULE | Freq: Every day | RESPIRATORY_TRACT | Status: DC

## 2015-07-20 MED ORDER — MOMETASONE FURO-FORMOTEROL FUM 100-5 MCG/ACT IN AERO: 2.0000 | INHALATION_SPRAY | Freq: Every day | RESPIRATORY_TRACT | Status: DC

## 2015-07-20 MED ORDER — METHYLPREDNISOLONE SODIUM SUCC 125 MG IJ SOLR: 125.0000 mg | Freq: Once | INTRAMUSCULAR | Status: DC

## 2015-07-20 MED ORDER — ALBUTEROL SULFATE (2.5 MG/3ML) 0.083% IN NEBU: 2.5000 mg | INHALATION_SOLUTION | Freq: Four times a day (QID) | RESPIRATORY_TRACT | Status: DC | PRN

## 2015-07-20 MED ORDER — METHYLPREDNISOLONE SODIUM SUCC 125 MG IJ SOLR
125.0000 mg | Freq: Once | INTRAMUSCULAR | Status: AC
  Administered 2015-07-20: 125 mg via INTRAMUSCULAR

## 2015-07-20 MED ORDER — DOXYCYCLINE HYCLATE 100 MG PO TABS: 100.0000 mg | ORAL_TABLET | Freq: Two times a day (BID) | ORAL | Status: DC

## 2015-07-20 MED FILL — DOXYCYCLINE 100 MG TABLET: 100 | 10 days supply | Qty: 20 | Fill #0

## 2015-07-20 MED FILL — ALBUTEROL 0.083% INHAL SOLN: (2.5 MG/3ML | 15 days supply | Qty: 180 | Fill #0

## 2015-07-20 MED FILL — **DULERA 100 MCG/5 MCG INH: 100-5 | 30 days supply | Qty: 13 | Fill #0

## 2015-07-20 MED FILL — predniSONE 10 MG TABS: 10 | 20 days supply | Qty: 65 | Fill #0

## 2015-07-20 MED FILL — IPRAT-ALBUT 0.5-3(2.5) MG/3: 0.5-2.5 (3) | 10 days supply | Qty: 180 | Fill #6

## 2015-07-20 NOTE — Progress Notes (Signed)
Patient is here for FU COPD  Patient complains of SOB and wheezing being present. Patient complains of left shoulder pain being present. Scaled currently at an 8. Pain is described as aching.  Patient has taken medication today and patient has eaten today.  Patient tolerated injection well in the right dorsogluteal.

## 2015-07-20 NOTE — Patient Instructions (Signed)
Smoking Cessation, Tips for Success If you are ready to quit smoking, congratulations! You have chosen to help yourself be healthier. Cigarettes bring nicotine, tar, carbon monoxide, and other irritants into your body. Your lungs, heart, and blood vessels will be able to work better without these poisons. There are many different ways to quit smoking. Nicotine gum, nicotine patches, a nicotine inhaler, or nicotine nasal spray can help with physical craving. Hypnosis, support groups, and medicines help break the habit of smoking. WHAT THINGS CAN I DO TO MAKE QUITTING EASIER?  Here are some tips to help you quit for good:  Pick a date when you will quit smoking completely. Tell all of your friends and family about your plan to quit on that date.  Do not try to slowly cut down on the number of cigarettes you are smoking. Pick a quit date and quit smoking completely starting on that day.  Throw away all cigarettes.   Clean and remove all ashtrays from your home, work, and car.  On a card, write down your reasons for quitting. Carry the card with you and read it when you get the urge to smoke.  Cleanse your body of nicotine. Drink enough water and fluids to keep your urine clear or pale yellow. Do this after quitting to flush the nicotine from your body.  Learn to predict your moods. Do not let a bad situation be your excuse to have a cigarette. Some situations in your life might tempt you into wanting a cigarette.  Never have "just one" cigarette. It leads to wanting another and another. Remind yourself of your decision to quit.  Change habits associated with smoking. If you smoked while driving or when feeling stressed, try other activities to replace smoking. Stand up when drinking your coffee. Brush your teeth after eating. Sit in a different chair when you read the paper. Avoid alcohol while trying to quit, and try to drink fewer caffeinated beverages. Alcohol and caffeine may urge you to  smoke.  Avoid foods and drinks that can trigger a desire to smoke, such as sugary or spicy foods and alcohol.  Ask people who smoke not to smoke around you.  Have something planned to do right after eating or having a cup of coffee. For example, plan to take a walk or exercise.  Try a relaxation exercise to calm you down and decrease your stress. Remember, you may be tense and nervous for the first 2 weeks after you quit, but this will pass.  Find new activities to keep your hands busy. Play with a pen, coin, or rubber band. Doodle or draw things on paper.  Brush your teeth right after eating. This will help cut down on the craving for the taste of tobacco after meals. You can also try mouthwash.   Use oral substitutes in place of cigarettes. Try using lemon drops, carrots, cinnamon sticks, or chewing gum. Keep them handy so they are available when you have the urge to smoke.  When you have the urge to smoke, try deep breathing.  Designate your home as a nonsmoking area.  If you are a heavy smoker, ask your health care provider about a prescription for nicotine chewing gum. It can ease your withdrawal from nicotine.  Reward yourself. Set aside the cigarette money you save and buy yourself something nice.  Look for support from others. Join a support group or smoking cessation program. Ask someone at home or at work to help you with your plan   to quit smoking.  Always ask yourself, "Do I need this cigarette or is this just a reflex?" Tell yourself, "Today, I choose not to smoke," or "I do not want to smoke." You are reminding yourself of your decision to quit.  Do not replace cigarette smoking with electronic cigarettes (commonly called e-cigarettes). The safety of e-cigarettes is unknown, and some may contain harmful chemicals.  If you relapse, do not give up! Plan ahead and think about what you will do the next time you get the urge to smoke. HOW WILL I FEEL WHEN I QUIT SMOKING? You  may have symptoms of withdrawal because your body is used to nicotine (the addictive substance in cigarettes). You may crave cigarettes, be irritable, feel very hungry, cough often, get headaches, or have difficulty concentrating. The withdrawal symptoms are only temporary. They are strongest when you first quit but will go away within 10-14 days. When withdrawal symptoms occur, stay in control. Think about your reasons for quitting. Remind yourself that these are signs that your body is healing and getting used to being without cigarettes. Remember that withdrawal symptoms are easier to treat than the major diseases that smoking can cause.  Even after the withdrawal is over, expect periodic urges to smoke. However, these cravings are generally short lived and will go away whether you smoke or not. Do not smoke! WHAT RESOURCES ARE AVAILABLE TO HELP ME QUIT SMOKING? Your health care provider can direct you to community resources or hospitals for support, which may include:  Group support.  Education.  Hypnosis.  Therapy.   This information is not intended to replace advice given to you by your health care provider. Make sure you discuss any questions you have with your health care provider.   Document Released: 10/20/2003 Document Revised: 02/11/2014 Document Reviewed: 07/09/2012 Elsevier Interactive Patient Education 2016 Elsevier Inc[employer] Chronic Obstructive Pulmonary Disease Chronic obstructive pulmonary disease (COPD) is a common lung condition in which airflow from the lungs is limited. COPD is a general term that can be used to describe many different lung problems that limit airflow, including both chronic bronchitis and emphysema. If you have COPD, your lung function will probably never return to normal, but there are measures you can take to improve lung function and make yourself feel better. CAUSES   Smoking (common).  Exposure to secondhand smoke.  Genetic problems.  Chronic  inflammatory lung diseases or recurrent infections. SYMPTOMS  Shortness of breath, especially with physical activity.  Deep, persistent (chronic) cough with a large amount of thick mucus.  Wheezing.  Rapid breaths (tachypnea).  Gray or bluish discoloration (cyanosis) of the skin, especially in your fingers, toes, or lips.  Fatigue.  Weight loss.  Frequent infections or episodes when breathing symptoms become much worse (exacerbations).  Chest tightness. DIAGNOSIS Your health care provider will take a medical history and perform a physical examination to diagnose COPD. Additional tests for COPD may include:  Lung (pulmonary) function tests.  Chest X-ray.  CT scan.  Blood tests. TREATMENT  Treatment for COPD may include:  Inhaler and nebulizer medicines. These help manage the symptoms of COPD and make your breathing more comfortable.  Supplemental oxygen. Supplemental oxygen is only helpful if you have a low oxygen level in your blood.  Exercise and physical activity. These are beneficial for nearly all people with COPD.  Lung surgery or transplant.  Nutrition therapy to gain weight, if you are underweight.  Pulmonary rehabilitation. This may involve working with a team of health   care providers and specialists, such as respiratory, occupational, and physical therapists. HOME CARE INSTRUCTIONS  Take all medicines (inhaled or pills) as directed by your health care provider.  Avoid over-the-counter medicines or cough syrups that dry up your airway (such as antihistamines) and slow down the elimination of secretions unless instructed otherwise by your health care provider.  If you are a smoker, the most important thing that you can do is stop smoking. Continuing to smoke will cause further lung damage and breathing trouble. Ask your health care provider for help with quitting smoking. He or she can direct you to community resources or hospitals that provide  support.  Avoid exposure to irritants such as smoke, chemicals, and fumes that aggravate your breathing.  Use oxygen therapy and pulmonary rehabilitation if directed by your health care provider. If you require home oxygen therapy, ask your health care provider whether you should purchase a pulse oximeter to measure your oxygen level at home.  Avoid contact with individuals who have a contagious illness.  Avoid extreme temperature and humidity changes.  Eat healthy foods. Eating smaller, more frequent meals and resting before meals may help you maintain your strength.  Stay active, but balance activity with periods of rest. Exercise and physical activity will help you maintain your ability to do things you want to do.  Preventing infection and hospitalization is very important when you have COPD. Make sure to receive all the vaccines your health care provider recommends, especially the pneumococcal and influenza vaccines. Ask your health care provider whether you need a pneumonia vaccine.  Learn and use relaxation techniques to manage stress.  Learn and use controlled breathing techniques as directed by your health care provider. Controlled breathing techniques include:  Pursed lip breathing. Start by breathing in (inhaling) through your nose for 1 second. Then, purse your lips as if you were going to whistle and breathe out (exhale) through the pursed lips for 2 seconds.  Diaphragmatic breathing. Start by putting one hand on your abdomen just above your waist. Inhale slowly through your nose. The hand on your abdomen should move out. Then purse your lips and exhale slowly. You should be able to feel the hand on your abdomen moving in as you exhale.  Learn and use controlled coughing to clear mucus from your lungs. Controlled coughing is a series of short, progressive coughs. The steps of controlled coughing are: 1. Lean your head slightly forward. 2. Breathe in deeply using diaphragmatic  breathing. 3. Try to hold your breath for 3 seconds. 4. Keep your mouth slightly open while coughing twice. 5. Spit any mucus out into a tissue. 6. Rest and repeat the steps once or twice as needed. SEEK MEDICAL CARE IF:  You are coughing up more mucus than usual.  There is a change in the color or thickness of your mucus.  Your breathing is more labored than usual.  Your breathing is faster than usual. SEEK IMMEDIATE MEDICAL CARE IF:  You have shortness of breath while you are resting.  You have shortness of breath that prevents you from:  Being able to talk.  Performing your usual physical activities.  You have chest pain lasting longer than 5 minutes.  Your skin color is more cyanotic than usual.  You measure low oxygen saturations for longer than 5 minutes with a pulse oximeter. MAKE SURE YOU:  Understand these instructions.  Will watch your condition.  Will get help right away if you are not doing well or get worse.     This information is not intended to replace advice given to you by your health care provider. Make sure you discuss any questions you have with your health care provider.   Document Released: 10/31/2004 Document Revised: 02/11/2014 Document Reviewed: 09/17/2012 Elsevier Interactive Patient Education 2016 Elsevier Inc.  

## 2015-07-20 NOTE — Progress Notes (Signed)
Patient ID: Corey Lawrence, male   DOB: [birth year], 60 y.o.   MRN: [id number]   [name], is a 60 y.o. male  [id number]  [id number]  DOB - Jul 16, 1955  Chief Complaint  Patient presents with  . COPD        Subjective:   Corey Lawrence is a 60 y.o. male with history of significant COPD with ongoing tobacco abuse and chronic low back pain here today for a follow up visit. Patient is complaining of SOB and wheezing being present for the past week. Patient claims he has significant COPD exacerbation at least once every 3 months that requires antibiotics and albuterol nebulization at home. Patient continues to smoke heavily despite repeated counseling. He is coughing repeatedly which makes his shoulder pain even worse. Cough is productive of greenish sputum. He has no fever. Patient is also complaining of left shoulder pain scaled currently at an 8/10. Pain is described as aching. He denies any trauma or fall. He claims tramadol helps with the pain. Patient has No headache, No chest pain, No abdominal pain - No Nausea, No new weakness tingling or numbness.  No problems updated.  ALLERGIES: No Known Allergies  PAST MEDICAL HISTORY: Past Medical History  Diagnosis Date  . COPD (chronic obstructive pulmonary disease) (HCC)   . Tobacco abuse   . Ankle fracture, left     "casted; no OR"  . Chronic lower back pain     MEDICATIONS AT HOME: Prior to Admission medications   Medication Sig Start Date End Date Taking? Authorizing Provider  acetaminophen (TYLENOL) 325 MG tablet Take 2 tablets (650 mg total) by mouth every 6 (six) hours as needed for mild pain (or Fever >/= 101). 09/23/13  Yes Christiane Ha, MD  albuterol (PROVENTIL HFA;VENTOLIN HFA) 108 (90 Base) MCG/ACT inhaler Inhale 1-2 puffs into the lungs every 6 (six) hours as needed for wheezing or shortness of breath. 07/13/15  Yes Quentin Angst, MD  mometasone-formoterol (DULERA) 100-5 MCG/ACT AERO Inhale 2 puffs into  the lungs daily. 07/20/15  Yes Quentin Angst, MD  Multiple Vitamins-Minerals (MULTIVITAMIN ADULTS 50+) TABS Take 1 tablet by mouth daily.   Yes Historical Provider, MD  naproxen (NAPROSYN) 500 MG tablet Take 1 tablet (500 mg total) by mouth 2 (two) times daily with a meal. 12/05/14  Yes Radames Mejorado E Hyman Hopes, MD  tiotropium (SPIRIVA HANDIHALER) 18 MCG inhalation capsule Place 1 capsule (18 mcg total) into inhaler and inhale daily. 07/20/15  Yes Autym Siess Annitta Needs, MD  traMADol (ULTRAM) 50 MG tablet Take 1 tablet (50 mg total) by mouth every 8 (eight) hours as needed for moderate pain. 05/25/15  Yes Quentin Angst, MD  albuterol (PROVENTIL) (2.5 MG/3ML) 0.083% nebulizer solution Take 3 mLs (2.5 mg total) by nebulization every 6 (six) hours as needed for wheezing or shortness of breath. 07/20/15   Quentin Angst, MD  doxycycline (VIBRA-TABS) 100 MG tablet Take 1 tablet (100 mg total) by mouth 2 (two) times daily. 07/20/15   Quentin Angst, MD  methylPREDNISolone sodium succinate (SOLU-MEDROL) 125 mg/2 mL injection Inject 2 mLs (125 mg total) into the muscle once. 07/20/15   Quentin Angst, MD  predniSONE (DELTASONE) 10 MG tablet Take 6 tablets daily for 3 days, then 5 tablets for 3 days, then 4 tablets for 3 days then 3 tablets for 3 days then 2 tablets for 3 days and then 1 tablet for 5 days 07/20/15   Quentin Angst, MD  Objective:   Filed Vitals:   07/20/15 1449  BP: 112/66  Pulse: 72  Temp: 98.7 F (37.1 C)  TempSrc: Oral  Resp: 18  Height: 5\' 4"  (1.626 m)  Weight: 136 lb 9.6 oz (61.961 kg)  SpO2: 96%    Exam General appearance : Awake, alert, not in any distress. Speech Clear. Not toxic looking HEENT: Atraumatic and Normocephalic, pupils equally reactive to light and accomodation Neck: Supple, no JVD. No cervical lymphadenopathy.  Chest: Poor air entry bilaterally, scattered wheezes and rhonchi, no added sounds  CVS: S1 S2 regular, no murmurs.  Abdomen:  Bowel sounds present, Non tender and not distended with no gaurding, rigidity or rebound. Extremities: B/L Lower Ext shows no edema, both legs are warm to touch Neurology: Awake alert, and oriented X 3, CN II-XII intact, Non focal Skin: No Rash  Data Review Lab Results  Component Value Date   HGBA1C 5.70 02/20/2015   HGBA1C 5.60 12/05/2014   HGBA1C 5.9* 09/22/2013     Assessment & Plan   1. COPD exacerbation (HCC)  - mometasone-formoterol (DULERA) 100-5 MCG/ACT AERO; Inhale 2 puffs into the lungs daily.  Dispense: 1 Inhaler; Refill: 2 - tiotropium (SPIRIVA HANDIHALER) 18 MCG inhalation capsule; Place 1 capsule (18 mcg total) into inhaler and inhale daily.  Dispense: 90 capsule; Refill: 3 - methylPREDNISolone sodium succinate (SOLU-MEDROL) 125 mg/2 mL injection; Inject 2 mLs (125 mg total) into the muscle once.  Dispense: 1 each; Refill: 0 - doxycycline (VIBRA-TABS) 100 MG tablet; Take 1 tablet (100 mg total) by mouth 2 (two) times daily.  Dispense: 20 tablet; Refill: 0 - albuterol (PROVENTIL) (2.5 MG/3ML) 0.083% nebulizer solution; Take 3 mLs (2.5 mg total) by nebulization every 6 (six) hours as needed for wheezing or shortness of breath.  Dispense: 150 mL; Refill: 1 - predniSONE (DELTASONE) 10 MG tablet; Take 6 tablets daily for 3 days, then 5 tablets for 3 days, then 4 tablets for 3 days then 3 tablets for 3 days then 2 tablets for 3 days and then 1 tablet for 5 days  Dispense: 65 tablet; Refill: 0  2. Smoking addiction  Onalee HuaDavid was counseled on the dangers of tobacco use, and was advised to quit. Reviewed strategies to maximize success, including removing cigarettes and smoking materials from environment, stress management and support of family/friends.  Patient have been counseled extensively about nutrition and exercise  Return in about 3 months (around 10/20/2015), or if symptoms worsen or fail to improve, for COPD.  The patient was given clear instructions to go to ER or return  to medical center if symptoms don't improve, worsen or new problems develop. The patient verbalized understanding. The patient was told to call to get lab results if they haven't heard anything in the next week.   This note has been created with Education officer, environmentalDragon speech recognition software and smart phrase technology. Any transcriptional errors are unintentional.    Jeanann LewandowskyJEGEDE, Reef Achterberg, MD, MHA, Maxwell CaulFACP, FAAP, CPE [facility] [place], [place] [phone]   07/20/2015, 3:33 PM

## 2015-07-28 ENCOUNTER — Other Ambulatory Visit: Payer: Self-pay | Admitting: *Deleted

## 2015-07-28 DIAGNOSIS — J441 Chronic obstructive pulmonary disease with (acute) exacerbation: Secondary | ICD-10-CM

## 2015-07-28 MED ORDER — TIOTROPIUM BROMIDE MONOHYDRATE 18 MCG IN CAPS: 18.0000 ug | ORAL_CAPSULE | Freq: Every day | RESPIRATORY_TRACT | Status: DC

## 2015-07-28 MED ORDER — ALBUTEROL SULFATE HFA 108 (90 BASE) MCG/ACT IN AERS: 1.0000 | INHALATION_SPRAY | Freq: Four times a day (QID) | RESPIRATORY_TRACT | Status: DC | PRN

## 2015-07-28 MED ORDER — MOMETASONE FURO-FORMOTEROL FUM 100-5 MCG/ACT IN AERO: 2.0000 | INHALATION_SPRAY | Freq: Every day | RESPIRATORY_TRACT | Status: DC

## 2015-07-28 NOTE — Telephone Encounter (Signed)
PASS PROGRAM 

## 2015-08-10 ENCOUNTER — Telehealth: Payer: Self-pay | Admitting: Internal Medicine

## 2015-08-10 NOTE — Telephone Encounter (Signed)
Patient came in to request a medication refill for Tramadol. Patient had OV on 6/15. Please follow up.

## 2015-08-15 ENCOUNTER — Other Ambulatory Visit: Payer: Self-pay | Admitting: *Deleted

## 2015-08-15 DIAGNOSIS — M25512 Pain in left shoulder: Secondary | ICD-10-CM

## 2015-08-17 MED ORDER — TRAMADOL HCL 50 MG PO TABS: 50.0000 mg | ORAL_TABLET | Freq: Three times a day (TID) | ORAL | Status: DC | PRN

## 2015-08-18 NOTE — Telephone Encounter (Signed)
Awaiting provider's signature.

## 2015-08-21 MED FILL — VENTOLIN HFA 90 MCG INHALER: 108 (90 BAS | 20 days supply | Qty: 18 | Fill #1

## 2015-08-21 MED FILL — IPRAT-ALBUT 0.5-3(2.5) MG/3: 0.5-2.5 (3) | 10 days supply | Qty: 180 | Fill #7

## 2015-08-28 MED FILL — !DULERA 100 MCG/5 MCG INH: 100-5 | 60 days supply | Qty: 13 | Fill #0

## 2015-09-06 MED FILL — traMADol HCL 50 MG TABS: 50 | 20 days supply | Qty: 60 | Fill #0

## 2015-11-14 ENCOUNTER — Other Ambulatory Visit: Payer: Self-pay | Admitting: Internal Medicine

## 2015-11-14 MED FILL — IPRAT-ALBUT 0.5-3(2.5) MG/3: 0.5-2.5 (3) | 20 days supply | Qty: 360 | Fill #0

## 2015-11-14 MED FILL — VENTOLIN HFA 90 MCG INHALER: 108 (90 BAS | 20 days supply | Qty: 18 | Fill #2

## 2015-11-14 MED FILL — DULERA 100 MCG/5 MCG INH: 100-5 | 60 days supply | Qty: 13 | Fill #1

## 2016-05-07 ENCOUNTER — Emergency Department (HOSPITAL_COMMUNITY)
Admission: EM | Admit: 2016-05-07 | Discharge: 2016-05-07 | Disposition: A | Discharge status: Home / Self Care | Payer: Self-pay | Attending: Emergency Medicine | Admitting: Emergency Medicine

## 2016-05-07 ENCOUNTER — Emergency Department (HOSPITAL_COMMUNITY): Payer: Self-pay

## 2016-05-07 ENCOUNTER — Encounter (HOSPITAL_COMMUNITY): Payer: Self-pay | Admitting: Emergency Medicine

## 2016-05-07 DIAGNOSIS — J441 Chronic obstructive pulmonary disease with (acute) exacerbation: Secondary | ICD-10-CM | POA: Insufficient documentation

## 2016-05-07 DIAGNOSIS — F17210 Nicotine dependence, cigarettes, uncomplicated: Secondary | ICD-10-CM | POA: Insufficient documentation

## 2016-05-07 DIAGNOSIS — Z79899 Other long term (current) drug therapy: Secondary | ICD-10-CM | POA: Insufficient documentation

## 2016-05-07 MED ORDER — ALBUTEROL SULFATE HFA 108 (90 BASE) MCG/ACT IN AERS: 1.0000 | INHALATION_SPRAY | Freq: Four times a day (QID) | RESPIRATORY_TRACT | 1 refills | Status: DC | PRN

## 2016-05-07 MED ORDER — PREDNISONE 20 MG PO TABS
60.0000 mg | ORAL_TABLET | Freq: Once | ORAL | Status: AC
  Administered 2016-05-07: 60 mg via ORAL
  Filled 2016-05-07: qty 3

## 2016-05-07 MED ORDER — ALBUTEROL SULFATE HFA 108 (90 BASE) MCG/ACT IN AERS
2.0000 | INHALATION_SPRAY | Freq: Once | RESPIRATORY_TRACT | Status: AC
  Administered 2016-05-07: 2 via RESPIRATORY_TRACT
  Filled 2016-05-07: qty 6.7

## 2016-05-07 MED ORDER — IPRATROPIUM-ALBUTEROL 0.5-2.5 (3) MG/3ML IN SOLN
RESPIRATORY_TRACT | Status: AC
  Filled 2016-05-07: qty 3

## 2016-05-07 MED ORDER — ALBUTEROL SULFATE (2.5 MG/3ML) 0.083% IN NEBU: 2.5000 mg | INHALATION_SOLUTION | Freq: Four times a day (QID) | RESPIRATORY_TRACT | 1 refills | Status: DC | PRN

## 2016-05-07 MED ORDER — IPRATROPIUM-ALBUTEROL 0.5-2.5 (3) MG/3ML IN SOLN
3.0000 mL | Freq: Once | RESPIRATORY_TRACT | Status: AC
  Administered 2016-05-07: 3 mL via RESPIRATORY_TRACT

## 2016-05-07 MED ORDER — PREDNISONE 20 MG PO TABS: ORAL_TABLET | ORAL | 0 refills | Status: DC

## 2016-05-07 MED FILL — !VENTOLIN HFA INHALER: 108 (90 BAS | 25 days supply | Qty: 18 | Fill #0

## 2016-05-07 MED FILL — ALBUTEROL 0.083% INHAL SOLN: (2.5 MG/3ML | 15 days supply | Qty: 180 | Fill #0

## 2016-05-07 MED FILL — ?PREDNISONE 20 MG TABLET: 20 | 15 days supply | Qty: 21 | Fill #0

## 2016-05-07 NOTE — ED Provider Notes (Signed)
[facility]-EMERGENCY DEPT Provider Note   CSN: [id number] Arrival date & time: 05/07/16  1215     History   Chief Complaint Chief Complaint  Patient presents with  . Shortness of Breath    HPI Corey Lawrence is a 61 y.o. male.  HPI  61 year old male with a history of COPD presents with worsening shortness of breath. Patient has had dyspnea for 3 weeks. Worse than his typical. He states he has "stop smoking" and has only had 3 cigarettes in the last 3 weeks. Has had a cough with occasional sputum. Fever a few days ago but none recently. Has been feeling short of breath and wheezy. Has taken his albuterol and inhaler with transient relief but then needs quickly after that. He feels like he needs steroids again. Feels like prior COPD exacerbations. Given a DuoNeb in triage and now feels much better. Feels ready to be discharged with medicines.  Past Medical History:  Diagnosis Date  . Ankle fracture, left    "casted; no OR"  . Chronic lower back pain   . COPD (chronic obstructive pulmonary disease) (HCC)   . Tobacco abuse     Patient Active Problem List   Diagnosis Date Noted  . Left shoulder pain 05/25/2015  . Chronic obstructive pulmonary disease, unspecified copd, unspecified chronic bronchitis type 02/20/2015  . Left wrist pain 12/05/2014  . Wheezing 12/05/2014  . Smoking addiction 12/05/2014  . Prediabetes 09/29/2013  . Chronic low back pain 09/29/2013  . Smoking 09/29/2013  . COPD (chronic obstructive pulmonary disease) (HCC) 09/21/2013  . COPD exacerbation (HCC) 09/21/2013  . Cigarette nicotine dependence with withdrawal 09/21/2013  . Acute respiratory failure with hypoxia (HCC) 09/21/2013  . Chest pressure 09/21/2013    Past Surgical History:  Procedure Laterality Date  . INCISION AND DRAINAGE OF WOUND Left ~ 1977   "splinter got infected between" digits 2 & 3       Home Medications    Prior to Admission medications   Medication Sig Start Date End Date  Taking? Authorizing Provider  acetaminophen (TYLENOL) 325 MG tablet Take 2 tablets (650 mg total) by mouth every 6 (six) hours as needed for mild pain (or Fever >/= 101). 09/23/13   Christiane Ha, MD  albuterol (PROVENTIL HFA;VENTOLIN HFA) 108 (90 Base) MCG/ACT inhaler Inhale 1-2 puffs into the lungs every 6 (six) hours as needed for wheezing or shortness of breath. 05/07/16   Pricilla Loveless, MD  albuterol (PROVENTIL) (2.5 MG/3ML) 0.083% nebulizer solution Take 3 mLs (2.5 mg total) by nebulization every 6 (six) hours as needed for wheezing or shortness of breath. 05/07/16   Pricilla Loveless, MD  doxycycline (VIBRA-TABS) 100 MG tablet Take 1 tablet (100 mg total) by mouth 2 (two) times daily. 07/20/15   Quentin Angst, MD  ipratropium-albuterol (DUONEB) 0.5-2.5 (3) MG/3ML SOLN USE 3 MLS BY NEBULIZATION EVERY 4 HOURS AS NEEDED FOR WHEEZING. 11/14/15   Quentin Angst, MD  mometasone-formoterol (DULERA) 100-5 MCG/ACT AERO Inhale 2 puffs into the lungs daily. 07/28/15   Quentin Angst, MD  Multiple Vitamins-Minerals (MULTIVITAMIN ADULTS 50+) TABS Take 1 tablet by mouth daily.    Historical Provider, MD  naproxen (NAPROSYN) 500 MG tablet Take 1 tablet (500 mg total) by mouth 2 (two) times daily with a meal. 12/05/14   Olugbemiga E Hyman Hopes, MD  predniSONE (DELTASONE) 20 MG tablet 3 tabs po daily x 2 days, then 2 tabs x 3 days, then 1.5 tabs x 3 days, then 1 tab  x 3 days, then 0.5 tabs x 3 days 05/08/16   Pricilla Loveless, MD  tiotropium (SPIRIVA HANDIHALER) 18 MCG inhalation capsule Place 1 capsule (18 mcg total) into inhaler and inhale daily. 07/28/15   Quentin Angst, MD  traMADol (ULTRAM) 50 MG tablet Take 1 tablet (50 mg total) by mouth every 8 (eight) hours as needed for moderate pain. 08/17/15   Quentin Angst, MD    Family History Family History  Problem Relation Age of Onset  . Esophageal cancer Father   . Colon cancer Sister   . Cancer - Other Brother   . Heart disease Mother   .  Diabetes Mellitus II Mother   . Heart disease      multiple maternal family members  . Diabetes Mellitus II      multiple maternal family members    Social History Social History  Substance Use Topics  . Smoking status: Current Every Day Smoker    Packs/day: 0.25    Years: 43.00    Types: Cigarettes  . Smokeless tobacco: Never Used     Comment: currently on patch  . 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   Review of Systems Review of Systems  Constitutional: Negative for fever (a few days ago, none recently).  Respiratory: Positive for cough, shortness of breath and wheezing.   Cardiovascular: Positive for chest pain (with coughing).  All other systems reviewed and are negative.    Physical Exam Updated Vital Signs BP 140/82   Pulse 64   Temp 97.7 F (36.5 C) (Oral)   Resp 13   Ht  (1.626 m)   Wt 132 lb (59.9 kg)   SpO2 93%   BMI 22.66 kg/m   Physical Exam  Constitutional: He is oriented to person, place, and time. He appears well-developed and well-nourished. No distress.  HENT:  Head: Normocephalic and atraumatic.  Right Ear: External ear normal.  Left Ear: External ear normal.  Nose: Nose normal.  Mouth/Throat: Oropharynx is clear and moist.  Eyes: Right eye exhibits no discharge. Left eye exhibits no discharge.  Neck: Neck supple.  Cardiovascular: Normal rate, regular rhythm and normal heart sounds.   Pulmonary/Chest: Effort normal. No accessory muscle usage. No tachypnea. No respiratory distress. He has wheezes (mild, expiratory).  Speaks in complete sentences without acute dyspnea  Abdominal: Soft. There is no tenderness.  Musculoskeletal: He exhibits no edema.  Neurological: He is alert and oriented to person, place, and time.  Skin: Skin is warm and dry. He is not diaphoretic.  Nursing note and vitals reviewed.    ED Treatments / Results  Labs (all labs ordered are  listed, but only abnormal results are displayed) Labs Reviewed - No data to display  EKG  EKG Interpretation None       Radiology Dg Chest 2 View  Result Date: 05/07/2016 CLINICAL DATA:  History of COPD Increase shortness of breath since running out of nebulizer treatment medication 2 weeks ago. The patient is coughing ended distress. Patient also reports left-sided chest pressure. Recently discontinued smoking. EXAM: CHEST  2 VIEW COMPARISON:  PA and lateral chest x-ray of May 08, 2015 FINDINGS: The lungs remain hyperinflated. There is no focal infiltrate. There is no pleural effusion. The heart and pulmonary vascularity are normal. The mediastinum is normal in width. There is calcification in the wall of the aortic arch. The bony thorax exhibits  no acute abnormality. IMPRESSION: COPD.  No pneumonia nor other acute cardiopulmonary abnormality. Thoracic aortic atherosclerosis. Electronically Signed   By: Miki  [place] M.D.   On: 05/07/2016 12:53    Procedures Procedures (including critical care time)  Medications Ordered in ED Medications  ipratropium-albuterol (DUONEB) 0.5-2.5 (3) MG/3ML nebulizer solution (not administered)  ipratropium-albuterol (DUONEB) 0.5-2.5 (3) MG/3ML nebulizer solution 3 mL (3 mLs Nebulization Given 05/07/16 1222)  albuterol (PROVENTIL HFA;VENTOLIN HFA) 108 (90 Base) MCG/ACT inhaler 2 puff (2 puffs Inhalation Given 05/07/16 1450)  predniSONE (DELTASONE) tablet 60 mg (60 mg Oral Given 05/07/16 1450)     Initial Impression / Assessment and Plan / ED Course  I have reviewed the triage vital signs and the nursing notes.  Pertinent labs & imaging results that were available during my care of the patient were reviewed by me and considered in my medical decision making (see chart for details).     Patient appears to have a subacute/prolonged COPD exacerbation. Currently feels well despite having mild wheezes. Was given an albuterol inhaler here in the ER to take home  as well as a refill of his albuterol nebulizer and inhaler. Given how long his symptoms have been, will give a two-week taper of prednisone. First dose given in the ED. Don't think this represents a bacterial process and thus will not give antibiotics at this time. Highly doubt PE or ACS. Appears stable and well-appearing currently. Follow-up with PCP. Discussed return precautions.  Final Clinical Impressions(s) / ED Diagnoses   Final diagnoses:  COPD exacerbation [facility])    New Prescriptions Discharge Medication List as of 05/07/2016  3:08 PM       Pricilla Loveless, MD 05/07/16 1521

## 2016-05-07 NOTE — ED Triage Notes (Signed)
Pt sts increased SOB since running out of neb treatments; pt coughing and distressed

## 2016-05-22 ENCOUNTER — Encounter: Payer: Self-pay | Admitting: Internal Medicine

## 2016-05-22 ENCOUNTER — Ambulatory Visit: Payer: Self-pay | Attending: Internal Medicine | Admitting: Internal Medicine

## 2016-05-22 VITALS — BP 105/62 | HR 83 | Temp 97.4°F | Resp 18 | Ht 65.0 in | Wt 133.0 lb

## 2016-05-22 DIAGNOSIS — F411 Generalized anxiety disorder: Secondary | ICD-10-CM

## 2016-05-22 DIAGNOSIS — J441 Chronic obstructive pulmonary disease with (acute) exacerbation: Secondary | ICD-10-CM

## 2016-05-22 DIAGNOSIS — R7303 Prediabetes: Secondary | ICD-10-CM

## 2016-05-22 DIAGNOSIS — Z87891 Personal history of nicotine dependence: Secondary | ICD-10-CM | POA: Insufficient documentation

## 2016-05-22 DIAGNOSIS — H538 Other visual disturbances: Secondary | ICD-10-CM

## 2016-05-22 DIAGNOSIS — Z79899 Other long term (current) drug therapy: Secondary | ICD-10-CM | POA: Insufficient documentation

## 2016-05-22 DIAGNOSIS — G8929 Other chronic pain: Secondary | ICD-10-CM

## 2016-05-22 DIAGNOSIS — M25512 Pain in left shoulder: Secondary | ICD-10-CM | POA: Insufficient documentation

## 2016-05-22 LAB — POCT GLYCOSYLATED HEMOGLOBIN (HGB A1C): HEMOGLOBIN A1C: 5.8

## 2016-05-22 MED ORDER — TRAMADOL HCL 50 MG PO TABS: 50.0000 mg | ORAL_TABLET | Freq: Three times a day (TID) | ORAL | 0 refills | Status: DC | PRN

## 2016-05-22 MED ORDER — IPRATROPIUM-ALBUTEROL 0.5-2.5 (3) MG/3ML IN SOLN
3.0000 mL | Freq: Once | RESPIRATORY_TRACT | Status: AC
  Administered 2016-05-22: 3 mL via RESPIRATORY_TRACT

## 2016-05-22 MED ORDER — METHYLPREDNISOLONE SODIUM SUCC 125 MG IJ SOLR
125.0000 mg | Freq: Once | INTRAMUSCULAR | Status: AC
  Administered 2016-05-22: 125 mg via INTRAMUSCULAR

## 2016-05-22 MED ORDER — BUSPIRONE HCL 15 MG PO TABS: 15.0000 mg | ORAL_TABLET | Freq: Three times a day (TID) | ORAL | 3 refills | Status: DC

## 2016-05-22 MED ORDER — IPRATROPIUM-ALBUTEROL 0.5-2.5 (3) MG/3ML IN SOLN: RESPIRATORY_TRACT | 0 refills | Status: DC

## 2016-05-22 MED ORDER — MOMETASONE FURO-FORMOTEROL FUM 100-5 MCG/ACT IN AERO: 2.0000 | INHALATION_SPRAY | Freq: Every day | RESPIRATORY_TRACT | 3 refills | Status: DC

## 2016-05-22 MED ORDER — PREDNISONE 20 MG PO TABS: ORAL_TABLET | ORAL | 0 refills | Status: DC

## 2016-05-22 MED FILL — !DULERA 100 MCG/5 MCG INH: 100-5 | 30 days supply | Qty: 13 | Fill #0

## 2016-05-22 MED FILL — busPIRone HCL 15 MG TABS: 15 | 20 days supply | Qty: 60 | Fill #0

## 2016-05-22 MED FILL — IPRAT-ALBUT 0.5-3(2.5) MG/3: 0.5-2.5 (3) | 20 days supply | Qty: 360 | Fill #0

## 2016-05-22 MED FILL — ?PREDNISONE 20 MG TABLET: 20 | 14 days supply | Qty: 21 | Fill #0

## 2016-05-22 NOTE — Patient Instructions (Signed)
Generalized Anxiety Disorder, Adult Generalized anxiety disorder (GAD) is a mental health disorder. People with this condition constantly worry about everyday events. Unlike normal anxiety, worry related to GAD is not triggered by a specific event. These worries also do not fade or get better with time. GAD interferes with life functions, including relationships, work, and school. GAD can vary from mild to severe. People with severe GAD can have intense waves of anxiety with physical symptoms (panic attacks). What are the causes? The exact cause of GAD is not known. What increases the risk? This condition is more likely to develop in:  Women.  People who have a family history of anxiety disorders.  People who are very shy.  People who experience very stressful life events, such as the death of a loved one.  People who have a very stressful family environment. What are the signs or symptoms? People with GAD often worry excessively about many things in their lives, such as their health and family. They may also be overly concerned about:  Doing well at work.  Being on time.  Natural disasters.  Friendships. Physical symptoms of GAD include:  Fatigue.  Muscle tension or having muscle twitches.  Trembling or feeling shaky.  Being easily startled.  Feeling like your heart is pounding or racing.  Feeling out of breath or like you cannot take a deep breath.  Having trouble falling asleep or staying asleep.  Sweating.  Nausea, diarrhea, or irritable bowel syndrome (IBS).  Headaches.  Trouble concentrating or remembering facts.  Restlessness.  Irritability. How is this diagnosed? Your health care provider can diagnose GAD based on your symptoms and medical history. You will also have a physical exam. The health care provider will ask specific questions about your symptoms, including how severe they are, when they started, and if they come and go. Your health care  provider may ask you about your use of alcohol or drugs, including prescription medicines. Your health care provider may refer you to a mental health specialist for further evaluation. Your health care provider will do a thorough examination and may perform additional tests to rule out other possible causes of your symptoms. To be diagnosed with GAD, a person must have anxiety that:  Is out of his or her control.  Affects several different aspects of his or her life, such as work and relationships.  Causes distress that makes him or her unable to take part in normal activities.  Includes at least three physical symptoms of GAD, such as restlessness, fatigue, trouble concentrating, irritability, muscle tension, or sleep problems. Before your health care provider can confirm a diagnosis of GAD, these symptoms must be present more days than they are not, and they must last for six months or longer. How is this treated? The following therapies are usually used to treat GAD:  Medicine. Antidepressant medicine is usually prescribed for long-term daily control. Antianxiety medicines may be added in severe cases, especially when panic attacks occur.  Talk therapy (psychotherapy). Certain types of talk therapy can be helpful in treating GAD by providing support, education, and guidance. Options include:  Cognitive behavioral therapy (CBT). People learn coping skills and techniques to ease their anxiety. They learn to identify unrealistic or negative thoughts and behaviors and to replace them with positive ones.  Acceptance and commitment therapy (ACT). This treatment teaches people how to be mindful as a way to cope with unwanted thoughts and feelings.  Biofeedback. This process trains you to manage your body's response (  physiological response) through breathing techniques and relaxation methods. You will work with a therapist while machines are used to monitor your physical symptoms.  Stress  management techniques. These include yoga, meditation, and exercise. A mental health specialist can help determine which treatment is best for you. Some people see improvement with one type of therapy. However, other people require a combination of therapies. Follow these instructions at home:  Take over-the-counter and prescription medicines only as told by your health care provider.  Try to maintain a normal routine.  Try to anticipate stressful situations and allow extra time to manage them.  Practice any stress management or self-calming techniques as taught by your health care provider.  Do not punish yourself for setbacks or for not making progress.  Try to recognize your accomplishments, even if they are small.  Keep all follow-up visits as told by your health care provider. This is important. Contact a health care provider if:  Your symptoms do not get better.  Your symptoms get worse.  You have signs of depression, such as:  A persistently sad, cranky, or irritable mood.  Loss of enjoyment in activities that used to bring you joy.  Change in weight or eating.  Changes in sleeping habits.  Avoiding friends or family members.  Loss of energy for normal tasks.  Feelings of guilt or worthlessness. Get help right away if:  You have serious thoughts about hurting yourself or others. If you ever feel like you may hurt yourself or others, or have thoughts about taking your own life, get help right away. You can go to your nearest emergency department or call:  Your local emergency services (911 in the [place]  A suicide crisis helpline, such as the National Suicide Prevention Lifeline at (248)405-0073. This is open 24 hours a day. Summary  Generalized anxiety disorder (GAD) is a mental health disorder that involves worry that is not triggered by a specific event.  People with GAD often worry excessively about many things in their lives, such as their health and  family.  GAD may cause physical symptoms such as restlessness, trouble concentrating, sleep problems, frequent sweating, nausea, diarrhea, headaches, and trembling or muscle twitching.  A mental health specialist can help determine which treatment is best for you. Some people see improvement with one type of therapy. However, other people require a combination of therapies. This information is not intended to replace advice given to you by your health care provider. Make sure you discuss any questions you have with your health care provider. Document Released: 05/18/2012 Document Revised: 12/12/2015 Document Reviewed: 12/12/2015 Elsevier Interactive Patient Education  2017 [employer] Chronic Obstructive Pulmonary Disease Chronic obstructive pulmonary disease (COPD) is a long-term (chronic) lung problem. When you have COPD, it is hard for air to get in and out of your lungs. The way your lungs work will never return to normal. Usually the condition gets worse over time. There are things you can do to keep yourself as healthy as possible. Your doctor may treat your condition with:  Medicines.  Quitting smoking, if you smoke.  Rehabilitation. This may involve a team of specialists.  Oxygen.  Exercise and changes to your diet.  Lung surgery.  Comfort measures (palliative care). Follow these instructions at home: Medicines   Take over-the-counter and prescription medicines only as told by your doctor.  Talk to your doctor before taking any cough or allergy medicines. You may need to avoid medicines that cause your lungs to be dry. Lifestyle  If you smoke, stop. Smoking makes the problem worse. If you need help quitting, ask your doctor.  Avoid being around things that make your breathing worse. This may include smoke, chemicals, and fumes.  Stay active, but remember to also rest.  Learn and use tips on how to relax.  Make sure you get enough sleep. Most adults need at least 7  hours a night.  Eat healthy foods. Eat smaller meals more often. Rest before meals. Controlled breathing   Learn and use tips on how to control your breathing as told by your doctor. Try:  Breathing in (inhaling) through your nose for 1 second. Then, pucker your lips and breath out (exhale) through your lips for 2 seconds.  Putting one hand on your belly (abdomen). Breathe in slowly through your nose for 1 second. Your hand on your belly should move out. Pucker your lips and breathe out slowly through your lips. Your hand on your belly should move in as you breathe out. Controlled coughing   Learn and use controlled coughing to clear mucus from your lungs. The steps are: 1. Lean your head a little forward. 2. Breathe in deeply. 3. Try to hold your breath for 3 seconds. 4. Keep your mouth slightly open while coughing 2 times. 5. Spit any mucus out into a tissue. 6. Rest and do the steps again 1 or 2 times as needed. General instructions   Make sure you get all the shots (vaccines) that your doctor recommends. Ask your doctor about a flu shot and a pneumonia shot.  Use oxygen therapy and therapy to help improve your lungs (pulmonary rehabilitation) if told by your doctor. If you need home oxygen therapy, ask your doctor if you should buy a tool to measure your oxygen level (oximeter).  Make a COPD action plan with your doctor. This helps you know what to do if you feel worse than usual.  Manage any other conditions you have as told by your doctor.  Avoid going outside when it is very hot, cold, or humid.  Avoid people who have a sickness you can catch (contagious).  Keep all follow-up visits as told by your doctor. This is important. Contact a doctor if:  You cough up more mucus than usual.  There is a change in the color or thickness of the mucus.  It is harder to breathe than usual.  Your breathing is faster than usual.  You have trouble sleeping.  You need to use your  medicines more often than usual.  You have trouble doing your normal activities such as getting dressed or walking around the house. Get help right away if:  You have shortness of breath while resting.  You have shortness of breath that stops you from:  Being able to talk.  Doing normal activities.  Your chest hurts for longer than 5 minutes.  Your skin color is more blue than usual.  Your pulse oximeter shows that you have low oxygen for longer than 5 minutes.  You have a fever.  You feel too tired to breathe normally. Summary  Chronic obstructive pulmonary disease (COPD) is a long-term lung problem.  The way your lungs work will never return to normal. Usually the condition gets worse over time. There are things you can do to keep yourself as healthy as possible.  Take over-the-counter and prescription medicines only as told by your doctor.  If you smoke, stop. Smoking makes the problem worse. This information is not intended to replace  advice given to you by your health care provider. Make sure you discuss any questions you have with your health care provider. Document Released: 07/10/2007 Document Revised: 06/29/2015 Document Reviewed: 09/17/2012 Elsevier Interactive Patient Education  2017 [employer].
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
## 2016-07-15 ENCOUNTER — Telehealth: Payer: Self-pay | Admitting: Internal Medicine

## 2016-07-15 MED FILL — !VENTOLIN HFA INHALER: 108 (90 BAS | 25 days supply | Qty: 18 | Fill #1

## 2016-07-15 MED FILL — DULERA 100 MCG/5 MCG INH: 100-5 | 60 days supply | Qty: 13 | Fill #1

## 2016-07-15 NOTE — Telephone Encounter (Signed)
Pt came in to request a refill of his tramadol. Please f/u.

## 2016-07-22 ENCOUNTER — Other Ambulatory Visit: Payer: Self-pay | Admitting: Internal Medicine

## 2016-07-22 DIAGNOSIS — G8929 Other chronic pain: Secondary | ICD-10-CM

## 2016-07-22 DIAGNOSIS — M25512 Pain in left shoulder: Principal | ICD-10-CM

## 2016-07-22 MED ORDER — TRAMADOL HCL 50 MG PO TABS: 50.0000 mg | ORAL_TABLET | Freq: Three times a day (TID) | ORAL | 0 refills | Status: DC | PRN

## 2016-07-22 NOTE — Telephone Encounter (Signed)
Refilled

## 2016-07-25 ENCOUNTER — Ambulatory Visit: Payer: Self-pay | Attending: Internal Medicine

## 2016-07-31 ENCOUNTER — Ambulatory Visit: Payer: Self-pay | Admitting: Internal Medicine

## 2016-08-13 ENCOUNTER — Other Ambulatory Visit: Payer: Self-pay | Admitting: *Deleted

## 2016-08-13 DIAGNOSIS — J441 Chronic obstructive pulmonary disease with (acute) exacerbation: Secondary | ICD-10-CM

## 2016-08-13 MED ORDER — FLUTICASONE FUROATE-VILANTEROL 100-25 MCG/INH IN AEPB: 1.0000 | INHALATION_SPRAY | Freq: Every day | RESPIRATORY_TRACT | 3 refills | Status: DC

## 2016-08-13 MED ORDER — ALBUTEROL SULFATE HFA 108 (90 BASE) MCG/ACT IN AERS: 1.0000 | INHALATION_SPRAY | Freq: Four times a day (QID) | RESPIRATORY_TRACT | 3 refills | Status: DC | PRN

## 2016-08-13 MED ORDER — MOMETASONE FURO-FORMOTEROL FUM 100-5 MCG/ACT IN AERO: 2.0000 | INHALATION_SPRAY | Freq: Every day | RESPIRATORY_TRACT | 3 refills | Status: DC

## 2016-08-13 NOTE — Telephone Encounter (Signed)
PRINTED FOR PASS PROGRAM 

## 2016-08-14 ENCOUNTER — Encounter: Payer: Self-pay | Admitting: Internal Medicine

## 2016-08-14 ENCOUNTER — Other Ambulatory Visit: Payer: Self-pay

## 2016-08-14 ENCOUNTER — Ambulatory Visit: Payer: Self-pay | Attending: Internal Medicine | Admitting: Internal Medicine

## 2016-08-14 VITALS — BP 119/64 | HR 73 | Temp 98.5°F | Resp 18 | Ht 64.0 in | Wt 131.0 lb

## 2016-08-14 DIAGNOSIS — J441 Chronic obstructive pulmonary disease with (acute) exacerbation: Secondary | ICD-10-CM

## 2016-08-14 DIAGNOSIS — F411 Generalized anxiety disorder: Secondary | ICD-10-CM | POA: Insufficient documentation

## 2016-08-14 DIAGNOSIS — E785 Hyperlipidemia, unspecified: Secondary | ICD-10-CM

## 2016-08-14 DIAGNOSIS — G8929 Other chronic pain: Secondary | ICD-10-CM

## 2016-08-14 DIAGNOSIS — M25512 Pain in left shoulder: Secondary | ICD-10-CM

## 2016-08-14 MED ORDER — MOMETASONE FURO-FORMOTEROL FUM 100-5 MCG/ACT IN AERO: 2.0000 | INHALATION_SPRAY | Freq: Every day | RESPIRATORY_TRACT | 3 refills | Status: DC

## 2016-08-14 MED ORDER — HYDROXYZINE HCL 25 MG PO TABS: 25.0000 mg | ORAL_TABLET | Freq: Three times a day (TID) | ORAL | 3 refills | Status: DC | PRN

## 2016-08-14 MED ORDER — ALBUTEROL SULFATE HFA 108 (90 BASE) MCG/ACT IN AERS: 1.0000 | INHALATION_SPRAY | Freq: Four times a day (QID) | RESPIRATORY_TRACT | 3 refills | Status: DC | PRN

## 2016-08-14 MED ORDER — PREDNISONE 20 MG PO TABS: ORAL_TABLET | ORAL | 0 refills | Status: DC

## 2016-08-14 MED ORDER — METHYLPREDNISOLONE SODIUM SUCC 125 MG IJ SOLR
125.0000 mg | Freq: Once | INTRAMUSCULAR | Status: AC
  Administered 2016-08-14: 125 mg via INTRAMUSCULAR

## 2016-08-14 MED ORDER — TRAMADOL HCL 50 MG PO TABS: 50.0000 mg | ORAL_TABLET | Freq: Three times a day (TID) | ORAL | 0 refills | Status: DC | PRN
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
## 2016-08-15 LAB — TSH: TSH: 1.03 u[IU]/mL (ref 0.450–4.500)

## 2016-08-15 LAB — URINALYSIS, COMPLETE
Bilirubin, UA: NEGATIVE
GLUCOSE, UA: NEGATIVE
KETONES UA: NEGATIVE
Leukocytes, UA: NEGATIVE
NITRITE UA: NEGATIVE
Protein, UA: NEGATIVE
RBC UA: NEGATIVE
SPEC GRAV UA: 1.009 (ref 1.005–1.030)
UUROB: 0.2 mg/dL (ref 0.2–1.0)
pH, UA: 5.5 (ref 5.0–7.5)

## 2016-08-15 LAB — CBC WITH DIFFERENTIAL/PLATELET
BASOS ABS: 0.1 10*3/uL (ref 0.0–0.2)
Basos: 1 %
EOS (ABSOLUTE): 0.7 10*3/uL — AB (ref 0.0–0.4)
Eos: 11 %
Hematocrit: 46.1 % (ref 37.5–51.0)
Hemoglobin: 15.2 g/dL (ref 13.0–17.7)
Immature Grans (Abs): 0 10*3/uL (ref 0.0–0.1)
Immature Granulocytes: 0 %
LYMPHS ABS: 2.4 10*3/uL (ref 0.7–3.1)
Lymphs: 40 %
MCH: 31.5 pg (ref 26.6–33.0)
MCHC: 33 g/dL (ref 31.5–35.7)
MCV: 95 fL (ref 79–97)
Monocytes Absolute: 0.5 10*3/uL (ref 0.1–0.9)
Monocytes: 8 %
NEUTROS ABS: 2.4 10*3/uL (ref 1.4–7.0)
Neutrophils: 40 %
PLATELETS: 260 10*3/uL (ref 150–379)
RBC: 4.83 x10E6/uL (ref 4.14–5.80)
RDW: 14.2 % (ref 12.3–15.4)
WBC: 6 10*3/uL (ref 3.4–10.8)

## 2016-08-15 LAB — LIPID PANEL
CHOLESTEROL TOTAL: 172 mg/dL (ref 100–199)
Chol/HDL Ratio: 4 ratio (ref 0.0–5.0)
HDL: 43 mg/dL (ref >39)
LDL Calculated: 109 mg/dL — ABNORMAL HIGH (ref 0–99)
Triglycerides: 98 mg/dL (ref 0–149)
VLDL CHOLESTEROL CAL: 20 mg/dL (ref 5–40)

## 2016-08-15 LAB — CMP14+EGFR
ALBUMIN: 5 g/dL — AB (ref 3.6–4.8)
ALK PHOS: 76 IU/L (ref 39–117)
ALT: 104 IU/L — AB (ref 0–44)
AST: 63 IU/L — ABNORMAL HIGH (ref 0–40)
Albumin/Globulin Ratio: 2.2 (ref 1.2–2.2)
BILIRUBIN TOTAL: 0.6 mg/dL (ref 0.0–1.2)
BUN / CREAT RATIO: 6 — AB (ref 10–24)
BUN: 6 mg/dL — AB (ref 8–27)
CHLORIDE: 98 mmol/L (ref 96–106)
CO2: 22 mmol/L (ref 20–29)
Calcium: 9.4 mg/dL (ref 8.6–10.2)
Creatinine, Ser: 1.02 mg/dL (ref 0.76–1.27)
GFR calc Af Amer: 92 mL/min/{1.73_m2} (ref >59)
GFR calc non Af Amer: 80 mL/min/{1.73_m2} (ref >59)
GLUCOSE: 61 mg/dL — AB (ref 65–99)
Globulin, Total: 2.3 g/dL (ref 1.5–4.5)
POTASSIUM: 4.1 mmol/L (ref 3.5–5.2)
Sodium: 143 mmol/L (ref 134–144)
Total Protein: 7.3 g/dL (ref 6.0–8.5)

## 2016-08-15 LAB — MICROSCOPIC EXAMINATION: CASTS: NONE SEEN /LPF

## 2016-08-15 LAB — VITAMIN D 25 HYDROXY (VIT D DEFICIENCY, FRACTURES): VIT D 25 HYDROXY: 51.1 ng/mL (ref 30.0–100.0)

## 2016-08-15 MED FILL — hydrOXYzine HCL 25 MG TABS: 25 | 20 days supply | Qty: 60 | Fill #0

## 2016-08-15 MED FILL — !VENTOLIN HFA INHALER: 108 (90 BAS | 25 days supply | Qty: 18 | Fill #0

## 2016-08-15 MED FILL — ?PREDNISONE 20 MG TABLET: 20 | 14 days supply | Qty: 21 | Fill #0

## 2016-08-19 ENCOUNTER — Telehealth: Payer: Self-pay | Admitting: *Deleted

## 2016-08-19 NOTE — Telephone Encounter (Signed)
-----   Message from Quentin Angstlugbemiga E Jegede, MD sent at 08/16/2016  4:32 PM EDT ----- Lab results are mostly normal except slightly elevated liver enzymes, please avoid alcohol intake and tylenol or tylenol containing medications. We will monitor the liver enzymes

## 2016-08-19 NOTE — Telephone Encounter (Signed)
Medical Assistant left message on patient's home and cell voicemail. Voicemail states to give a call back to Nubia with [facility] at [phone].  

## 2017-03-12 ENCOUNTER — Ambulatory Visit: Payer: Self-pay | Attending: Internal Medicine | Admitting: Internal Medicine

## 2017-03-12 DIAGNOSIS — J441 Chronic obstructive pulmonary disease with (acute) exacerbation: Secondary | ICD-10-CM | POA: Insufficient documentation

## 2017-03-12 DIAGNOSIS — Z7951 Long term (current) use of inhaled steroids: Secondary | ICD-10-CM | POA: Insufficient documentation

## 2017-03-12 DIAGNOSIS — F411 Generalized anxiety disorder: Secondary | ICD-10-CM | POA: Insufficient documentation

## 2017-03-12 DIAGNOSIS — Z79899 Other long term (current) drug therapy: Secondary | ICD-10-CM | POA: Insufficient documentation

## 2017-03-12 DIAGNOSIS — Z4802 Encounter for removal of sutures: Secondary | ICD-10-CM | POA: Insufficient documentation

## 2017-03-12 MED ORDER — ALBUTEROL SULFATE HFA 108 (90 BASE) MCG/ACT IN AERS: 1.0000 | INHALATION_SPRAY | Freq: Four times a day (QID) | RESPIRATORY_TRACT | 3 refills | Status: DC | PRN

## 2017-03-12 MED FILL — !VENTOLIN HFA INHALER: 108 (90 BAS | 25 days supply | Qty: 18 | Fill #1

## 2017-03-12 NOTE — Progress Notes (Signed)
Corey Lawrence, is a 62 y.o. male  WUJ:[id number]  [id number]:[id number]  DOB - 04/25/1955  No chief complaint on file.      Subjective:   Corey Lawrence is a 62 y.o. male here today for stitch removal. He was seen in the ED on 03/04/2017 with Rt hand/Wrist Laceration from a piece of glass. He subsequently had 10 stitches and was instructed to have the stiches removed by his PCP, that's why he is here today. He also need refill of his inhalers. He is up to date with his immunizations including dTap. No wound discharge. Wound is healing well. Patient has No headache, No chest pain, No abdominal pain - No Nausea, No new weakness tingling or numbness, No Cough - SOB.  Problem  Visit for Suture Removal    ALLERGIES: No Known Allergies  PAST MEDICAL HISTORY: Past Medical History:  Diagnosis Date  . Ankle fracture, left    "casted; no OR"  . Chronic lower back pain   . COPD (chronic obstructive pulmonary disease) (HCC)   . Tobacco abuse     MEDICATIONS AT HOME: Prior to Admission medications   Medication Sig Start Date End Date Taking? Authorizing Provider  acetaminophen (TYLENOL) 325 MG tablet Take 2 tablets (650 mg total) by mouth every 6 (six) hours as needed for mild pain (or Fever >/= 101). 09/23/13   Christiane HaSullivan, Corinna L, MD  albuterol (PROVENTIL HFA;VENTOLIN HFA) 108 (90 Base) MCG/ACT inhaler Inhale 1-2 puffs into the lungs every 6 (six) hours as needed for wheezing or shortness of breath. 03/12/17   Quentin AngstJegede, Lilliemae Fruge E, MD  albuterol (PROVENTIL) (2.5 MG/3ML) 0.083% nebulizer solution Take 3 mLs (2.5 mg total) by nebulization every 6 (six) hours as needed for wheezing or shortness of breath. 05/07/16   Pricilla LovelessGoldston, Scott, MD  hydrOXYzine (ATARAX/VISTARIL) 25 MG tablet Take 1 tablet (25 mg total) by mouth 3 (three) times daily as needed. 08/14/16   Quentin AngstJegede, Jaye Saal E, MD  mometasone-formoterol (DULERA) 100-5 MCG/ACT AERO Inhale 2 puffs into the lungs daily. 08/14/16   Quentin AngstJegede, Virginie Josten E, MD    Multiple Vitamins-Minerals (MULTIVITAMIN ADULTS 50+) TABS Take 1 tablet by mouth daily.    [provider]  naproxen (NAPROSYN) 500 MG tablet Take 1 tablet (500 mg total) by mouth 2 (two) times daily with a meal. 12/05/14   Tarhonda Hollenberg E, MD  predniSONE (DELTASONE) 20 MG tablet 3 tabs po daily x 2 days, then 2 tabs x 3 days, then 1.5 tabs x 3 days, then 1 tab x 3 days, then 0.5 tabs x 3 days 08/14/16   Quentin AngstJegede, Aaliyana Fredericks E, MD  traMADol (ULTRAM) 50 MG tablet Take 1 tablet (50 mg total) by mouth every 8 (eight) hours as needed for moderate pain. 08/14/16   Quentin AngstJegede, Tiena Manansala E, MD    Objective:  There were no vitals filed for this visit. Exam General appearance : Awake, alert, not in any distress. Speech Clear. Not toxic looking HEENT: Atraumatic and Normocephalic, pupils equally reactive to light and accomodation Neck: Supple, no JVD. No cervical lymphadenopathy.  Chest: Good air entry bilaterally, no added sounds  CVS: S1 S2 regular, no murmurs.  Abdomen: Bowel sounds present, Non tender and not distended with no gaurding, rigidity or rebound. Extremities: B/L Lower Ext shows no edema, both legs are warm to touch Neurology: Awake alert, and oriented X 3, CN II-XII intact, Non focal Skin: No Rash  Data Review Lab Results  Component Value Date   HGBA1C 5.8 05/22/2016  HGBA1C 5.70 02/20/2015   HGBA1C 5.60 12/05/2014    Assessment & Plan   1. Visit for suture removal  - 10 stitches removed from left thumb base - No complications, no bleeding - Continue oral antibiotics  2. COPD exacerbation (HCC)  - albuterol (PROVENTIL HFA;VENTOLIN HFA) 108 (90 Base) MCG/ACT inhaler; Inhale 1-2 puffs into the lungs every 6 (six) hours as needed for wheezing or shortness of breath.  Dispense: 54 g; Refill: 3  3. Generalized anxiety disorder  - Continue current medications  Patient have been counseled extensively about nutrition and exercise. Other issues discussed during this  visit include: low cholesterol diet, weight control and daily exercise  Return in about 6 months (around 09/09/2017) for COPD.  The patient was given clear instructions to go to ER or return to medical center if symptoms don't improve, worsen or new problems develop. The patient verbalized understanding. The patient was told to call to get lab results if they haven't heard anything in the next week.   This note has been created with Education officer, environmental. Any transcriptional errors are unintentional.    Jeanann Lewandowsky, MD, MHA, Maxwell Caul, CPE [facility] [place], [place] [phone]   03/12/2017, 4:37 PM

## 2017-03-12 NOTE — Patient Instructions (Signed)
Chronic Obstructive Pulmonary Disease Exacerbation  Chronic obstructive pulmonary disease (COPD) is a common lung problem. In COPD, the flow of air from the lungs is limited. COPD exacerbations are times that breathing gets worse and you need extra treatment. Without treatment they can be life threatening. If they happen often, your lungs can become more damaged. If your COPD gets worse, your doctor may treat you with:  ? Medicines.  ? Oxygen.  ? Different ways to clear your airway, such as using a mask.    Follow these instructions at home:  ? Do not smoke.  ? Avoid tobacco smoke and other things that bother your lungs.  ? If given, take your antibiotic medicine as told. Finish the medicine even if you start to feel better.  ? Only take medicines as told by your doctor.  ? Drink enough fluids to keep your pee (urine) clear or pale yellow (unless your doctor has told you not to).  ? Use a cool mist machine (vaporizer).  ? If you use oxygen or a machine that turns liquid medicine into a mist (nebulizer), continue to use them as told.  ? Keep up with shots (vaccinations) as told by your doctor.  ? Exercise regularly.  ? Eat healthy foods.  ? Keep all doctor visits as told.  Get help right away if:  ? You are very short of breath and it gets worse.  ? You have trouble talking.  ? You have bad chest pain.  ? You have blood in your spit (sputum).  ? You have a fever.  ? You keep throwing up (vomiting).  ? You feel weak, or you pass out (faint).  ? You feel confused.  ? You keep getting worse.  This information is not intended to replace advice given to you by your health care provider. Make sure you discuss any questions you have with your health care provider.  Document Released: 01/10/2011 Document Revised: 06/29/2015 Document Reviewed: 09/25/2012  Elsevier Interactive Patient Education ? 2017 Elsevier Inc[employer]

## 2017-04-04 ENCOUNTER — Emergency Department (HOSPITAL_COMMUNITY)
Admission: EM | Admit: 2017-04-04 | Discharge: 2017-04-04 | Disposition: A | Discharge status: Home / Self Care | Payer: Self-pay | Attending: Emergency Medicine | Admitting: Emergency Medicine

## 2017-04-04 ENCOUNTER — Emergency Department (HOSPITAL_COMMUNITY): Payer: Self-pay

## 2017-04-04 DIAGNOSIS — J189 Pneumonia, unspecified organism: Secondary | ICD-10-CM | POA: Insufficient documentation

## 2017-04-04 DIAGNOSIS — F17210 Nicotine dependence, cigarettes, uncomplicated: Secondary | ICD-10-CM | POA: Insufficient documentation

## 2017-04-04 DIAGNOSIS — J449 Chronic obstructive pulmonary disease, unspecified: Secondary | ICD-10-CM | POA: Insufficient documentation

## 2017-04-04 DIAGNOSIS — Z79899 Other long term (current) drug therapy: Secondary | ICD-10-CM | POA: Insufficient documentation

## 2017-04-04 LAB — CBC WITH DIFFERENTIAL/PLATELET
Basophils Absolute: 0 10*3/uL (ref 0.0–0.1)
Basophils Relative: 0 %
Eosinophils Absolute: 0.1 10*3/uL (ref 0.0–0.7)
Eosinophils Relative: 1 %
HCT: 43.8 % (ref 39.0–52.0)
Hemoglobin: 15 g/dL (ref 13.0–17.0)
Lymphocytes Relative: 5 %
Lymphs Abs: 0.8 10*3/uL (ref 0.7–4.0)
MCH: 33 pg (ref 26.0–34.0)
MCHC: 34.2 g/dL (ref 30.0–36.0)
MCV: 96.3 fL (ref 78.0–100.0)
Monocytes Absolute: 1 10*3/uL (ref 0.1–1.0)
Monocytes Relative: 6 %
Neutro Abs: 14.3 10*3/uL — ABNORMAL HIGH (ref 1.7–7.7)
Neutrophils Relative %: 88 %
Platelets: 219 10*3/uL (ref 150–400)
RBC: 4.55 MIL/uL (ref 4.22–5.81)
RDW: 13.6 % (ref 11.5–15.5)
WBC: 16.2 10*3/uL — ABNORMAL HIGH (ref 4.0–10.5)

## 2017-04-04 LAB — BASIC METABOLIC PANEL
Anion gap: 11 (ref 5–15)
BUN: 15 mg/dL (ref 6–20)
CO2: 24 mmol/L (ref 22–32)
Calcium: 9 mg/dL (ref 8.9–10.3)
Chloride: 102 mmol/L (ref 101–111)
Creatinine, Ser: 0.99 mg/dL (ref 0.61–1.24)
GFR calc Af Amer: >60 mL/min (ref >60)
GFR calc non Af Amer: >60 mL/min (ref >60)
Glucose, Bld: 103 mg/dL — ABNORMAL HIGH (ref 65–99)
Potassium: 3.4 mmol/L — ABNORMAL LOW (ref 3.5–5.1)
Sodium: 137 mmol/L (ref 135–145)

## 2017-04-04 LAB — URINALYSIS, ROUTINE W REFLEX MICROSCOPIC
Bilirubin Urine: NEGATIVE
Glucose, UA: NEGATIVE mg/dL
Hgb urine dipstick: NEGATIVE
Ketones, ur: NEGATIVE mg/dL
Leukocytes, UA: NEGATIVE
Nitrite: NEGATIVE
Protein, ur: NEGATIVE mg/dL
Specific Gravity, Urine: 1.019 (ref 1.005–1.030)
pH: 7 (ref 5.0–8.0)

## 2017-04-04 MED ORDER — PREDNISONE 20 MG PO TABS
40.0000 mg | ORAL_TABLET | Freq: Once | ORAL | Status: AC
  Administered 2017-04-04: 40 mg via ORAL
  Filled 2017-04-04: qty 2

## 2017-04-04 MED ORDER — IBUPROFEN 200 MG PO TABS
600.0000 mg | ORAL_TABLET | Freq: Once | ORAL | Status: AC
  Administered 2017-04-04: 600 mg via ORAL
  Filled 2017-04-04: qty 3

## 2017-04-04 MED ORDER — IPRATROPIUM-ALBUTEROL 0.5-2.5 (3) MG/3ML IN SOLN
3.0000 mL | Freq: Once | RESPIRATORY_TRACT | Status: AC
  Administered 2017-04-04: 3 mL via RESPIRATORY_TRACT
  Filled 2017-04-04: qty 3

## 2017-04-04 MED ORDER — IOPAMIDOL (ISOVUE-370) INJECTION 76%
INTRAVENOUS | Status: AC
  Filled 2017-04-04: qty 100

## 2017-04-04 MED ORDER — OXYCODONE-ACETAMINOPHEN 5-325 MG PO TABS
1.0000 | ORAL_TABLET | Freq: Once | ORAL | Status: AC
  Administered 2017-04-04: 1 via ORAL
  Filled 2017-04-04: qty 1

## 2017-04-04 MED ORDER — LEVOFLOXACIN 750 MG PO TABS
750.0000 mg | ORAL_TABLET | Freq: Once | ORAL | Status: AC
  Administered 2017-04-04: 750 mg via ORAL
  Filled 2017-04-04: qty 1

## 2017-04-04 MED ORDER — IOPAMIDOL (ISOVUE-370) INJECTION 76%
100.0000 mL | Freq: Once | INTRAVENOUS | Status: AC | PRN
  Administered 2017-04-04: 79 mL via INTRAVENOUS

## 2017-04-04 MED ORDER — LEVOFLOXACIN 500 MG PO TABS: 500.0000 mg | ORAL_TABLET | Freq: Every day | ORAL | 0 refills | Status: DC

## 2017-04-04 MED ORDER — SODIUM CHLORIDE 0.9 % IV BOLUS (SEPSIS)
1000.0000 mL | Freq: Once | INTRAVENOUS | Status: AC
  Administered 2017-04-04: 1000 mL via INTRAVENOUS

## 2017-04-04 MED ORDER — SODIUM CHLORIDE 0.9 % IJ SOLN
INTRAMUSCULAR | Status: AC
  Filled 2017-04-04: qty 50

## 2017-04-04 NOTE — ED Notes (Signed)
Patient transported to CT 

## 2017-04-04 NOTE — ED Provider Notes (Signed)
[facility]-EMERGENCY DEPT Provider Note   CSN: [id number] Arrival date & time: 04/04/17  0865     History   Chief Complaint Chief Complaint  Patient presents with  . Back Pain  . Shortness of Breath    HPI Corey Lawrence is a 62 y.o. male.  HPI   62yM with R mid back pain. Onset last night. Persistent since then. First noticed while at rest. Pain is sharp. Radiates to R axilla. Worse with coughing, movement, deep breaths. Hx of COPD. Not on home o2. Reports acute on chronic dyspnea. Increasing cough. No unusual swelling. No fever.   Past Medical History:  Diagnosis Date  . Ankle fracture, left    "casted; no OR"  . Chronic lower back pain   . COPD (chronic obstructive pulmonary disease) (HCC)   . Tobacco abuse     Patient Active Problem List   Diagnosis Date Noted  . Visit for suture removal 03/12/2017  . Generalized anxiety disorder 05/22/2016  . Left shoulder pain 05/25/2015  . Chronic obstructive pulmonary disease, unspecified copd, unspecified chronic bronchitis type 02/20/2015  . Left wrist pain 12/05/2014  . Wheezing 12/05/2014  . Smoking addiction 12/05/2014  . Prediabetes 09/29/2013  . Chronic low back pain 09/29/2013  . Smoking 09/29/2013  . COPD (chronic obstructive pulmonary disease) (HCC) 09/21/2013  . COPD exacerbation (HCC) 09/21/2013  . Cigarette nicotine dependence with withdrawal 09/21/2013  . Acute respiratory failure with hypoxia (HCC) 09/21/2013  . Chest pressure 09/21/2013    Past Surgical History:  Procedure Laterality Date  . INCISION AND DRAINAGE OF WOUND Left ~ 1977   "splinter got infected between" digits 2 & 3       Home Medications    Prior to Admission medications   Medication Sig Start Date End Date Taking? Authorizing Provider  acetaminophen (TYLENOL) 325 MG tablet Take 2 tablets (650 mg total) by mouth every 6 (six) hours as needed for mild pain (or Fever >/= 101). 09/23/13  Yes Christiane Ha,  MD  albuterol (PROVENTIL HFA;VENTOLIN HFA) 108 (90 Base) MCG/ACT inhaler Inhale 1-2 puffs into the lungs every 6 (six) hours as needed for wheezing or shortness of breath. 03/12/17  Yes Quentin Angst, MD  albuterol (PROVENTIL) (2.5 MG/3ML) 0.083% nebulizer solution Take 3 mLs (2.5 mg total) by nebulization every 6 (six) hours as needed for wheezing or shortness of breath. 05/07/16  Yes Pricilla Loveless, MD  diphenhydramine-acetaminophen (TYLENOL PM) 25-500 MG TABS tablet Take 1 tablet by mouth at bedtime as needed.   Yes [provider]  Multiple Vitamins-Minerals (MULTIVITAMIN ADULTS 50+) TABS Take 1 tablet by mouth daily.   Yes [provider]  hydrOXYzine (ATARAX/VISTARIL) 25 MG tablet Take 1 tablet (25 mg total) by mouth 3 (three) times daily as needed. Patient not taking: Reported on 04/04/2017 08/14/16   Quentin Angst, MD  mometasone-formoterol (DULERA) 100-5 MCG/ACT AERO Inhale 2 puffs into the lungs daily. Patient not taking: Reported on 04/04/2017 08/14/16   Quentin Angst, MD  naproxen (NAPROSYN) 500 MG tablet Take 1 tablet (500 mg total) by mouth 2 (two) times daily with a meal. Patient not taking: Reported on 04/04/2017 12/05/14   Quentin Angst, MD  predniSONE (DELTASONE) 20 MG tablet 3 tabs po daily x 2 days, then 2 tabs x 3 days, then 1.5 tabs x 3 days, then 1 tab x 3 days, then 0.5 tabs x 3 days Patient not taking: Reported on 04/04/2017 08/14/16   Hyman Hopes,  Olugbemiga E, MD  traMADol (ULTRAM) 50 MG tablet Take 1 tablet (50 mg total) by mouth every 8 (eight) hours as needed for moderate pain. Patient not taking: Reported on 04/04/2017 08/14/16   Quentin AngstJegede, Olugbemiga E, MD    Family History Family History  Problem Relation Age of Onset  . Esophageal cancer Father   . Colon cancer Sister   . Cancer - Other Brother   . Heart disease Mother   . Diabetes Mellitus II Mother   . Heart disease Unknown        multiple maternal family members  . Diabetes Mellitus  II Unknown        multiple maternal family members    Social History Social History   Tobacco Use  . Smoking status: Current Every Day Smoker    Packs/day: 0.25    Years: 43.00    Pack years: 10.75    Types: Cigarettes  . Smokeless tobacco: Never Used  . Tobacco comment: currently on patch  Substance Use Topics  . 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 Drug use: Yes    Comment: 09/21/2013 "used marijuana once when I was 20; used cocaine  in ~ 2000; stopped in ~ 2003"     Allergies   Patient has no known allergies.   Review of Systems Review of Systems  All systems reviewed and negative, other than as noted in HPI.  Physical Exam Updated Vital Signs BP 112/67   Pulse (!) 104   Temp 99.6 F (37.6 C) (Oral)   Resp 19   SpO2 95%   Physical Exam  Constitutional: He appears well-developed and well-nourished. No distress.  HENT:  Head: Normocephalic and atraumatic.  Eyes: Conjunctivae are normal. Right eye exhibits no discharge. Left eye exhibits no discharge.  Neck: Neck supple.  Cardiovascular: Normal rate, regular rhythm and normal heart sounds. Exam reveals no gallop and no friction rub.  No murmur heard. Pulmonary/Chest: No respiratory distress. He has wheezes.  Mild tachypnea  Abdominal: Soft. He exhibits no distension. There is no tenderness.  Musculoskeletal: He exhibits no edema or tenderness.  Lower extremities symmetric as compared to each other. No calf tenderness. Negative Homan's. No palpable cords.   Neurological: He is alert.  Skin: Skin is warm and dry.  Psychiatric: He has a normal mood and affect. His behavior is normal. Thought content normal.  Nursing note and vitals reviewed.    ED Treatments / Results  Labs (all labs ordered are listed, but only abnormal results are displayed) Labs Reviewed  CBC WITH DIFFERENTIAL/PLATELET - Abnormal; Notable for the following components:      Result  Value   WBC 16.2 (*)    Neutro Abs 14.3 (*)    All other components within normal limits  BASIC METABOLIC PANEL - Abnormal; Notable for the following components:   Potassium 3.4 (*)    Glucose, Bld 103 (*)    All other components within normal limits  URINALYSIS, ROUTINE W REFLEX MICROSCOPIC    EKG  EKG Interpretation None       Radiology Dg Chest 2 View  Result Date: 04/04/2017 CLINICAL DATA:  Wheezing and shortness of breath with cough EXAM: CHEST  2 VIEW COMPARISON:  May 07, 2016 FINDINGS: There is no edema or consolidation. The heart size and pulmonary vascularity are normal. No adenopathy. There is aortic atherosclerosis. There is degenerative change in the thoracic spine with slight anterior wedging of  midthoracic vertebral bodies, stable. IMPRESSION: Aortic atherosclerosis.  No edema or consolidation. Aortic Atherosclerosis (ICD10-I70.0). Electronically Signed   By: Bretta Bang III M.D.   On: 04/04/2017 07:54   Ct Angio Chest Pe W And/or Wo Contrast  Result Date: 04/04/2017 CLINICAL DATA:  Dyspnea.  COPD. EXAM: CT ANGIOGRAPHY CHEST WITH CONTRAST TECHNIQUE: Multidetector CT imaging of the chest was performed using the standard protocol during bolus administration of intravenous contrast. Multiplanar CT image reconstructions and MIPs were obtained to evaluate the vascular anatomy. CONTRAST:  79mL ISOVUE-370 IOPAMIDOL (ISOVUE-370) INJECTION 76% COMPARISON:  Chest radiograph from earlier today. FINDINGS: Cardiovascular: The study is high quality for the evaluation of pulmonary embolism. There are no filling defects in the central, lobar, segmental or subsegmental pulmonary artery branches to suggest acute pulmonary embolism. Atherosclerotic nonaneurysmal thoracic aorta. Normal caliber pulmonary arteries. Normal heart size. No significant pericardial fluid/thickening. Left main and left anterior descending coronary atherosclerosis. Mediastinum/Nodes: No discrete thyroid nodules.  Unremarkable esophagus. No axillary adenopathy. No hilar adenopathy. Soft tissue density in the subcarinal region is suspected to represent a mildly enlarged 1.3 cm subcarinal node (series 4/image 46). Lungs/Pleura: No pneumothorax. No pleural effusion. Moderate centrilobular emphysema with diffuse bronchial wall thickening. There is patchy consolidation in the dependent basilar right lower lobe and in the medial left lower lobe. A few scattered solid pulmonary nodules in both lungs, largest 8 mm in the right lower lobe (series 10/image 101). Upper abdomen: No acute abnormality. Musculoskeletal: No aggressive appearing focal osseous lesions. Mild thoracic spondylosis. Review of the MIP images confirms the above findings. IMPRESSION: 1. No pulmonary embolism. 2. Patchy consolidation in the basilar right lower lobe and medial left lower lobe, most suggestive of multilobar pneumonia, potentially due to aspiration given the distribution. 3. Scattered solid pulmonary nodules, largest 8 mm in the right lower lobe. Non-contrast chest CT at 3-6 months is recommended. If the nodules are stable at time of repeat CT, then future CT at 18-24 months (from today's scan) is considered optional for low-risk patients, but is recommended for high-risk patients. This recommendation follows the consensus statement: Guidelines for Management of Incidental Pulmonary Nodules Detected on CT Images: From the Fleischner Society 2017; Radiology 2017; 284:228-243. 4. Suspected mild subcarinal lymphadenopathy, which can be reassessed on follow-up chest CT in 3-6 months. 5. Left main and 1 vessel coronary atherosclerosis. Aortic Atherosclerosis (ICD10-I70.0) and Emphysema (ICD10-J43.9). Electronically Signed   By: Delbert Phenix M.D.   On: 04/04/2017 09:39    Procedures Procedures (including critical care time)  Medications Ordered in ED Medications  ipratropium-albuterol (DUONEB) 0.5-2.5 (3) MG/3ML nebulizer solution 3 mL (not  administered)  levofloxacin (LEVAQUIN) tablet 750 mg (not administered)  ibuprofen (ADVIL,MOTRIN) tablet 600 mg (600 mg Oral Given 04/04/17 0754)  oxyCODONE-acetaminophen (PERCOCET/ROXICET) 5-325 MG per tablet 1 tablet (1 tablet Oral Given 04/04/17 0754)  predniSONE (DELTASONE) tablet 40 mg (40 mg Oral Given 04/04/17 0754)     Initial Impression / Assessment and Plan / ED Course  I have reviewed the triage vital signs and the nursing notes.  Pertinent labs & imaging results that were available during my care of the patient were reviewed by me and considered in my medical decision making (see chart for details).     62 year old male with pleuritic right-sided back pain.  Imaging findings correlate to area of his pain.  He appears to be in no acute distress.  O2 saturations mildly low on room air but he has a past history of COPD.  His work  of breathing is not significantly increased.  I feel he is appropriate for outpatient treatment. CT w/o PE. I doubt ACS or other emergent process.   Final Clinical Impressions(s) / ED Diagnoses   Final diagnoses:  Community acquired pneumonia of right lung, unspecified part of lung    ED Discharge Orders    None       Raeford Razor, MD 04/04/17 1410

## 2017-04-04 NOTE — ED Triage Notes (Signed)
Pt brought in from home with c/o back pain and shortness of breath  Pt has hx of slipped disc in his back and he moved a tool box yesterday and has been having pain in his upper/mid back   Pt has hx of COPD and had wheezing in all fields and sats were initially 88% on room air  Pt was given a breathing treatment and sats are up to 99%

## 2017-04-04 NOTE — ED Notes (Signed)
Patient transported to X-ray 

## 2017-04-08 MED FILL — $VENTOLIN HFA 18G INHALER: 108 (90 BAS | 25 days supply | Qty: 18 | Fill #2

## 2017-06-16 ENCOUNTER — Telehealth: Payer: Self-pay | Admitting: Internal Medicine

## 2017-06-16 ENCOUNTER — Other Ambulatory Visit: Payer: Self-pay | Admitting: *Deleted

## 2017-06-16 DIAGNOSIS — J441 Chronic obstructive pulmonary disease with (acute) exacerbation: Secondary | ICD-10-CM

## 2017-06-16 MED ORDER — ALBUTEROL SULFATE HFA 108 (90 BASE) MCG/ACT IN AERS: 1.0000 | INHALATION_SPRAY | Freq: Four times a day (QID) | RESPIRATORY_TRACT | 2 refills | Status: DC | PRN

## 2017-06-16 MED ORDER — ALBUTEROL SULFATE (2.5 MG/3ML) 0.083% IN NEBU
2.5000 mg | INHALATION_SOLUTION | Freq: Four times a day (QID) | RESPIRATORY_TRACT | 1 refills | Status: DC | PRN
Start: 2017-06-16 — End: 2017-06-16

## 2017-06-16 MED ORDER — ALBUTEROL SULFATE (2.5 MG/3ML) 0.083% IN NEBU: 2.5000 mg | INHALATION_SOLUTION | Freq: Four times a day (QID) | RESPIRATORY_TRACT | 1 refills | Status: DC | PRN

## 2017-06-16 MED FILL — $VENTOLIN HFA 18G INHALER: 108 (90 BAS | 25 days supply | Qty: 18 | Fill #0

## 2017-06-16 MED FILL — hydrOXYzine HCL 25 MG TABS: 25 | 20 days supply | Qty: 60 | Fill #1

## 2017-06-16 MED FILL — ALBUTEROL SUL 2.5 MG/3 ML S: (2.5 MG/3ML | 12 days supply | Qty: 150 | Fill #0

## 2017-06-16 NOTE — Telephone Encounter (Signed)
Patient is here in the facility and requested for listed medication to be refilled and sent [facility] pharmacy. Patient has an upcoming appointment to reest care and wanted listed medication refilled for the time between.  albuterol (PROVENTIL HFA;VENTOLIN HFA) 108 (90 Base) MCG/ACT inhaler [[id number]]  albuterol (PROVENTIL) (2.5 MG/3ML) 0.083% nebulizer solution [[id number]]  hydrOXYzine (ATARAX/VISTARIL) 25 MG tablet [[id number]]

## 2017-07-21 ENCOUNTER — Ambulatory Visit: Payer: Self-pay | Attending: Internal Medicine | Admitting: Internal Medicine

## 2017-07-21 ENCOUNTER — Encounter: Payer: Self-pay | Admitting: Internal Medicine

## 2017-07-21 VITALS — BP 95/70 | HR 72 | Temp 97.9°F | Resp 16 | Ht 62.0 in | Wt 123.2 lb

## 2017-07-21 DIAGNOSIS — R634 Abnormal weight loss: Secondary | ICD-10-CM

## 2017-07-21 DIAGNOSIS — Z1159 Encounter for screening for other viral diseases: Secondary | ICD-10-CM

## 2017-07-21 DIAGNOSIS — F17200 Nicotine dependence, unspecified, uncomplicated: Secondary | ICD-10-CM

## 2017-07-21 DIAGNOSIS — R768 Other specified abnormal immunological findings in serum: Secondary | ICD-10-CM

## 2017-07-21 DIAGNOSIS — R7689 Other specified abnormal immunological findings in serum: Secondary | ICD-10-CM

## 2017-07-21 DIAGNOSIS — J441 Chronic obstructive pulmonary disease with (acute) exacerbation: Secondary | ICD-10-CM

## 2017-07-21 DIAGNOSIS — F17210 Nicotine dependence, cigarettes, uncomplicated: Secondary | ICD-10-CM | POA: Insufficient documentation

## 2017-07-21 DIAGNOSIS — M545 Low back pain: Secondary | ICD-10-CM | POA: Insufficient documentation

## 2017-07-21 DIAGNOSIS — R918 Other nonspecific abnormal finding of lung field: Secondary | ICD-10-CM

## 2017-07-21 DIAGNOSIS — Z6822 Body mass index (BMI) 22.0-22.9, adult: Secondary | ICD-10-CM | POA: Insufficient documentation

## 2017-07-21 DIAGNOSIS — Z131 Encounter for screening for diabetes mellitus: Secondary | ICD-10-CM

## 2017-07-21 DIAGNOSIS — J449 Chronic obstructive pulmonary disease, unspecified: Secondary | ICD-10-CM | POA: Insufficient documentation

## 2017-07-21 DIAGNOSIS — F419 Anxiety disorder, unspecified: Secondary | ICD-10-CM | POA: Insufficient documentation

## 2017-07-21 MED ORDER — MOMETASONE FURO-FORMOTEROL FUM 100-5 MCG/ACT IN AERO: 2.0000 | INHALATION_SPRAY | Freq: Every day | RESPIRATORY_TRACT | 6 refills | Status: DC

## 2017-07-21 MED ORDER — ALBUTEROL SULFATE HFA 108 (90 BASE) MCG/ACT IN AERS
1.0000 | INHALATION_SPRAY | Freq: Four times a day (QID) | RESPIRATORY_TRACT | 6 refills | Status: DC | PRN
Start: 2017-07-21 — End: 2018-01-20

## 2017-07-21 MED ORDER — TIOTROPIUM BROMIDE MONOHYDRATE 18 MCG IN CAPS: 18.0000 ug | ORAL_CAPSULE | Freq: Every day | RESPIRATORY_TRACT | 12 refills | Status: DC

## 2017-07-21 MED ORDER — PREDNISONE 20 MG PO TABS: ORAL_TABLET | ORAL | 0 refills | Status: DC

## 2017-07-21 MED ORDER — AZITHROMYCIN 250 MG PO TABS: ORAL_TABLET | ORAL | 0 refills | Status: DC

## 2017-07-21 MED ORDER — SODIUM CHLORIDE 0.9 % IV SOLN: 125.0000 mg | Freq: Once | INTRAVENOUS | Status: DC

## 2017-07-21 MED ORDER — ALBUTEROL SULFATE (2.5 MG/3ML) 0.083% IN NEBU: 2.5000 mg | INHALATION_SOLUTION | Freq: Four times a day (QID) | RESPIRATORY_TRACT | 6 refills | Status: DC | PRN

## 2017-07-21 MED FILL — predniSONE 20 MG TABS: 20 | 7 days supply | Qty: 8 | Fill #0

## 2017-07-21 MED FILL — $VENTOLIN HFA 18G INHALER: 108 (90 BAS | 25 days supply | Qty: 18 | Fill #3

## 2017-07-21 MED FILL — ALBUTEROL 0.083% INHAL SOLN: (2.5 MG/3ML | 15 days supply | Qty: 180 | Fill #0

## 2017-07-21 MED FILL — AZITHROMYCIN 250 MG TABLET: 250 | 5 days supply | Qty: 6 | Fill #0

## 2017-07-21 MED FILL — **DULERA 100 MCG/5 MCG INHA: 100-5 MCG | 30 days supply | Qty: 1 | Fill #0

## 2017-07-21 NOTE — Progress Notes (Signed)
Patient ID: Corey Lawrence, male    DOB: 08/19/1955  MRN: [id number]  CC: re-establish   Subjective: Corey Lawrence is a 62 y.o. male who presents for chronic ds management and to establish with me as PCP.  Previous PCP was Dr. Hyman HopesJegede A. His concerns today include:  COPD, tob dep, chronic LBP and anxiety disorder.   COPD: Very limited in his ability to do anything.  He has applied to his applying for disability.  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  Quit work in 2013.  Took care of his mom for 2 yrs after she had a stroke Can not walk more than 50 feet without stopping, can not pick up any thing over 5 lbs and walk with it. Has to sit on shower bench to take a shower. Treated for pneumonia in March of this year through the ER.  Had CTA of the chest that revealed incidental finding of scattered solid pulmonary nodules largest of which was 8 mm in the right lower lobe. Uses Dulera BID and Albuterol about 20 puffs a day. Uses Nebulizer BID provided he does not run out of Albuterol inhaler Feels he needs abx and Prednisone shot today due to increased wheezing and cough with sputum production. Unfortunately he continues to smoke.  1 pk last 1 wk down from 2 pks a day  Patient Active Problem List   Diagnosis Date Noted  . Visit for suture removal 03/12/2017  . Generalized anxiety disorder 05/22/2016  . Left shoulder pain 05/25/2015  . Chronic obstructive pulmonary disease, unspecified copd, unspecified chronic bronchitis type 02/20/2015  . Left wrist pain 12/05/2014  . Wheezing 12/05/2014  . Smoking addiction 12/05/2014  . Prediabetes 09/29/2013  . Chronic low back pain 09/29/2013  . Smoking 09/29/2013  . COPD (chronic obstructive pulmonary disease) (HCC) 09/21/2013  . COPD exacerbation (HCC) 09/21/2013  . Cigarette nicotine dependence with withdrawal 09/21/2013  . Acute respiratory failure with hypoxia (HCC) 09/21/2013  . Chest pressure 09/21/2013      Current Outpatient Medications on File Prior to Visit  Medication Sig Dispense Refill  . acetaminophen (TYLENOL) 325 MG tablet Take 2 tablets (650 mg total) by mouth every 6 (six) hours as needed for mild pain (or Fever >/= 101).    . diphenhydramine-acetaminophen (TYLENOL PM) 25-500 MG TABS tablet Take 1 tablet by mouth at bedtime as needed.    . Multiple Vitamins-Minerals (MULTIVITAMIN ADULTS 50+) TABS Take 1 tablet by mouth daily.     No current facility-administered medications on file prior to visit.     No Known Allergies  Social History   Socioeconomic History  . Marital status: Divorced    Spouse name: Not on file  . Number of children: Not on file  . Years of education: Not on file  . Highest education level: Not on file  Occupational History  . Not on file  Social Needs  . Financial resource strain: Not on file  . Food insecurity:    Worry: Not on file    Inability: Not on file  . Transportation needs:    Medical: Not on file    Non-medical: Not on file  Tobacco Use  . Smoking status: Current Every Day Smoker    Packs/day: 0.25    Years: 43.00    Pack years: 10.75    Types: Cigarettes  . Smokeless tobacco: Never Used  . Tobacco comment: currently on patch  Substance and Sexual Activity  . 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 Drug use: Yes    Comment: 09/21/2013 "used marijuana once when I was 20; used cocaine  in ~ 2000; stopped in ~ 2003"  . Sexual activity: Not Currently  Lifestyle  . Physical activity:    Days per week: Not on file    Minutes per session: Not on file  . Stress: Not on file  Relationships  . Social connections:    Talks on phone: Not on file    Gets together: Not on file    Attends religious service: Not on file    Active member of club or organization: Not on file    Attends meetings of clubs or organizations: Not on file    Relationship status: Not on file  . Intimate partner  violence:    Fear of current or ex partner: Not on file    Emotionally abused: Not on file    Physically abused: Not on file    Forced sexual activity: Not on file  Other Topics Concern  . Not on file  Social History Narrative   Lives in trailer.  Not married.  5 children.      Family History  Problem Relation Age of Onset  . Esophageal cancer Father   . Colon cancer Sister   . Cancer - Other Brother   . Heart disease Mother   . Diabetes Mellitus II Mother   . Heart disease Unknown        multiple maternal family members  . Diabetes Mellitus II Unknown        multiple maternal family members    Past Surgical History:  Procedure Laterality Date  . INCISION AND DRAINAGE OF WOUND Left ~ 1977   "splinter got infected between" digits 2 & 3    ROS: Review of Systems Negative except as stated above PHYSICAL EXAM: BP 95/70   Pulse 72   Temp 97.9 F (36.6 C) (Oral)   Resp 16   Ht 5\' 2"  (1.575 m)   Wt 123 lb 3.2 oz (55.9 kg)   SpO2 95%   BMI 22.53 kg/m   Wt Readings from Last 3 Encounters:  07/21/17 123 lb 3.2 oz (55.9 kg)  08/14/16 131 lb (59.4 kg)  05/22/16 133 lb (60.3 kg)    Physical Exam  General appearance -elderly Caucasian male in NAD  mental status - normal mood, behavior, speech, dress, motor activity, and thought processes Mouth - mucous membranes moist, pharynx normal without lesions Neck - supple, no significant adenopathy Chest -breath sounds decreased bilaterally with mild diffuse wheezing.  Able to speak in complete sentences Heart - normal rate, regular rhythm, normal S1, S2, no murmurs, rubs, clicks or gallops Extremities - no LE edema   ASSESSMENT AND PLAN: 1. COPD exacerbation (HCC) We will give a course of prednisone and Zithromax. He would benefit from pulmonary rehab.  Encouraged to apply for the orange card/cone discount card. Smoking cessation strongly encouraged - tiotropium (SPIRIVA HANDIHALER) 18 MCG inhalation capsule; Place 1  capsule (18 mcg total) into inhaler and inhale daily.  Dispense: 60 capsule; Refill: 12 - mometasone-formoterol (DULERA) 100-5 MCG/ACT AERO; Inhale 2 puffs into the lungs daily.  Dispense: 2 Inhaler; Refill: 6 - albuterol (PROVENTIL HFA;VENTOLIN HFA) 108 (90 Base) MCG/ACT inhaler; Inhale 1-2 puffs into the lungs every 6 (six) hours as needed for wheezing or shortness of breath.  Dispense: 2 Inhaler; Refill: 6 - predniSONE (DELTASONE)  20 MG tablet; 2 tabs po daily x 2 days, then 1 tabs x 2 days, then 1/2 tabs x 3 days,  Dispense: 8 tablet; Refill: 0 - albuterol (PROVENTIL) (2.5 MG/3ML) 0.083% nebulizer solution; Take 3 mLs (2.5 mg total) by nebulization every 6 (six) hours as needed for wheezing or shortness of breath.  Dispense: 150 mL; Refill: 6 - azithromycin (ZITHROMAX) 250 MG tablet; 2 tabs PO x 1 then 1 tab PO daily  Dispense: 6 tablet; Refill: 0 - methylPREDNISolone sodium succinate (SOLU-MEDROL) 130 mg in sodium chloride 0.9 % 50 mL IVPB - methylPREDNISolone sodium succinate (SOLU-MEDROL) 125 mg/2 mL injection 125 mg  2. Weight loss, unintentional Noted on review of weight from past several visits.  May be related to severe COPD.  Also needs follow-up of pulmonary nodules - CBC; Future - Comprehensive metabolic panel; Future - Lipid panel; Future - TSH; Future - HIV antibody; Future  3. Tobacco dependence See #1 above.  4. Lung nodules Patient will apply for the orange card/cone discount.  I will see him back in 1 month at which time we will order low-dose CT scan of chest for follow-up  5. Need for hepatitis C screening test - Hepatitis C Antibody   Patient was given the opportunity to ask questions.  Patient verbalized understanding of the plan and was able to repeat key elements of the plan.   Orders Placed This Encounter  Procedures  . CBC  . Comprehensive metabolic panel  . Lipid panel  . TSH  . HIV antibody  . Hepatitis C Antibody  . T4, Free  . T3, Free  .  Hemoglobin A1c  . Hepatitis c vrs RNA detect by PCR-qual     Requested Prescriptions   Signed Prescriptions Disp Refills  . tiotropium (SPIRIVA HANDIHALER) 18 MCG inhalation capsule 60 capsule 12    Sig: Place 1 capsule (18 mcg total) into inhaler and inhale daily.  . mometasone-formoterol (DULERA) 100-5 MCG/ACT AERO 2 Inhaler 6    Sig: Inhale 2 puffs into the lungs daily.  Marland Kitchen albuterol (PROVENTIL HFA;VENTOLIN HFA) 108 (90 Base) MCG/ACT inhaler 2 Inhaler 6    Sig: Inhale 1-2 puffs into the lungs every 6 (six) hours as needed for wheezing or shortness of breath.  . predniSONE (DELTASONE) 20 MG tablet 8 tablet 0    Sig: 2 tabs po daily x 2 days, then 1 tabs x 2 days, then 1/2 tabs x 3 days,  . albuterol (PROVENTIL) (2.5 MG/3ML) 0.083% nebulizer solution 150 mL 6    Sig: Take 3 mLs (2.5 mg total) by nebulization every 6 (six) hours as needed for wheezing or shortness of breath.  Marland Kitchen azithromycin (ZITHROMAX) 250 MG tablet 6 tablet 0    Sig: 2 tabs PO x 1 then 1 tab PO daily    Return in about 6 weeks (around 09/01/2017).  Jonah Blue, MD, FACP

## 2017-07-22 ENCOUNTER — Ambulatory Visit: Payer: Self-pay | Attending: Internal Medicine

## 2017-07-22 ENCOUNTER — Other Ambulatory Visit: Payer: Self-pay | Admitting: Pharmacist

## 2017-07-22 DIAGNOSIS — R634 Abnormal weight loss: Secondary | ICD-10-CM | POA: Insufficient documentation

## 2017-07-22 MED ORDER — METHYLPREDNISOLONE SODIUM SUCC 125 MG IJ SOLR: 125.0000 mg | Freq: Once | INTRAMUSCULAR | Status: DC

## 2017-07-22 MED ORDER — UMECLIDINIUM BROMIDE 62.5 MCG/INH IN AEPB: 1.0000 | INHALATION_SPRAY | Freq: Every day | RESPIRATORY_TRACT | 12 refills | Status: DC

## 2017-07-22 MED FILL — !INCRUSE ELLIPTA 62.5 MCG I: 62.5 | 30 days supply | Qty: 30 | Fill #0

## 2017-07-22 NOTE — Progress Notes (Signed)
inc

## 2017-07-22 NOTE — Progress Notes (Signed)
Pt here for lab visit only 

## 2017-07-23 LAB — HIV ANTIBODY (ROUTINE TESTING W REFLEX): HIV SCREEN 4TH GENERATION: NONREACTIVE

## 2017-07-23 LAB — LIPID PANEL
CHOL/HDL RATIO: 4.1 ratio (ref 0.0–5.0)
Cholesterol, Total: 153 mg/dL (ref 100–199)
HDL: 37 mg/dL — ABNORMAL LOW (ref >39)
LDL CALC: 101 mg/dL — AB (ref 0–99)
TRIGLYCERIDES: 76 mg/dL (ref 0–149)
VLDL Cholesterol Cal: 15 mg/dL (ref 5–40)

## 2017-07-23 LAB — COMPREHENSIVE METABOLIC PANEL
A/G RATIO: 1.5 (ref 1.2–2.2)
ALT: 97 IU/L — AB (ref 0–44)
AST: 61 IU/L — AB (ref 0–40)
Albumin: 4.3 g/dL (ref 3.6–4.8)
Alkaline Phosphatase: 114 IU/L (ref 39–117)
BUN/Creatinine Ratio: 14 (ref 10–24)
BUN: 12 mg/dL (ref 8–27)
Bilirubin Total: 0.3 mg/dL (ref 0.0–1.2)
CALCIUM: 10.1 mg/dL (ref 8.6–10.2)
CHLORIDE: 101 mmol/L (ref 96–106)
CO2: 23 mmol/L (ref 20–29)
Creatinine, Ser: 0.84 mg/dL (ref 0.76–1.27)
GFR calc Af Amer: 109 mL/min/{1.73_m2} (ref >59)
GFR calc non Af Amer: 95 mL/min/{1.73_m2} (ref >59)
GLUCOSE: 246 mg/dL — AB (ref 65–99)
Globulin, Total: 2.9 g/dL (ref 1.5–4.5)
POTASSIUM: 4.4 mmol/L (ref 3.5–5.2)
Sodium: 141 mmol/L (ref 134–144)
Total Protein: 7.2 g/dL (ref 6.0–8.5)

## 2017-07-23 LAB — TSH: TSH: 0.227 u[IU]/mL — ABNORMAL LOW (ref 0.450–4.500)

## 2017-07-23 LAB — CBC
HEMATOCRIT: 43.4 % (ref 37.5–51.0)
HEMOGLOBIN: 14.7 g/dL (ref 13.0–17.7)
MCH: 31.8 pg (ref 26.6–33.0)
MCHC: 33.9 g/dL (ref 31.5–35.7)
MCV: 94 fL (ref 79–97)
Platelets: 252 10*3/uL (ref 150–450)
RBC: 4.62 x10E6/uL (ref 4.14–5.80)
RDW: 14 % (ref 12.3–15.4)
WBC: 5.8 10*3/uL (ref 3.4–10.8)

## 2017-07-24 LAB — SPECIMEN STATUS REPORT

## 2017-07-24 LAB — HEPATITIS C ANTIBODY

## 2017-07-30 ENCOUNTER — Ambulatory Visit: Payer: Self-pay | Attending: Internal Medicine

## 2017-07-30 ENCOUNTER — Telehealth: Payer: Self-pay

## 2017-07-30 ENCOUNTER — Ambulatory Visit: Payer: Self-pay

## 2017-07-30 MED FILL — $VENTOLIN HFA 18G INHALER: 108 (90 BAS | 50 days supply | Qty: 36 | Fill #0

## 2017-07-30 NOTE — Telephone Encounter (Signed)
Contacted pt to go over lab results pt is aware and will be coming in today to have lab work done

## 2017-07-31 ENCOUNTER — Telehealth: Payer: Self-pay

## 2017-07-31 LAB — T3, FREE
T3 FREE: 2.5 pg/mL (ref 2.0–4.4)
T3, Free: 3 pg/mL (ref 2.0–4.4)

## 2017-07-31 LAB — HEMOGLOBIN A1C
Est. average glucose Bld gHb Est-mCnc: 103 mg/dL
Hgb A1c MFr Bld: 5.2 % (ref 4.8–5.6)

## 2017-07-31 LAB — T4, FREE
FREE T4: 1.21 ng/dL (ref 0.82–1.77)
Free T4: 1.23 ng/dL (ref 0.82–1.77)

## 2017-07-31 LAB — SPECIMEN STATUS REPORT

## 2017-07-31 NOTE — Telephone Encounter (Signed)
Contacted pt to go over lab results pt is aware and doesn't have any questions or concerns  I informed pt once we gets the hep c results back we will give him a call

## 2017-08-01 LAB — HEPATITIS C VRS RNA DETECT BY PCR-QUAL: HCV RNA NAA Qualitative: POSITIVE — AB

## 2017-08-05 ENCOUNTER — Telehealth: Payer: Self-pay

## 2017-08-05 NOTE — Telephone Encounter (Signed)
Contacted pt to go over lab results pt didn't answer lvm asking pt to give me a call at his earliest convenience  

## 2017-08-06 ENCOUNTER — Telehealth: Payer: Self-pay | Admitting: Internal Medicine

## 2017-08-06 NOTE — Telephone Encounter (Signed)
Returned pt call and went over lab results pt is aware and doesn't have any questions or concerns  

## 2017-08-06 NOTE — Telephone Encounter (Signed)
Patient called the CMA back and he would like a call back at [phone]

## 2017-08-25 ENCOUNTER — Ambulatory Visit: Payer: Self-pay | Attending: Internal Medicine

## 2017-08-25 MED FILL — ?ALBUTEROL SUL 2.5 MG/3 MLS: (2.5 MG/3ML | 15 days supply | Qty: 180 | Fill #1

## 2017-08-25 MED FILL — !INCRUSE ELLIPTA 62.5 MCG I: 62.5 | 30 days supply | Qty: 30 | Fill #1

## 2017-08-25 MED FILL — **DULERA 100 MCG/5 MCG INHA: 100-5 MCG | 30 days supply | Qty: 1 | Fill #1

## 2017-08-27 ENCOUNTER — Ambulatory Visit: Payer: Self-pay | Admitting: Critical Care Medicine

## 2017-09-11 ENCOUNTER — Ambulatory Visit: Payer: Self-pay | Attending: Internal Medicine | Admitting: Internal Medicine

## 2017-09-11 ENCOUNTER — Encounter: Payer: Self-pay | Admitting: Internal Medicine

## 2017-09-11 VITALS — BP 155/88 | HR 67 | Temp 97.5°F | Resp 16 | Wt 129.2 lb

## 2017-09-11 DIAGNOSIS — Z79899 Other long term (current) drug therapy: Secondary | ICD-10-CM | POA: Insufficient documentation

## 2017-09-11 DIAGNOSIS — M25572 Pain in left ankle and joints of left foot: Secondary | ICD-10-CM | POA: Insufficient documentation

## 2017-09-11 DIAGNOSIS — Z800 Family history of malignant neoplasm of digestive organs: Secondary | ICD-10-CM | POA: Insufficient documentation

## 2017-09-11 DIAGNOSIS — F411 Generalized anxiety disorder: Secondary | ICD-10-CM | POA: Insufficient documentation

## 2017-09-11 DIAGNOSIS — J441 Chronic obstructive pulmonary disease with (acute) exacerbation: Secondary | ICD-10-CM | POA: Insufficient documentation

## 2017-09-11 DIAGNOSIS — F17200 Nicotine dependence, unspecified, uncomplicated: Secondary | ICD-10-CM

## 2017-09-11 DIAGNOSIS — Z833 Family history of diabetes mellitus: Secondary | ICD-10-CM | POA: Insufficient documentation

## 2017-09-11 DIAGNOSIS — R918 Other nonspecific abnormal finding of lung field: Secondary | ICD-10-CM | POA: Insufficient documentation

## 2017-09-11 DIAGNOSIS — B182 Chronic viral hepatitis C: Secondary | ICD-10-CM | POA: Insufficient documentation

## 2017-09-11 DIAGNOSIS — Z8249 Family history of ischemic heart disease and other diseases of the circulatory system: Secondary | ICD-10-CM | POA: Insufficient documentation

## 2017-09-11 DIAGNOSIS — J9601 Acute respiratory failure with hypoxia: Secondary | ICD-10-CM | POA: Insufficient documentation

## 2017-09-11 DIAGNOSIS — F17210 Nicotine dependence, cigarettes, uncomplicated: Secondary | ICD-10-CM | POA: Insufficient documentation

## 2017-09-11 MED ORDER — TRAMADOL HCL 50 MG PO TABS: 50.0000 mg | ORAL_TABLET | Freq: Two times a day (BID) | ORAL | 0 refills | Status: DC | PRN

## 2017-09-11 MED ORDER — PREDNISONE 20 MG PO TABS: ORAL_TABLET | ORAL | 0 refills | Status: DC

## 2017-09-11 MED FILL — $VENTOLIN HFA 18G INHALER: 108 (90 BAS | 50 days supply | Qty: 36 | Fill #1

## 2017-09-11 MED FILL — !INCRUSE ELLIPTA 62.5 MCG I: 62.5 | 30 days supply | Qty: 30 | Fill #2

## 2017-09-11 MED FILL — ?ALBUTEROL SUL 2.5 MG/3 MLS: (2.5 MG/3ML | 15 days supply | Qty: 180 | Fill #2

## 2017-09-11 MED FILL — !DULERA 100 MCG/5 MCG INH: 100-5 | 30 days supply | Qty: 13 | Fill #2

## 2017-09-11 NOTE — Progress Notes (Signed)
Patient ID: Corey Lawrence, male    DOB: 09-07-1955  MRN: [id number]  CC: Follow-up (6 week)   Subjective: Corey Lawrence is a 62 y.o. male who presents for chronic ds management His concerns today include:  COPD, tob dep, chronic LBP and anxiety disorder.  Hep C: Blood test done on last visit confirms that he has hepatitis C.  Denies any street drug use or previous blood transfusions.  He did get a tattoo back in 1983 while in prison.  He is in a monogamous relationship.  He stopped drinking 25 years ago. -His wife had blood test done for hep C but has not received the results as yet. -He reports having hepatitis as a child but does not recall whether it was type a or B  COPD: Treated for pneumonia in March of this year through the ER.  Had CTA of the chest that revealed incidental finding of scattered solid pulmonary nodules largest of which was 8 mm in the right lower lobe. Uses Dulera BID.  He has had decreased use of albuterol since being on Spiriva.  He is still smoking.  1 pack lasts him a week down from 2 packs a day.  Complains of pain about the left ankle x2 weeks.  He sprained the ankle about 2 weeks ago when he accidentally stepped in to a ditch.  The ankle was sore initially but is better.  He feels pain in the area above the ankle when he steps down . He had broken the left lower leg about 4 years ago and was casted.  He feels the pain that he experiences when he steps down is in the area of previous fracture.  He denies any swelling.  He is requesting some tramadol to use as needed.   Patient Active Problem List   Diagnosis Date Noted  . Visit for suture removal 03/12/2017  . Generalized anxiety disorder 05/22/2016  . Left shoulder pain 05/25/2015  . Chronic obstructive pulmonary disease, unspecified copd, unspecified chronic bronchitis type 02/20/2015  . Left wrist pain 12/05/2014  . Wheezing 12/05/2014  . Smoking addiction 12/05/2014  . Prediabetes 09/29/2013  .  Chronic low back pain 09/29/2013  . Smoking 09/29/2013  . COPD (chronic obstructive pulmonary disease) (HCC) 09/21/2013  . COPD exacerbation (HCC) 09/21/2013  . Cigarette nicotine dependence with withdrawal 09/21/2013  . Acute respiratory failure with hypoxia (HCC) 09/21/2013  . Chest pressure 09/21/2013     Current Outpatient Medications on File Prior to Visit  Medication Sig Dispense Refill  . acetaminophen (TYLENOL) 325 MG tablet Take 2 tablets (650 mg total) by mouth every 6 (six) hours as needed for mild pain (or Fever >/= 101).    Marland Kitchen albuterol (PROVENTIL HFA;VENTOLIN HFA) 108 (90 Base) MCG/ACT inhaler Inhale 1-2 puffs into the lungs every 6 (six) hours as needed for wheezing or shortness of breath. 2 Inhaler 6  . albuterol (PROVENTIL) (2.5 MG/3ML) 0.083% nebulizer solution Take 3 mLs (2.5 mg total) by nebulization every 6 (six) hours as needed for wheezing or shortness of breath. 150 mL 6  . diphenhydramine-acetaminophen (TYLENOL PM) 25-500 MG TABS tablet Take 1 tablet by mouth at bedtime as needed.    . mometasone-formoterol (DULERA) 100-5 MCG/ACT AERO Inhale 2 puffs into the lungs daily. 2 Inhaler 6  . Multiple Vitamins-Minerals (MULTIVITAMIN ADULTS 50+) TABS Take 1 tablet by mouth daily.    Marland Kitchen tiotropium (SPIRIVA HANDIHALER) 18 MCG inhalation capsule Place 1 capsule (18 mcg total) into inhaler and  inhale daily. 60 capsule 12  . umeclidinium bromide (INCRUSE ELLIPTA) 62.5 MCG/INH AEPB Inhale 1 puff into the lungs daily. 1 each 12   No current facility-administered medications on file prior to visit.     No Known Allergies  Social History   Socioeconomic History  . Marital status: Divorced    Spouse name: Not on file  . Number of children: Not on file  . Years of education: Not on file  . Highest education level: Not on file  Occupational History  . Not on file  Social Needs  . Financial resource strain: Not on file  . Food insecurity:    Worry: Not on file    Inability:  Not on file  . Transportation needs:    Medical: Not on file    Non-medical: Not on file  Tobacco Use  . Smoking status: Current Every Day Smoker    Packs/day: 0.25    Years: 43.00    Pack years: 10.75    Types: Cigarettes  . Smokeless tobacco: Never Used  . Tobacco comment: currently on patch  Substance and Sexual Activity  . 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 Drug use: Yes    Comment: 09/21/2013 "used marijuana once when I was 20; used cocaine  in ~ 2000; stopped in ~ 2003"  . Sexual activity: Not Currently  Lifestyle  . Physical activity:    Days per week: Not on file    Minutes per session: Not on file  . Stress: Not on file  Relationships  . Social connections:    Talks on phone: Not on file    Gets together: Not on file    Attends religious service: Not on file    Active member of club or organization: Not on file    Attends meetings of clubs or organizations: Not on file    Relationship status: Not on file  . Intimate partner violence:    Fear of current or ex partner: Not on file    Emotionally abused: Not on file    Physically abused: Not on file    Forced sexual activity: Not on file  Other Topics Concern  . Not on file  Social History Narrative   Lives in trailer.  Not married.  5 children.      Family History  Problem Relation Age of Onset  . Esophageal cancer Father   . Colon cancer Sister   . Cancer - Other Brother   . Heart disease Mother   . Diabetes Mellitus II Mother   . Heart disease Unknown        multiple maternal family members  . Diabetes Mellitus II Unknown        multiple maternal family members    Past Surgical History:  Procedure Laterality Date  . INCISION AND DRAINAGE OF WOUND Left ~ 1977   "splinter got infected between" digits 2 & 3    ROS: Review of Systems Negative except as stated above.    PHYSICAL EXAM: BP (!) 155/88   Pulse 67   Temp (!) 97.5 F  (36.4 C) (Oral)   Resp 16   Wt 129 lb 3.2 oz (58.6 kg)   SpO2 96%   BMI 23.63 kg/m   Wt Readings from Last 3 Encounters:  09/11/17 129 lb 3.2 oz (58.6 kg)  07/21/17 123 lb 3.2 oz (55.9 kg)  08/14/16 131 lb (59.4 kg)  Physical Exam  General appearance - alert, well appearing, and in no distress Mental status - normal mood, behavior, speech, dress, motor activity, and thought processes Neck - supple, no significant adenopathy Chest -mild diffuse wheezing with decreased breath sounds.   Heart - normal rate, regular rhythm, normal S1, S2, no murmurs, rubs, clicks or gallops Musculoskeletal -no edema or erythema noted in the left ankle or the left lower leg.   Extremities - peripheral pulses normal, no pedal edema, no clubbing or cyanosis  ASSESSMENT AND PLAN: 1. COPD exacerbation (HCC) He is wheezing today on exam.  Will give a short course of prednisone.  Continue Spiriva and Dulera - predniSONE (DELTASONE) 20 MG tablet; 2 tabs po daily x 2 days, then 1 tabs x 2 days, then 1/2 tabs x 3 days,  Dispense: 8 tablet; Refill: 0 - Ambulatory referral to Pulmonology  2. Lung nodules -1 of the nodules that was seen on angiogram earlier this year was 8 mm.  Patient is high risk given years of smoking.  May need PET scan or biopsy if increase in size.  Will refer to pulmonary - CT CHEST LUNG CA SCREEN LOW DOSE W/O CM; Future - Ambulatory referral to Pulmonology  3. Tobacco dependence Advised to quit.  He has cut back.  Declines patches.  4. Hep C w/o coma, chronic (HCC) Advised patient to avoid sharing toothbrushes and shavers.  His wife has recently been tested but has not received results as yet.  Informed patient that the risks of transmission to a noninfected partner in a monogamous relationship was less than 1% -We will check to see if he is immune to hepatitis A and B and if not we will give vaccine on follow-up visit -Refer to ID - Hepatitis B surface antigen; Future - Hepatitis  B surface antibody,qualitative; Future - Hepatitis B e antibody; Future - Hepatitis A antibody, total; Future  5. Acute left ankle pain -Given a limited supply of tramadol to use as needed  Patient was given the opportunity to ask questions.  Patient verbalized understanding of the plan and was able to repeat key elements of the plan.   Orders Placed This Encounter  Procedures  . CT CHEST LUNG CA SCREEN LOW DOSE W/O CM  . Hepatitis B surface antigen  . Hepatitis B surface antibody,qualitative  . Hepatitis B e antibody  . Hepatitis A antibody, total  . Ambulatory referral to Pulmonology     Requested Prescriptions   Signed Prescriptions Disp Refills  . predniSONE (DELTASONE) 20 MG tablet 8 tablet 0    Sig: 2 tabs po daily x 2 days, then 1 tabs x 2 days, then 1/2 tabs x 3 days,  . traMADol (ULTRAM) 50 MG tablet 15 tablet 0    Sig: Take 1 tablet (50 mg total) by mouth every 12 (twelve) hours as needed.    Return in about 2 months (around 11/11/2017).  Jonah Blue, MD, FACP

## 2017-09-11 NOTE — Patient Instructions (Signed)
You will be referred to pulmonary and infectious disease specialist.

## 2017-09-12 ENCOUNTER — Telehealth: Payer: Self-pay | Admitting: Internal Medicine

## 2017-09-12 DIAGNOSIS — B182 Chronic viral hepatitis C: Secondary | ICD-10-CM | POA: Insufficient documentation

## 2017-09-12 DIAGNOSIS — F17200 Nicotine dependence, unspecified, uncomplicated: Secondary | ICD-10-CM | POA: Insufficient documentation

## 2017-09-12 DIAGNOSIS — R918 Other nonspecific abnormal finding of lung field: Secondary | ICD-10-CM | POA: Insufficient documentation

## 2017-09-12 HISTORY — DX: Other nonspecific abnormal finding of lung field: R91.8

## 2017-09-12 MED FILL — predniSONE 20 MG TABS: 20 | 7 days supply | Qty: 8 | Fill #0

## 2017-09-12 NOTE — Telephone Encounter (Signed)
Patient wants you to call him back about blood work

## 2017-09-15 ENCOUNTER — Telehealth: Payer: Self-pay

## 2017-09-15 NOTE — Telephone Encounter (Signed)
Contacted pt to go over appointment information for his CT pt didn't answer left a detailed vm    [facility] Pt is to arrive at 945am

## 2017-09-16 ENCOUNTER — Ambulatory Visit: Payer: Self-pay | Attending: Internal Medicine

## 2017-09-16 DIAGNOSIS — B182 Chronic viral hepatitis C: Secondary | ICD-10-CM | POA: Insufficient documentation

## 2017-09-16 MED FILL — traMADol HCL 50 MG TABS: 50 | 7 days supply | Qty: 15 | Fill #0

## 2017-09-16 NOTE — Progress Notes (Signed)
Patient here for lab visit only 

## 2017-09-17 ENCOUNTER — Ambulatory Visit: Payer: Self-pay | Admitting: Critical Care Medicine

## 2017-09-18 ENCOUNTER — Telehealth: Payer: Self-pay

## 2017-09-18 LAB — HEPATITIS B SURFACE ANTIGEN: Hepatitis B Surface Ag: NEGATIVE

## 2017-09-18 LAB — HEPATITIS B SURFACE ANTIBODY,QUALITATIVE: Hep B Surface Ab, Qual: NONREACTIVE

## 2017-09-18 LAB — HEPATITIS B E ANTIBODY: Hep B E Ab: NEGATIVE

## 2017-09-18 LAB — HEPATITIS A ANTIBODY, TOTAL: HEP A TOTAL AB: POSITIVE — AB

## 2017-09-18 NOTE — Telephone Encounter (Signed)
Contacted pt to go over lab results pt didn't answer left a detailed vm informing pt of results and if he has any questions or concerns to give me a call  If pt calls back please give results: that based on labs, look like he had hep A in past but not hep B. Will need vaccine for hep B on next visit.

## 2017-09-19 ENCOUNTER — Ambulatory Visit (HOSPITAL_COMMUNITY): Payer: Self-pay

## 2017-09-23 ENCOUNTER — Other Ambulatory Visit: Payer: Self-pay | Admitting: Internal Medicine

## 2017-09-23 ENCOUNTER — Ambulatory Visit (HOSPITAL_COMMUNITY)
Admission: RE | Admit: 2017-09-23 | Discharge: 2017-09-23 | Disposition: A | Discharge status: Home / Self Care | Payer: Self-pay | Source: Ambulatory Visit | Attending: Internal Medicine | Admitting: Internal Medicine

## 2017-09-23 ENCOUNTER — Telehealth: Payer: Self-pay | Admitting: Internal Medicine

## 2017-09-23 DIAGNOSIS — R918 Other nonspecific abnormal finding of lung field: Secondary | ICD-10-CM

## 2017-09-23 DIAGNOSIS — J439 Emphysema, unspecified: Secondary | ICD-10-CM | POA: Insufficient documentation

## 2017-09-23 DIAGNOSIS — Z8701 Personal history of pneumonia (recurrent): Secondary | ICD-10-CM | POA: Insufficient documentation

## 2017-09-23 DIAGNOSIS — I700 Atherosclerosis of aorta: Secondary | ICD-10-CM | POA: Insufficient documentation

## 2017-09-23 NOTE — Telephone Encounter (Signed)
Will forward to pcp

## 2017-09-23 NOTE — Telephone Encounter (Signed)
Want to speak with nurse because the patient is there but doesn't meet criteria for the cancer screening. Please follow up.

## 2017-09-24 NOTE — Telephone Encounter (Signed)
This has been addressed.  I spoke with the tech yesterday.

## 2017-09-29 ENCOUNTER — Telehealth: Payer: Self-pay | Admitting: Internal Medicine

## 2017-09-29 NOTE — Telephone Encounter (Signed)
   Phone call placed to patient this a.m. to go over the results of the CAT scan of the chest.  I left a message on voicemail letting him know that I was calling to discuss this result.  I will have my CMA reach out to him this week with results.

## 2017-09-29 NOTE — Telephone Encounter (Signed)
Contacted pt and went over CT results with pt. Pt is aware and I also provided number for [facility].

## 2017-10-15 ENCOUNTER — Ambulatory Visit: Payer: Self-pay | Admitting: Critical Care Medicine

## 2017-10-15 NOTE — Progress Notes (Deleted)
   Subjective:    Patient ID: Corey Lawrence, male    DOB: 02/22/1955, 62 y.o.   MRN: [id number]  HPI    Review of Systems     Objective:   Physical Exam        Assessment & Plan:

## 2017-10-21 ENCOUNTER — Ambulatory Visit: Payer: Self-pay | Attending: Internal Medicine | Admitting: Internal Medicine

## 2017-10-21 ENCOUNTER — Encounter: Payer: Self-pay | Admitting: Internal Medicine

## 2017-10-21 VITALS — BP 136/78 | HR 78 | Temp 98.0°F | Resp 16 | Wt 132.4 lb

## 2017-10-21 DIAGNOSIS — Z79899 Other long term (current) drug therapy: Secondary | ICD-10-CM | POA: Insufficient documentation

## 2017-10-21 DIAGNOSIS — Z79891 Long term (current) use of opiate analgesic: Secondary | ICD-10-CM | POA: Insufficient documentation

## 2017-10-21 DIAGNOSIS — R7303 Prediabetes: Secondary | ICD-10-CM | POA: Insufficient documentation

## 2017-10-21 DIAGNOSIS — J441 Chronic obstructive pulmonary disease with (acute) exacerbation: Secondary | ICD-10-CM | POA: Insufficient documentation

## 2017-10-21 DIAGNOSIS — F1190 Opioid use, unspecified, uncomplicated: Secondary | ICD-10-CM

## 2017-10-21 DIAGNOSIS — Z2821 Immunization not carried out because of patient refusal: Secondary | ICD-10-CM

## 2017-10-21 DIAGNOSIS — R918 Other nonspecific abnormal finding of lung field: Secondary | ICD-10-CM | POA: Insufficient documentation

## 2017-10-21 DIAGNOSIS — F411 Generalized anxiety disorder: Secondary | ICD-10-CM | POA: Insufficient documentation

## 2017-10-21 DIAGNOSIS — Z7951 Long term (current) use of inhaled steroids: Secondary | ICD-10-CM | POA: Insufficient documentation

## 2017-10-21 DIAGNOSIS — M5442 Lumbago with sciatica, left side: Secondary | ICD-10-CM | POA: Insufficient documentation

## 2017-10-21 DIAGNOSIS — F5102 Adjustment insomnia: Secondary | ICD-10-CM | POA: Insufficient documentation

## 2017-10-21 DIAGNOSIS — Z800 Family history of malignant neoplasm of digestive organs: Secondary | ICD-10-CM | POA: Insufficient documentation

## 2017-10-21 DIAGNOSIS — F17210 Nicotine dependence, cigarettes, uncomplicated: Secondary | ICD-10-CM | POA: Insufficient documentation

## 2017-10-21 DIAGNOSIS — B182 Chronic viral hepatitis C: Secondary | ICD-10-CM | POA: Insufficient documentation

## 2017-10-21 DIAGNOSIS — Z833 Family history of diabetes mellitus: Secondary | ICD-10-CM | POA: Insufficient documentation

## 2017-10-21 DIAGNOSIS — Z8249 Family history of ischemic heart disease and other diseases of the circulatory system: Secondary | ICD-10-CM | POA: Insufficient documentation

## 2017-10-21 MED ORDER — PREDNISONE 20 MG PO TABS: ORAL_TABLET | ORAL | 0 refills | Status: DC

## 2017-10-21 MED ORDER — AZITHROMYCIN 250 MG PO TABS: ORAL_TABLET | ORAL | 0 refills | Status: DC

## 2017-10-21 MED ORDER — TRAMADOL HCL 50 MG PO TABS: 50.0000 mg | ORAL_TABLET | Freq: Two times a day (BID) | ORAL | 0 refills | Status: DC | PRN

## 2017-10-21 MED ORDER — HEPATITIS B VAC RECOMBINANT 10 MCG/ML IJ SUSP: 1.0000 mL | Freq: Once | INTRAMUSCULAR | Status: DC

## 2017-10-21 MED ORDER — METHOCARBAMOL 500 MG PO TABS: 500.0000 mg | ORAL_TABLET | Freq: Two times a day (BID) | ORAL | 0 refills | Status: DC | PRN

## 2017-10-21 MED ORDER — TRAZODONE HCL 50 MG PO TABS: 25.0000 mg | ORAL_TABLET | Freq: Every evening | ORAL | 3 refills | Status: DC | PRN

## 2017-10-21 MED FILL — $INCRUSE ELLIPTA 62.5 MCG I: 62.5 MCG | 90 days supply | Qty: 90 | Fill #3

## 2017-10-21 MED FILL — predniSONE 20 MG TABS: 20 | 10 days supply | Qty: 11 | Fill #0

## 2017-10-21 MED FILL — $VENTOLIN HFA 18G INHALER: 108 (90 BAS | 75 days supply | Qty: 54 | Fill #2

## 2017-10-21 MED FILL — METHOCARBAMOL 500 MG TABS: 500 | 15 days supply | Qty: 30 | Fill #0

## 2017-10-21 MED FILL — AZITHROMYCIN 250 MG TABLET: 250 | 5 days supply | Qty: 6 | Fill #0

## 2017-10-21 MED FILL — traZODone HCL 50 MG TABS: 50 | 30 days supply | Qty: 30 | Fill #0

## 2017-10-21 MED FILL — $Dulera 100 mcg Inhaler: 100-5 | 90 days supply | Qty: 39 | Fill #3

## 2017-10-21 MED FILL — traMADol HCL 50 MG TABS: 50 | 30 days supply | Qty: 60 | Fill #0

## 2017-10-21 MED FILL — ?ALBUTEROL SUL 2.5 MG/3 MLS: (2.5 MG/3ML | 15 days supply | Qty: 180 | Fill #3

## 2017-10-21 NOTE — Progress Notes (Signed)
Patient ID: Corey Lawrence, male    DOB: 11/02/1955  MRN: [id number]  CC: Follow-up (1 month)   Subjective: Corey Lawrence is a 62 y.o. male who presents for 1 mth f/u His concerns today include:  COPD, lung nodules, Hep C, tob dep, chronic LBP and anxiety disorder.  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  He will be on this job for 3 months.  However since he has started working he has noted a flareup of his lower back pain.  He does get help with lifting on the job.  Pain is in the midline and radiates to the left leg to about the mid thigh.  No numbness or tingling.  No loss of bowel or bladder function.  He is requesting refill on tramadol to use as needed.  States that he was on tramadol before for his back issues.  Complains of problems sleeping over the past several weeks and requesting to be placed on trazodone.  Normally gets in bed around 10 pm and gets up at 5:30 a.m.  Daughter and his grandchildren moved in with him and now he has problems "shutting down his brain."  He still gets in bed around 10 PM and turns off all lights and sounds.  However he is unable to fall asleep until 12:30-1 a.m.  Drinks a cup of tea or mountain dew with supper at 7 p.m.  Hep C:  Has appt with ID next mth.  Labs done on last visit reveals that he is probably had hepatitis A in the past.  He needs to get the vaccine for hepatitis B.  COPD/lung nodules: Referred to pulmonary on last visit.  [facility] tried to reach him via phone unsuccessfully. -Recent CT scan of the chest revealed pulmonary nodules measuring up to 8 mm that are stable in size.  However there is a new nodule in the left lower lobe that measures 1.7 x 1 cm.  I wanted him to be evaluated by the pulmonary. Requesting a "shot of Prednisone."  C/o increase congestion and hard time breathing over past 5-6 days.  Cough productive of greenish yellow phlegm No fever  Reports compliance with Dulera and Incruse.  Uses mneb  BID.    Unfortunately he continues to smoke.  Patient Active Problem List   Diagnosis Date Noted  . Lung nodules 09/12/2017  . Tobacco dependence 09/12/2017  . Hep C w/o coma, chronic (HCC) 09/12/2017  . Generalized anxiety disorder 05/22/2016  . Prediabetes 09/29/2013  . Chronic low back pain 09/29/2013  . COPD (chronic obstructive pulmonary disease) (HCC) 09/21/2013     Current Outpatient Medications on File Prior to Visit  Medication Sig Dispense Refill  . acetaminophen (TYLENOL) 325 MG tablet Take 2 tablets (650 mg total) by mouth every 6 (six) hours as needed for mild pain (or Fever >/= 101).    Marland Kitchen albuterol (PROVENTIL HFA;VENTOLIN HFA) 108 (90 Base) MCG/ACT inhaler Inhale 1-2 puffs into the lungs every 6 (six) hours as needed for wheezing or shortness of breath. 2 Inhaler 6  . albuterol (PROVENTIL) (2.5 MG/3ML) 0.083% nebulizer solution Take 3 mLs (2.5 mg total) by nebulization every 6 (six) hours as needed for wheezing or shortness of breath. 150 mL 6  . diphenhydramine-acetaminophen (TYLENOL PM) 25-500 MG TABS tablet Take 1 tablet by mouth at bedtime as needed.    . mometasone-formoterol (DULERA) 100-5 MCG/ACT AERO Inhale 2 puffs into the lungs daily. 2 Inhaler 6  .  Multiple Vitamins-Minerals (MULTIVITAMIN ADULTS 50+) TABS Take 1 tablet by mouth daily.    Marland Kitchen. umeclidinium bromide (INCRUSE ELLIPTA) 62.5 MCG/INH AEPB Inhale 1 puff into the lungs daily. 1 each 12   No current facility-administered medications on file prior to visit.     No Known Allergies  Social History   Socioeconomic History  . Marital status: Divorced    Spouse name: Not on file  . Number of children: Not on file  . Years of education: Not on file  . Highest education level: Not on file  Occupational History  . Not on file  Social Needs  . Financial resource strain: Not on file  . Food insecurity:    Worry: Not on file    Inability: Not on file  . Transportation needs:    Medical: Not on file     Non-medical: Not on file  Tobacco Use  . Smoking status: Current Every Day Smoker    Packs/day: 0.25    Years: 43.00    Pack years: 10.75    Types: Cigarettes  . Smokeless tobacco: Never Used  . Tobacco comment: currently on patch  Substance and Sexual Activity  . 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 Drug use: Yes    Comment: 09/21/2013 "used marijuana once when I was 20; used cocaine  in ~ 2000; stopped in ~ 2003"  . Sexual activity: Not Currently  Lifestyle  . Physical activity:    Days per week: Not on file    Minutes per session: Not on file  . Stress: Not on file  Relationships  . Social connections:    Talks on phone: Not on file    Gets together: Not on file    Attends religious service: Not on file    Active member of club or organization: Not on file    Attends meetings of clubs or organizations: Not on file    Relationship status: Not on file  . Intimate partner violence:    Fear of current or ex partner: Not on file    Emotionally abused: Not on file    Physically abused: Not on file    Forced sexual activity: Not on file  Other Topics Concern  . Not on file  Social History Narrative   Lives in trailer.  Not married.  5 children.      Family History  Problem Relation Age of Onset  . Esophageal cancer Father   . Colon cancer Sister   . Cancer - Other Brother   . Heart disease Mother   . Diabetes Mellitus II Mother   . Heart disease Unknown        multiple maternal family members  . Diabetes Mellitus II Unknown        multiple maternal family members    Past Surgical History:  Procedure Laterality Date  . INCISION AND DRAINAGE OF WOUND Left ~ 1977   "splinter got infected between" digits 2 & 3    ROS: Review of Systems Negative except as above. PHYSICAL EXAM: BP 136/78   Pulse 78   Temp 98 F (36.7 C) (Oral)   Resp 16   Wt 132 lb 6.4 oz (60.1 kg)   SpO2 98%   BMI 24.22 kg/m     Physical Exam General appearance - alert, well appearing, and in no distress Mental status - normal mood, behavior, speech, dress, motor activity, and thought processes Chest -  patient with mild audible congestion and wheezing.  Mild to moderate wheezing on on auscultation.  No crackles heard. Heart -regular rate rhythm. Musculoskeletal -mild tenderness on palpation of the lumbar spine.  Straight leg raise reproduces pain in the lower back.  Power in both lower extremities 5/5 bilaterally.  Results for orders placed or performed in visit on 09/16/17  Hepatitis A antibody, total  Result Value Ref Range   Hep A Total Ab Positive (A) Negative  Hepatitis B e antibody  Result Value Ref Range   Hep B E Ab Negative Negative  Hepatitis B surface antibody,qualitative  Result Value Ref Range   Hep B Surface Ab, Qual Non Reactive   Hepatitis B surface antigen  Result Value Ref Range   Hepatitis B Surface Ag Negative Negative    ASSESSMENT AND PLAN: 1. COPD exacerbation (HCC) Continue to encourage patient to discontinue smoking. - predniSONE (DELTASONE) 20 MG tablet; 2 tabs po daily x 3 days, then 1 tabs x 3 days, then 1/2 tabs x 4 days,  Dispense: 11 tablet; Refill: 0 - azithromycin (ZITHROMAX) 250 MG tablet; 2 tabs PO x 1 then 1 tab PO daily  Dispense: 6 tablet; Refill: 0  2. Chronic hepatitis C without hepatic coma (HCC) - hepatitis b vaccine for adults (RECOMBIVAX-HB) injection 10 mcg Follow-up for nurse only visit for subsequent inj in series  3. Lung nodules Phone number given today followed by our pulmonary so that he can call and schedule his appointment  4. Adjustment insomnia Good sleep hygiene discussed.  Advised patient to not drink any caffeinated beverages past 5 PM in the evenings. - traZODone (DESYREL) 50 MG tablet; Take 0.5-1 tablets (25-50 mg total) by mouth at bedtime as needed for sleep.  Dispense: 30 tablet; Refill: 3  5. Acute midline low back pain with left-sided  sciatica Advised patient to try and avoid heavy lifting.  He does not want to quit the job that he currently has.  We will put him on tramadol and Robaxin to use as needed.  Until he is completed his job in 3 months.  Warned that both medications can cause drowsiness. My CMA went over the controlled substance prescribing agreement with him.  He denies any street drug use.  He is agreeable to drug screening today. N[facility] reviewed - traMADol (ULTRAM) 50 MG tablet; Take 1 tablet (50 mg total) by mouth every 12 (twelve) hours as needed.  Dispense: 60 tablet; Refill: 0 - methocarbamol (ROBAXIN) 500 MG tablet; Take 1 tablet (500 mg total) by mouth 2 (two) times daily as needed for muscle spasms.  Dispense: 30 tablet; Refill: 0 - Drug Screen 13 w/Conf, WB  6. Influenza vaccination declined  7. Chronic narcotic use - Drug Screen 13 w/Conf, WB   Patient was given the opportunity to ask questions.  Patient verbalized understanding of the plan and was able to repeat key elements of the plan.   Orders Placed This Encounter  Procedures  . Drug Screen 13 w/Conf, WB     Requested Prescriptions   Signed Prescriptions Disp Refills  . predniSONE (DELTASONE) 20 MG tablet 11 tablet 0    Sig: 2 tabs po daily x 3 days, then 1 tabs x 3 days, then 1/2 tabs x 4 days,  . traMADol (ULTRAM) 50 MG tablet 60 tablet 0    Sig: Take 1 tablet (50 mg total) by mouth every 12 (twelve) hours as needed.  . methocarbamol (ROBAXIN) 500 MG tablet 30 tablet 0  Sig: Take 1 tablet (500 mg total) by mouth 2 (two) times daily as needed for muscle spasms.  . traZODone (DESYREL) 50 MG tablet 30 tablet 3    Sig: Take 0.5-1 tablets (25-50 mg total) by mouth at bedtime as needed for sleep.  Marland Kitchen azithromycin (ZITHROMAX) 250 MG tablet 6 tablet 0    Sig: 2 tabs PO x 1 then 1 tab PO daily    Return in about 3 months (around 01/20/2018).  Jonah Blue, MD, FACP

## 2017-10-21 NOTE — Patient Instructions (Addendum)
Make a nurse visit in 1 month to get second Hep B injection  Stop drinking [facility] after 5 p.m.  Please call [facility] to schedule an appointment to see lung specialist about your COPD and lung nodules.   [facility] [address] [place], [place] [address]  [phone]   Hepatitis A; Hepatitis B Vaccine injection What is this medicine? HEPATITIS A VACCINE; HEPATITIS B VACCINE (hep uh TAHY tis A vak SEEN; hep uh TAHY tis B vak SEEN) is a vaccine to protect from an infection with the hepatitis A and B virus. This vaccine does not contain the live viruses. It will not cause a hepatitis infection. This medicine may be used for other purposes; ask your health care provider or pharmacist if you have questions. COMMON BRAND NAME(S): Twinrix What should I tell my health care provider before I take this medicine? They need to know if you have any of these conditions: -bleeding disorder -fever or infection -heart disease -immune system problems -an unusual or allergic reaction to hepatitis A or B vaccine, neomycin, yeast, thimerosal, other medicines, foods, dyes, or preservatives -pregnant or trying to get pregnant -breast-feeding How should I use this medicine? This vaccine is for injection into a muscle. It is given by a health care professional. A copy of Vaccine Information Statements will be given before each vaccination. Read this sheet carefully each time. The sheet may change frequently. Talk to your pediatrician regarding the use of this medicine in children. Special care may be needed. Overdosage: If you think you have taken too much of this medicine contact a poison control center or emergency room at once. NOTE: This medicine is only for you. Do not share this medicine with others. What if I miss a dose? It is important not to miss your dose. Call your doctor or health care professional if you are unable to keep an appointment. What  may interact with this medicine? -medicines that suppress your immune function like adalimumab, anakinra, infliximab -medicines to treat cancer -steroid medicines like prednisone or cortisone This list may not describe all possible interactions. Give your health care provider a list of all the medicines, herbs, non-prescription drugs, or dietary supplements you use. Also tell them if you smoke, drink alcohol, or use illegal drugs. Some items may interact with your medicine. What should I watch for while using this medicine? See your health care provider for all shots of this vaccine as directed. You must have 3 to 4 shots of this vaccine for protection from hepatitis A and B infection. Tell your doctor right away if you have any serious or unusual side effects after getting this vaccine. What side effects may I notice from receiving this medicine? Side effects that you should report to your doctor or health care professional as soon as possible: -allergic reactions like skin rash, itching or hives, swelling of the face, lips, or tongue -breathing problems -confused, irritated -fast, irregular heartbeat -flu-like syndrome -numb, tingling pain -seizures Side effects that usually do not require medical attention (report to your doctor or health care professional if they continue or are bothersome): -diarrhea -fever -headache -loss of appetite -muscle pain -nausea -pain, redness, swelling, or irritation at site where injected -tiredness This list may not describe all possible side effects. Call your doctor for medical advice about side effects. You may report side effects to FDA at 1-800-FDA-1088. Where should I keep my medicine? This drug is given in a hospital  or clinic and will not be stored at home. NOTE: This sheet is a summary. It may not cover all possible information. If you have questions about this medicine, talk to your doctor, pharmacist, or health care provider.  2018  Elsevier/Gold Standard (2007-06-05 15:21:37)

## 2017-10-21 NOTE — Progress Notes (Signed)
Pt is requesting a refill on prednisone and tramadol

## 2017-10-22 ENCOUNTER — Other Ambulatory Visit: Payer: Self-pay

## 2017-10-22 MED ORDER — HEPATITIS B VAC RECOMBINANT 10 MCG/0.5ML IJ SUSP: 0.5000 mL | Freq: Once | INTRAMUSCULAR | 2 refills | Status: AC

## 2017-10-27 ENCOUNTER — Encounter: Payer: Self-pay | Admitting: Infectious Diseases

## 2017-10-27 LAB — COCAINE,MS,WB/SP RFX
Benzoylecgonine: 285 ng/mL
Cocaine Confirmation: POSITIVE
Cocaine: NEGATIVE ng/mL

## 2017-10-27 LAB — DRUG SCREEN 13 W/CONF, WB
AMPHETAMINES, IA: NEGATIVE ng/mL
Barbiturates, IA: NEGATIVE ug/mL
Benzodiazepines, IA: NEGATIVE ng/mL
Cocaine/Metabolite, IA: POSITIVE ng/mL — AB
FENTANYL, IA: NEGATIVE ng/mL
METHADONE, IA: NEGATIVE ng/mL
Meperidine, IA: NEGATIVE ng/mL
Opiates, IA: NEGATIVE ng/mL
Oxycodones, IA: NEGATIVE ng/mL
PHENCYCLIDINE, IA: NEGATIVE ng/mL
Propoxyphene, IA: NEGATIVE ng/mL
THC (MARIJUANA) MTB,IA: NEGATIVE ng/mL
TRAMADOL, IA: NEGATIVE ng/mL

## 2017-10-28 ENCOUNTER — Telehealth: Payer: Self-pay

## 2017-10-28 NOTE — Telephone Encounter (Signed)
Contacted pt to go over drug screen results pt didn't answer lvm asking pt to give me a call at her earliest convenience   If pt calls back please give results: blood test was positive for cocaine. We will need to hold off on prescribing narcotics until I see him in f/u to have a discussion about this.

## 2017-11-20 ENCOUNTER — Telehealth: Payer: Self-pay | Admitting: *Deleted

## 2017-11-20 ENCOUNTER — Ambulatory Visit: Payer: Self-pay | Attending: Family Medicine | Admitting: *Deleted

## 2017-11-20 DIAGNOSIS — Z23 Encounter for immunization: Secondary | ICD-10-CM

## 2017-11-20 NOTE — Telephone Encounter (Signed)
Pt has a request for Tramadol at nurse visit.  Please advise

## 2017-11-20 NOTE — Telephone Encounter (Signed)
Pt request to have an appointment in November. Between 12th and 17th for med refill and follow -up  With Dr. Laural Benes.

## 2017-11-20 NOTE — Progress Notes (Signed)
Corey Lawrence received his 2nd Hep B  injection today.   Pt states he received 1 shot on the 10/21/2017. He verbalizes that he only took the Hep B injection.   Today was the 1st day of the taking a flu shot. He was called and pointed out on the AVS form that he still has that he declined the influenza injection. Will correct in the chart.

## 2017-11-21 NOTE — Telephone Encounter (Signed)
Pt scheduled for 12/16/2017

## 2017-11-25 NOTE — Telephone Encounter (Signed)
Contacted pt and made aware of Dr. Laural Benes response regarding Tramadol. Pt states that is fine

## 2017-12-02 NOTE — Addendum Note (Signed)
Addended by: Guy Franco on: 12/02/2017 10:30 AM   Modules accepted: Level of Service

## 2017-12-16 ENCOUNTER — Ambulatory Visit: Payer: Self-pay | Admitting: Internal Medicine

## 2017-12-31 MED FILL — $VENTOLIN HFA 18G INHALER: 108 (90 BAS | 25 days supply | Qty: 18 | Fill #1

## 2018-01-20 ENCOUNTER — Encounter: Payer: Self-pay | Admitting: Internal Medicine

## 2018-01-20 ENCOUNTER — Ambulatory Visit: Payer: Medicaid Other | Attending: Internal Medicine | Admitting: Internal Medicine

## 2018-01-20 VITALS — BP 116/77 | HR 67 | Temp 97.7°F | Resp 16 | Wt 130.6 lb

## 2018-01-20 DIAGNOSIS — R825 Elevated urine levels of drugs, medicaments and biological substances: Secondary | ICD-10-CM

## 2018-01-20 DIAGNOSIS — R918 Other nonspecific abnormal finding of lung field: Secondary | ICD-10-CM

## 2018-01-20 DIAGNOSIS — F17210 Nicotine dependence, cigarettes, uncomplicated: Secondary | ICD-10-CM

## 2018-01-20 DIAGNOSIS — R7303 Prediabetes: Secondary | ICD-10-CM | POA: Insufficient documentation

## 2018-01-20 DIAGNOSIS — F5102 Adjustment insomnia: Secondary | ICD-10-CM | POA: Diagnosis not present

## 2018-01-20 DIAGNOSIS — Z8249 Family history of ischemic heart disease and other diseases of the circulatory system: Secondary | ICD-10-CM | POA: Insufficient documentation

## 2018-01-20 DIAGNOSIS — Z833 Family history of diabetes mellitus: Secondary | ICD-10-CM | POA: Diagnosis not present

## 2018-01-20 DIAGNOSIS — F17200 Nicotine dependence, unspecified, uncomplicated: Secondary | ICD-10-CM

## 2018-01-20 DIAGNOSIS — J439 Emphysema, unspecified: Secondary | ICD-10-CM | POA: Diagnosis not present

## 2018-01-20 DIAGNOSIS — Z79899 Other long term (current) drug therapy: Secondary | ICD-10-CM | POA: Insufficient documentation

## 2018-01-20 DIAGNOSIS — B182 Chronic viral hepatitis C: Secondary | ICD-10-CM | POA: Diagnosis not present

## 2018-01-20 MED ORDER — PREDNISONE 20 MG PO TABS: ORAL_TABLET | ORAL | 0 refills | Status: DC

## 2018-01-20 MED ORDER — ALBUTEROL SULFATE HFA 108 (90 BASE) MCG/ACT IN AERS: 1.0000 | INHALATION_SPRAY | Freq: Four times a day (QID) | RESPIRATORY_TRACT | 6 refills | Status: DC | PRN

## 2018-01-20 MED ORDER — ALBUTEROL SULFATE (2.5 MG/3ML) 0.083% IN NEBU: 2.5000 mg | INHALATION_SOLUTION | Freq: Four times a day (QID) | RESPIRATORY_TRACT | 6 refills | Status: DC | PRN

## 2018-01-20 MED ORDER — MOMETASONE FURO-FORMOTEROL FUM 100-5 MCG/ACT IN AERO: 2.0000 | INHALATION_SPRAY | Freq: Every day | RESPIRATORY_TRACT | 6 refills | Status: DC

## 2018-01-20 MED ORDER — TRAZODONE HCL 50 MG PO TABS: 25.0000 mg | ORAL_TABLET | Freq: Every evening | ORAL | 6 refills | Status: DC | PRN

## 2018-01-20 MED FILL — ALBUTEROL SUL 2.5 MG/3 ML S: (2.5 MG/3ML | 15 days supply | Qty: 180 | Fill #4

## 2018-01-20 MED FILL — predniSONE 20 MG TABS: 20 | 10 days supply | Qty: 11 | Fill #0

## 2018-01-20 MED FILL — $INCRUSE ELLIPTA 62.5 MCG I: 62.5 MCG | 90 days supply | Qty: 90 | Fill #4

## 2018-01-20 MED FILL — $VENTOLIN HFA 18G INHALER: 108 (90 BAS | 75 days supply | Qty: 54 | Fill #2

## 2018-01-20 MED FILL — ?TRAZODONE HCL 50MG TABS: 50 | 30 days supply | Qty: 30 | Fill #1

## 2018-01-20 NOTE — Patient Instructions (Signed)
Please give appointment with Dr. Delford FieldWright in 4 wk.

## 2018-01-20 NOTE — Progress Notes (Signed)
Patient ID: Corey Lawrence, male    DOB: 1956-01-09  MRN: [id number]  CC: Follow-up (3 month )   Subjective: Corey Lawrence is a 62 y.o. male who presents for chronic ds management His concerns today include:  COPD, lung nodules, Hep C, tob dep, chronic LBP and anxiety disorder.  COPD/lung nodules:  Pt was called by [facility] since last visit.  Pt reports that he tried returning the call unsuccessfully.  CT scan of the chest revealed pulmonary nodules measuring up to 8 mm that are stable in size.  However there is a new nodule in the left lower lobe that measures 1.7 x 1 cm.  I wanted him to be evaluated by the pulmonary. His OC will expire next mth.  He plans to apply for Medicaid, needs letter stating he is unable to work to support his application.   -feels his breathing is worse.  Can not walk more than 50 feet without stopping to catch his breath.  Lacks energy to do his usual daily activities.  Chronic productive cough, no blood in phlem Needs RF on Dulera, Incruse and Albuterol.  Does neb treatment 2-3 x a day He has cut back on smoking.  "I smoked only 16 cig since thankgsgiving because I can't breatth.  If I smoke, I cough until my head hurts."  He has access to nicotine patches and has been using intermittently  Hep C:  Missed ID appt 10/2017.  Reports he has transportation issues.  Does not drive. UDS pos for cocaine on last visit.  He denies use.  Pt states he lives in a camper and constantly around other people  who use cocaine and marijuana.  He thinks his UDS was positive because he inhaled it when other people were using it Patient Active Problem List   Diagnosis Date Noted  . Lung nodules 09/12/2017  . Tobacco dependence 09/12/2017  . Hep C w/o coma, chronic (HCC) 09/12/2017  . Generalized anxiety disorder 05/22/2016  . Prediabetes 09/29/2013  . Chronic low back pain 09/29/2013  . COPD (chronic obstructive pulmonary disease) (HCC) 09/21/2013     Current  Outpatient Medications on File Prior to Visit  Medication Sig Dispense Refill  . acetaminophen (TYLENOL) 325 MG tablet Take 2 tablets (650 mg total) by mouth every 6 (six) hours as needed for mild pain (or Fever >/= 101).    Marland Kitchen albuterol (PROVENTIL HFA;VENTOLIN HFA) 108 (90 Base) MCG/ACT inhaler Inhale 1-2 puffs into the lungs every 6 (six) hours as needed for wheezing or shortness of breath. 2 Inhaler 6  . albuterol (PROVENTIL) (2.5 MG/3ML) 0.083% nebulizer solution Take 3 mLs (2.5 mg total) by nebulization every 6 (six) hours as needed for wheezing or shortness of breath. 150 mL 6  . azithromycin (ZITHROMAX) 250 MG tablet 2 tabs PO x 1 then 1 tab PO daily (Patient not taking: Reported on 01/20/2018) 6 tablet 0  . diphenhydramine-acetaminophen (TYLENOL PM) 25-500 MG TABS tablet Take 1 tablet by mouth at bedtime as needed.    . methocarbamol (ROBAXIN) 500 MG tablet Take 1 tablet (500 mg total) by mouth 2 (two) times daily as needed for muscle spasms. 30 tablet 0  . mometasone-formoterol (DULERA) 100-5 MCG/ACT AERO Inhale 2 puffs into the lungs daily. 2 Inhaler 6  . Multiple Vitamins-Minerals (MULTIVITAMIN ADULTS 50+) TABS Take 1 tablet by mouth daily.    . predniSONE (DELTASONE) 20 MG tablet 2 tabs po daily x 3 days, then 1 tabs x 3 days,  then 1/2 tabs x 4 days, (Patient not taking: Reported on 01/20/2018) 11 tablet 0  . traMADol (ULTRAM) 50 MG tablet Take 1 tablet (50 mg total) by mouth every 12 (twelve) hours as needed. 60 tablet 0  . traZODone (DESYREL) 50 MG tablet Take 0.5-1 tablets (25-50 mg total) by mouth at bedtime as needed for sleep. 30 tablet 3  . umeclidinium bromide (INCRUSE ELLIPTA) 62.5 MCG/INH AEPB Inhale 1 puff into the lungs daily. 1 each 12   No current facility-administered medications on file prior to visit.     No Known Allergies  Social History   Socioeconomic History  . Marital status: Divorced    Spouse name: Not on file  . Number of children: Not on file  . Years  of education: Not on file  . Highest education level: Not on file  Occupational History  . Not on file  Social Needs  . Financial resource strain: Not on file  . Food insecurity:    Worry: Not on file    Inability: Not on file  . Transportation needs:    Medical: Not on file    Non-medical: Not on file  Tobacco Use  . Smoking status: Current Every Day Smoker    Packs/day: 0.25    Years: 43.00    Pack years: 10.75    Types: Cigarettes  . Smokeless tobacco: Never Used  . Tobacco comment: currently on patch  Substance and Sexual Activity  . 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 Drug use: Yes    Comment: 09/21/2013 "used marijuana once when I was 20; used cocaine  in ~ 2000; stopped in ~ 2003"  . Sexual activity: Not Currently  Lifestyle  . Physical activity:    Days per week: Not on file    Minutes per session: Not on file  . Stress: Not on file  Relationships  . Social connections:    Talks on phone: Not on file    Gets together: Not on file    Attends religious service: Not on file    Active member of club or organization: Not on file    Attends meetings of clubs or organizations: Not on file    Relationship status: Not on file  . Intimate partner violence:    Fear of current or ex partner: Not on file    Emotionally abused: Not on file    Physically abused: Not on file    Forced sexual activity: Not on file  Other Topics Concern  . Not on file  Social History Narrative   Lives in trailer.  Not married.  5 children.      Family History  Problem Relation Age of Onset  . Esophageal cancer Father   . Colon cancer Sister   . Cancer - Other Brother   . Heart disease Mother   . Diabetes Mellitus II Mother   . Heart disease Unknown        multiple maternal family members  . Diabetes Mellitus II Unknown        multiple maternal family members    Past Surgical History:  Procedure Laterality Date  .  INCISION AND DRAINAGE OF WOUND Left ~ 1977   "splinter got infected between" digits 2 & 3    ROS: Review of Systems Neg except as above  PHYSICAL EXAM: BP 116/77   Pulse 67   Temp 97.7 F (36.5 C) (Oral)  Resp 16   Wt 130 lb 9.6 oz (59.2 kg)   SpO2 98%   BMI 23.89 kg/m   Wt Readings from Last 3 Encounters:  01/20/18 130 lb 9.6 oz (59.2 kg)  10/21/17 132 lb 6.4 oz (60.1 kg)  09/11/17 129 lb 3.2 oz (58.6 kg)    Physical Exam  General appearance - alert, well appearing, and in no distress Mental status - normal mood, behavior, speech, dress, motor activity, and thought processes Neck - supple, no significant adenopathy Chest - mild to moderate diffuse exp wheezes Heart - normal rate, regular rhythm, normal S1, S2, no murmurs, rubs, clicks or gallops Extremities - no LE edema  Depression screen [facility] 2/9 03/12/2017 08/14/2016 05/22/2016  Decreased Interest 2 3 2   Down, Depressed, Hopeless 2 3 1   PHQ - 2 Score 4 6 3   Altered sleeping 3 3 3   Tired, decreased energy 1 2 3   Change in appetite 1 0 0  Feeling bad or failure about yourself  0 2 2  Trouble concentrating 3 3 3   Moving slowly or fidgety/restless 3 3 0  Suicidal thoughts 0 0 0  PHQ-9 Score 15 19 14     ASSESSMENT AND PLAN:  1. Pulmonary emphysema, unspecified emphysema type (HCC) Would benefit from pulmonary rehab but pt reports transportation issues Will try to get him in with Dr. Delford FieldWright here at our clinic Will write letter to assist with him getting Medicaid - albuterol (PROVENTIL) (2.5 MG/3ML) 0.083% nebulizer solution; Take 3 mLs (2.5 mg total) by nebulization every 6 (six) hours as needed for wheezing or shortness of breath.  Dispense: 150 mL; Refill: 6 - mometasone-formoterol (DULERA) 100-5 MCG/ACT AERO; Inhale 2 puffs into the lungs daily.  Dispense: 2 Inhaler; Refill: 6 - albuterol (PROVENTIL HFA;VENTOLIN HFA) 108 (90 Base) MCG/ACT inhaler; Inhale 1-2 puffs into the lungs every 6 (six) hours as needed for  wheezing or shortness of breath.  Dispense: 2 Inhaler; Refill: 6 - predniSONE (DELTASONE) 20 MG tablet; 2 tabs po daily x 3 days, then 1 tabs x 3 days, then 1/2 tabs x 4 days,  Dispense: 11 tablet; Refill: 0  2. Tobacco dependence Advised to quit Commended him on cutting back trying to quit.  Encourage him to use nicotine patches consistently   3. Lung nodules Trying to get him in with Dr, Delford FieldWright.    4. Adjustment insomnia RF Trazodone - traZODone (DESYREL) 50 MG tablet; Take 0.5-1 tablets (25-50 mg total) by mouth at bedtime as needed for sleep.  Dispense: 30 tablet; Refill: 6  5. Chronic hepatitis C without hepatic coma (HCC) Will try to get him in with ID again  Ambulatory referral to Infectious Disease  6. Positive urine drug screen Likely pt is using but denies. Advise that if he is using, not helping his over all health.  Patient was given the opportunity to ask questions.  Patient verbalized understanding of the plan and was able to repeat key elements of the plan.   No orders of the defined types were placed in this encounter.    Requested Prescriptions    No prescriptions requested or ordered in this encounter    No follow-ups on file.  Jonah Blueeborah Nabil Bubolz, MD, FACP

## 2018-02-06 NOTE — Progress Notes (Signed)
Patient Name: Corey HazardDavid W Mooers  Date of Birth: 09/24/1955  MRN: [id number]  PCP: Marcine MatarJohnson, Deborah B, MD  Referring Provider: Marcine MatarJohnson, Deborah B, MD   Patient Active Problem List   Diagnosis Date Noted  . Urinary hesitancy 02/09/2018  . Lung nodules 09/12/2017  . Tobacco dependence 09/12/2017  . Hep C w/o coma, chronic (HCC) 09/12/2017  . Generalized anxiety disorder 05/22/2016  . Prediabetes 09/29/2013  . Chronic low back pain 09/29/2013  . COPD (chronic obstructive pulmonary disease) (HCC) 09/21/2013   CC:  Hepatitis C infection; treatment evaluation and discussion   HPI/ROS:  Corey HazardDavid W Kyer is a 63 y.o. male . Lab work in June of 2019 revealed positive hepatitis c antibody with reflex qualitative RNA positive later that month. He had a positive cocaine screen recently per chart review. Stopped drinking alcohol in 1994, smokes cigarettes currently daily, no other injection drug use since the 1970s. He estimates 137 lifetime sexual partners and has had tattoos in prison in the past as well. Denies any blood transfusions. He is currently married and tells me his wife has hep c also but is in denial about it.   Patient does have documented immunity to Hepatitis A (acquired through natural infection when he was 63 years old). Patient does not have documented immunity to Hepatitis B but receiving vaccine series from PCP. HIV screen was negative in June of this year as well.   Constitutional: negative for fevers, chills, malaise, anorexia and weight loss Eyes: negative for icterus Respiratory: positive for dyspnea on exertion, cough, sputum production, shortness of breath  Cardiovascular: negative for dyspnea, lower extremity edema Gastrointestinal: negative for diarrhea, abdominal pain and jaundice; positive for constipation  Genitourinary: negative for hematuria; positive for hesitancy and decreased stream Hematologic/lymphatic: negative for bleeding and petechiae Musculoskeletal:  negative for arthralgias and muscle weakness; positive for back pain  Neurological: negative for headaches, coordination problems and gait problems All other systems reviewed and are negative      Past Medical History:  Diagnosis Date  . Ankle fracture, left    "casted; no OR"  . Chronic lower back pain   . COPD (chronic obstructive pulmonary disease) (HCC)   . Tobacco abuse     Prior to Admission medications   Medication Sig Start Date End Date Taking? Authorizing Provider  acetaminophen (TYLENOL) 325 MG tablet Take 2 tablets (650 mg total) by mouth every 6 (six) hours as needed for mild pain (or Fever >/= 101). 09/23/13   Christiane HaSullivan, Corinna L, MD  albuterol (PROVENTIL HFA;VENTOLIN HFA) 108 (90 Base) MCG/ACT inhaler Inhale 1-2 puffs into the lungs every 6 (six) hours as needed for wheezing or shortness of breath. 01/20/18   Marcine MatarJohnson, Deborah B, MD  albuterol (PROVENTIL) (2.5 MG/3ML) 0.083% nebulizer solution Take 3 mLs (2.5 mg total) by nebulization every 6 (six) hours as needed for wheezing or shortness of breath. 01/20/18   Marcine MatarJohnson, Deborah B, MD  diphenhydramine-acetaminophen (TYLENOL PM) 25-500 MG TABS tablet Take 1 tablet by mouth at bedtime as needed.    [provider]  methocarbamol (ROBAXIN) 500 MG tablet Take 1 tablet (500 mg total) by mouth 2 (two) times daily as needed for muscle spasms. 10/21/17   Marcine MatarJohnson, Deborah B, MD  mometasone-formoterol (DULERA) 100-5 MCG/ACT AERO Inhale 2 puffs into the lungs daily. 01/20/18   Marcine MatarJohnson, Deborah B, MD  Multiple Vitamins-Minerals (MULTIVITAMIN ADULTS 50+) TABS Take 1 tablet by mouth daily.    [provider]  predniSONE (DELTASONE) 20 MG tablet  2 tabs po daily x 3 days, then 1 tabs x 3 days, then 1/2 tabs x 4 days, 01/20/18   Marcine Matar, MD  traMADol (ULTRAM) 50 MG tablet Take 1 tablet (50 mg total) by mouth every 12 (twelve) hours as needed. 10/21/17   Marcine Matar, MD  traZODone (DESYREL) 50 MG tablet Take 0.5-1  tablets (25-50 mg total) by mouth at bedtime as needed for sleep. 01/20/18   Marcine Matar, MD  umeclidinium bromide (INCRUSE ELLIPTA) 62.5 MCG/INH AEPB Inhale 1 puff into the lungs daily. 07/22/17   Marcine Matar, MD    No Known Allergies  Social History   Tobacco Use  . Smoking status: Current Every Day Smoker    Packs/day: 0.25    Years: 43.00    Pack years: 10.75    Types: Cigarettes  . Smokeless tobacco: Never Used  . Tobacco comment: currently on patch  Substance Use Topics  . 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 Drug use: Yes    Comment: 09/21/2013 "used marijuana once when I was 20; used cocaine  in ~ 2000; stopped in ~ 2003"    Family History  Problem Relation Age of Onset  . Esophageal cancer Father   . Colon cancer Sister   . Cancer - Other Brother   . Heart disease Mother   . Diabetes Mellitus II Mother   . Heart disease Unknown        multiple maternal family members  . Diabetes Mellitus II Unknown        multiple maternal family members    Objective:   Vitals:   02/09/18 0923  BP: 104/66  Pulse: 79  Temp: 98.6 F (37 C)   Constitutional: in no apparent distress, in no respiratory distress and acyanotic and oriented times 3 Eyes: anicteric Cardiovascular: Cor RRR and No murmurs Respiratory: coarse rhonchi throughout chest a/p; cough demonstrated; expiratory wheezing  Gastrointestinal: Bowel sounds are normal, liver is not enlarged, spleen is not enlarged Musculoskeletal: peripheral pulses normal, no pedal edema, no clubbing or cyanosis Skin: negative for - jaundice, spider hemangioma, telangiectasia, palmar erythema, ecchymosis and atrophy; no porphyria cutanea tarda Lymphatic: no cervical lymphadenopathy  Laboratory: Genotype: No results found for: HCVGENOTYPE HCV viral load: No results found for: HCVQUANT   Lab Results  Component Value Date   WBC 5.8 07/22/2017   HGB  14.7 07/22/2017   HCT 43.4 07/22/2017   MCV 94 07/22/2017   PLT 252 07/22/2017    Lab Results  Component Value Date   CREATININE 0.84 07/22/2017   BUN 12 07/22/2017   NA 141 07/22/2017   K 4.4 07/22/2017   CL 101 07/22/2017   CO2 23 07/22/2017    Lab Results  Component Value Date   ALT 97 (H) 07/22/2017   AST 61 (H) 07/22/2017   ALKPHOS 114 07/22/2017    Lab Results  Component Value Date   BILITOT 0.3 07/22/2017   ALBUMIN 4.3 07/22/2017    APRI Score - 0.250 FIB4 Score - 1.50  Imaging:  none  Assessment & Plan:   Problem List Items Addressed This Visit      Unprioritized   Hep C 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   I discussed with the patient the lab findings that confirm chronic hepatitis C as well as the natural history and  progression of disease including about 30% of people who develop cirrhosis of the liver if left untreated and once cirrhosis is established there is a 2-7% risk per year of liver cancer and liver failure.  I discussed the importance of treatment and benefits in reducing the risk, even if significant liver fibrosis exists. I also discussed risk for re-infection following treatment should he not continue to modify risk factors.    Patient counseled extensively on limiting acetaminophen to no more than 2 grams daily >> this will be very difficult for him but I explained that this could be a contributing reason that his LFTs remain as elevated. Counseled to continue with avoidance of alcohol.  Transmission discussed with patient including sexual transmission, sharing razors and toothbrush.   Will need referral to gastroenterology if concern for cirrhosis for consideration of variceal screening and liver care.   Will prescribe appropriate medication based on genotype and coverage >> likely Mavyret to offer 8 week cure.   Hepatitis A immune following natural infection; hep B  vaccines currently beng given by PCP  Pneumovax vaccine at upcoming visit if not previously given  Further work up to include liver staging through non-invasive serum analysis with APRI and FIB4 scores and Liver Fibrosis panel; U/S to follow if discordant or concerning results.  Will call Corey Hazardavid W Terra back once all results are in and counsel on medication over the phone. He will return 4 weeks after starting to meet with pharmacy team and check RNA at that time.        Relevant Orders   Hepatitis C genotype   Hepatitis C RNA quantitative ([facility])   Protime-INR   Liver Fibrosis, FibroTest-ActiTest   Comprehensive metabolic panel   CBC   Urinary hesitancy    Seems he has urinary hesitancy most likely related to prostate enlargement. I asked him to consider discussion with his PCP about trial of Flomax to see if this would alleviate some of his symptoms.         I spent 45 minutes with the patient including greater than 70% of time in face to face counsel of the patient re hepatitis c and the details described above and in coordination of their care.  Rexene AlbertsStephanie Maricarmen Braziel, MSN, NP-C [facility]  [email or web address] Pager: [phone] Office: [phone]

## 2018-02-09 ENCOUNTER — Encounter: Payer: Self-pay | Admitting: Infectious Diseases

## 2018-02-09 ENCOUNTER — Ambulatory Visit (INDEPENDENT_AMBULATORY_CARE_PROVIDER_SITE_OTHER): Payer: Self-pay | Admitting: Infectious Diseases

## 2018-02-09 VITALS — BP 104/66 | HR 79 | Temp 98.6°F | Wt 131.8 lb

## 2018-02-09 DIAGNOSIS — R3911 Hesitancy of micturition: Secondary | ICD-10-CM | POA: Insufficient documentation

## 2018-02-09 DIAGNOSIS — B182 Chronic viral hepatitis C: Secondary | ICD-10-CM

## 2018-02-09 NOTE — Assessment & Plan Note (Signed)
Seems he has urinary hesitancy most likely related to prostate enlargement. I asked him to consider discussion with his PCP about trial of Flomax to see if this would alleviate some of his symptoms.

## 2018-02-09 NOTE — Assessment & Plan Note (Signed)
New Patient with Chronic Hepatitis C genotype unknown, treatment naive.   I discussed with the patient the lab findings that confirm chronic hepatitis C as well as the natural history and progression of disease including about 30% of people who develop cirrhosis of the liver if left untreated and once cirrhosis is established there is a 2-7% risk per year of liver cancer and liver failure.  I discussed the importance of treatment and benefits in reducing the risk, even if significant liver fibrosis exists. I also discussed risk for re-infection following treatment should he not continue to modify risk factors.    Patient counseled extensively on limiting acetaminophen to no more than 2 grams daily >> this will be very difficult for him but I explained that this could be a contributing reason that his LFTs remain as elevated. Counseled to continue with avoidance of alcohol.  Transmission discussed with patient including sexual transmission, sharing razors and toothbrush.   Will need referral to gastroenterology if concern for cirrhosis for consideration of variceal screening and liver care.   Will prescribe appropriate medication based on genotype and coverage >> likely Mavyret to offer 8 week cure.   Hepatitis A immune following natural infection; hep B vaccines currently beng given by PCP  Pneumovax vaccine at upcoming visit if not previously given  Further work up to include liver staging through non-invasive serum analysis with APRI and FIB4 scores and Liver Fibrosis panel; U/S to follow if discordant or concerning results.  Will call [name] back once all results are in and counsel on medication over the phone. He will return 4 weeks after starting to meet with pharmacy team and check RNA at that time.

## 2018-02-09 NOTE — Progress Notes (Signed)
HPI: Corey Lawrence is a 63 y.o. male who presents to the [facility] to initiate care for his chronic Hepatitis C infection.   Patient Active Problem List   Diagnosis Date Noted  . Urinary hesitancy 02/09/2018  . Lung nodules 09/12/2017  . Tobacco dependence 09/12/2017  . Hep C w/o coma, chronic ([place]) 09/12/2017  . Generalized anxiety disorder 05/22/2016  . Prediabetes 09/29/2013  . Chronic low back pain 09/29/2013  . COPD (chronic obstructive pulmonary disease) ([place]) 09/21/2013    Patient's Medications  New Prescriptions   No medications on file  Previous Medications   ACETAMINOPHEN (TYLENOL) 325 MG TABLET    Take 2 tablets (650 mg total) by mouth every 6 (six) hours as needed for mild pain (or Fever >/= 101).   ALBUTEROL (PROVENTIL HFA;VENTOLIN HFA) 108 (90 BASE) MCG/ACT INHALER    Inhale 1-2 puffs into the lungs every 6 (six) hours as needed for wheezing or shortness of breath.   ALBUTEROL (PROVENTIL) (2.5 MG/3ML) 0.083% NEBULIZER SOLUTION    Take 3 mLs (2.5 mg total) by nebulization every 6 (six) hours as needed for wheezing or shortness of breath.   DIPHENHYDRAMINE-ACETAMINOPHEN (TYLENOL PM) 25-500 MG TABS TABLET    Take 1 tablet by mouth at bedtime as needed.   METHOCARBAMOL (ROBAXIN) 500 MG TABLET    Take 1 tablet (500 mg total) by mouth 2 (two) times daily as needed for muscle spasms.   MOMETASONE-FORMOTEROL (DULERA) 100-5 MCG/ACT AERO    Inhale 2 puffs into the lungs daily.   MULTIPLE VITAMINS-MINERALS (MULTIVITAMIN ADULTS 50+) TABS    Take 1 tablet by mouth daily.   PREDNISONE (DELTASONE) 20 MG TABLET    2 tabs po daily x 3 days, then 1 tabs x 3 days, then 1/2 tabs x 4 days,   TRAMADOL (ULTRAM) 50 MG TABLET    Take 1 tablet (50 mg total) by mouth every 12 (twelve) hours as needed.   TRAZODONE (DESYREL) 50 MG TABLET    Take 0.5-1 tablets (25-50 mg total) by mouth at bedtime as needed for sleep.   UMECLIDINIUM BROMIDE (INCRUSE ELLIPTA) 62.5 MCG/INH AEPB    Inhale 1 puff  into the lungs daily.  Modified Medications   No medications on file  Discontinued Medications   No medications on file    Allergies: No Known Allergies  Past Medical History: Past Medical History:  Diagnosis Date  . Ankle fracture, left    "casted; no OR"  . Chronic lower back pain   . COPD (chronic obstructive pulmonary disease) ([place])   . Tobacco abuse     Social History: Social History   Socioeconomic History  . Marital status: Divorced    Spouse name: Not on file  . Number of children: Not on file  . Years of education: Not on file  . Highest education level: Not on file  Occupational History  . Not on file  Social Needs  . Financial resource strain: Not on file  . Food insecurity:    Worry: Not on file    Inability: Not on file  . Transportation needs:    Medical: Not on file    Non-medical: Not on file  Tobacco Use  . Smoking status: Current Every Day Smoker    Packs/day: 0.25    Years: 43.00    Pack years: 10.75    Types: Cigarettes  . Smokeless tobacco: Never Used  . Tobacco comment: currently on patch  Substance and Sexual Activity  . Alcohol  use: No    Alcohol/week: 0.0 standard drinks    Comment: "recovering alcoholic 8/91/6945"  . Drug use: Yes    Comment: 09/21/2013 "used marijuana once when I was 20; used cocaine  in ~ 2000; stopped in ~ 2003"  . Sexual activity: Not Currently  Lifestyle  . Physical activity:    Days per week: Not on file    Minutes per session: Not on file  . Stress: Not on file  Relationships  . Social connections:    Talks on phone: Not on file    Gets together: Not on file    Attends religious service: Not on file    Active member of club or organization: Not on file    Attends meetings of clubs or organizations: Not on file    Relationship status: Not on file  Other Topics Concern  . Not on file  Social History Narrative   Lives in trailer.  Not married.  5 children.      Labs: Hepatitis C No results found  for: HCVGENOTYPE, HEPCAB, HCVRNAPCRQN, FIBROSTAGE Hepatitis B Lab Results  Component Value Date   HEPBSAG Negative 09/16/2017   Hepatitis A Lab Results  Component Value Date   HAV Positive (A) 09/16/2017   HIV Lab Results  Component Value Date   HIV Non Reactive 07/22/2017   Lab Results  Component Value Date   CREATININE 0.84 07/22/2017   CREATININE 0.99 04/04/2017   CREATININE 1.02 08/14/2016   CREATININE 1.02 05/08/2015   CREATININE 1.04 12/05/2014   Lab Results  Component Value Date   AST 61 (H) 07/22/2017   AST 63 (H) 08/14/2016   AST 14 12/05/2014   ALT 97 (H) 07/22/2017   ALT 104 (H) 08/14/2016   ALT 12 12/05/2014    Assessment: Corey Lawrence is here today to initiate care with [facility] for his Hepatitis C infection. We only have a positive Hep C antibody and positive qualitative RNA, so we will need to draw labs today.  I met with patient and explained the process for obtaining medication today and gave him my card for future questions/concerns.  He is uninsured, so he also met with Inez [place] and signed paperwork for both [place] and [place].  Will assess once labs return and send in medication based on results. He will follow-up with me once he is on medication.   Plan: - Hep C genotype, viral load, fibrosure today - F/u once labs are back  Bentlie Catanzaro L. Adryan Shin, PharmD, BCIDP, AAHIVP, [facility] 02/09/2018, 11:31 AM

## 2018-02-09 NOTE — Patient Instructions (Signed)
For your urinary symptoms - I want you to talk with Dr. Laural Benes about if Flomax would be good for you. This is one pill once a day that I think will ease your difficulty starting urine stream.   Nice to meet you today!    We need to get a little more information about your hepatitis c infection before we start your treatment. I anticipate that we can get you started in a few weeks.    Until we can get you treated would recommend: - condoms with sexual encounters or abstinence - no sharing of razors, toothbrushes or anything that could potentially have blood on it.  - limit alcohol to as little as possible to less than 1 drink a day  - limit tylenol use to less than 2,000 mg daily (this is only 4 extra strength pills or 6 regular strength pills in 24 hour period).     We will call you with the blood work results and plan for starting your treatment.   I would recommend your wife have blood work done and be treated too if needed to prevent progression to cirrhosis or liver cancer.

## 2018-02-10 ENCOUNTER — Encounter: Payer: Self-pay | Admitting: Infectious Diseases

## 2018-02-11 ENCOUNTER — Ambulatory Visit: Payer: Self-pay | Admitting: Infectious Diseases

## 2018-02-12 LAB — PROTIME-INR
INR: 1
Prothrombin Time: 10.5 s (ref 9.0–11.5)

## 2018-02-12 LAB — LIVER FIBROSIS, FIBROTEST-ACTITEST
ALT: 60 U/L — ABNORMAL HIGH (ref 9–46)
APOLIPOPROTEIN A1: 107 mg/dL (ref 94–176)
Alpha-2-Macroglobulin: 429 mg/dL — ABNORMAL HIGH (ref 106–279)
Bilirubin: 0.6 mg/dL (ref 0.2–1.2)
Fibrosis Score: 0.87
GGT: 64 U/L (ref 3–70)
HAPTOGLOBIN: 63 mg/dL (ref 43–212)
Necroinflammat ACT Score: 0.57
REFERENCE ID: 2797580

## 2018-02-12 LAB — CBC
HCT: 41.5 % (ref 38.5–50.0)
HEMOGLOBIN: 14.3 g/dL (ref 13.2–17.1)
MCH: 32.8 pg (ref 27.0–33.0)
MCHC: 34.5 g/dL (ref 32.0–36.0)
MCV: 95.2 fL (ref 80.0–100.0)
MPV: 10 fL (ref 7.5–12.5)
Platelets: 157 10*3/uL (ref 140–400)
RBC: 4.36 10*6/uL (ref 4.20–5.80)
RDW: 12 % (ref 11.0–15.0)
WBC: 4.5 10*3/uL (ref 3.8–10.8)

## 2018-02-12 LAB — HEPATITIS C RNA QUANTITATIVE
HCV Quantitative Log: 6.75 Log IU/mL — ABNORMAL HIGH
HCV RNA, PCR, QN: 5650000 IU/mL — ABNORMAL HIGH

## 2018-02-12 LAB — COMPREHENSIVE METABOLIC PANEL
AG Ratio: 1.7 (calc) (ref 1.0–2.5)
ALT: 57 U/L — ABNORMAL HIGH (ref 9–46)
AST: 64 U/L — ABNORMAL HIGH (ref 10–35)
Albumin: 4.4 g/dL (ref 3.6–5.1)
Alkaline phosphatase (APISO): 61 U/L (ref 40–115)
BUN: 10 mg/dL (ref 7–25)
CO2: 28 mmol/L (ref 20–32)
Calcium: 9.7 mg/dL (ref 8.6–10.3)
Chloride: 103 mmol/L (ref 98–110)
Creat: 0.86 mg/dL (ref 0.70–1.25)
Globulin: 2.6 g/dL (calc) (ref 1.9–3.7)
Glucose, Bld: 88 mg/dL (ref 65–99)
Potassium: 3.7 mmol/L (ref 3.5–5.3)
Sodium: 139 mmol/L (ref 135–146)
Total Bilirubin: 0.6 mg/dL (ref 0.2–1.2)
Total Protein: 7 g/dL (ref 6.1–8.1)

## 2018-02-12 LAB — HEPATITIS C GENOTYPE: HCV GENOTYPE: NOT DETECTED

## 2018-02-12 NOTE — Progress Notes (Signed)
APRI 1.165, FIB-4 2.52 in the setting of lower platelets >> both methods of non-invasive liver staging are within in determinant ranges and unable to be helpful to interpret fibrosis risk. Will await FibroTest to determine need to proceed with U/S.

## 2018-02-13 ENCOUNTER — Telehealth: Payer: Self-pay | Admitting: Infectious Diseases

## 2018-02-13 DIAGNOSIS — B182 Chronic viral hepatitis C: Secondary | ICD-10-CM

## 2018-02-13 NOTE — Telephone Encounter (Signed)
Called to discuss results of patient's tests with severe fibrosis/cirrhosis indicated on non-invasive testing. Will proceed with ultrasound to screen for [facility] and assess liver further. Reinforced need to reduce tylenol use as much as he can to < 2000 mg a day.   He has [employer] Card currently that expires 1/22 and will try to get this study completed by then. He is not on any statin or GERD treatment. Can treat with Epclusa x 12 weeks (genotype was not able to be generated but with pangenotypic options I am hoping submitting for treatment this will not be a problem.  Will discuss with our pharmacy team and scheduler.

## 2018-02-17 ENCOUNTER — Ambulatory Visit: Payer: Medicaid Other | Attending: Critical Care Medicine | Admitting: Critical Care Medicine

## 2018-02-17 ENCOUNTER — Encounter: Payer: Self-pay | Admitting: Critical Care Medicine

## 2018-02-17 VITALS — BP 103/60 | HR 86 | Temp 98.4°F | Resp 18 | Ht 62.5 in | Wt 126.0 lb

## 2018-02-17 DIAGNOSIS — R918 Other nonspecific abnormal finding of lung field: Secondary | ICD-10-CM

## 2018-02-17 DIAGNOSIS — Z7951 Long term (current) use of inhaled steroids: Secondary | ICD-10-CM | POA: Diagnosis not present

## 2018-02-17 DIAGNOSIS — Z79899 Other long term (current) drug therapy: Secondary | ICD-10-CM | POA: Diagnosis not present

## 2018-02-17 DIAGNOSIS — R200 Anesthesia of skin: Secondary | ICD-10-CM | POA: Diagnosis not present

## 2018-02-17 DIAGNOSIS — F17210 Nicotine dependence, cigarettes, uncomplicated: Secondary | ICD-10-CM | POA: Diagnosis not present

## 2018-02-17 DIAGNOSIS — R093 Abnormal sputum: Secondary | ICD-10-CM | POA: Diagnosis not present

## 2018-02-17 DIAGNOSIS — R0602 Shortness of breath: Secondary | ICD-10-CM | POA: Diagnosis not present

## 2018-02-17 DIAGNOSIS — J440 Chronic obstructive pulmonary disease with acute lower respiratory infection: Secondary | ICD-10-CM

## 2018-02-17 DIAGNOSIS — F17200 Nicotine dependence, unspecified, uncomplicated: Secondary | ICD-10-CM

## 2018-02-17 DIAGNOSIS — J209 Acute bronchitis, unspecified: Secondary | ICD-10-CM | POA: Diagnosis not present

## 2018-02-17 DIAGNOSIS — M545 Low back pain: Secondary | ICD-10-CM | POA: Insufficient documentation

## 2018-02-17 DIAGNOSIS — R062 Wheezing: Secondary | ICD-10-CM | POA: Diagnosis not present

## 2018-02-17 DIAGNOSIS — J432 Centrilobular emphysema: Secondary | ICD-10-CM | POA: Insufficient documentation

## 2018-02-17 MED ORDER — METHYLPREDNISOLONE SODIUM SUCC 125 MG IJ SOLR
125.0000 mg | Freq: Once | INTRAMUSCULAR | Status: AC
  Administered 2018-02-17: 125 mg via INTRAMUSCULAR

## 2018-02-17 MED ORDER — NICOTINE POLACRILEX 4 MG MT LOZG: 4.0000 mg | LOZENGE | OROMUCOSAL | 0 refills | Status: DC | PRN

## 2018-02-17 MED ORDER — FLUTTER DEVI: 0 refills | Status: AC

## 2018-02-17 MED ORDER — AZITHROMYCIN 250 MG PO TABS: ORAL_TABLET | ORAL | 0 refills | Status: DC

## 2018-02-17 NOTE — Assessment & Plan Note (Signed)
Chronic obstructive lung disease with primary emphysematous component and now superimposed acute tracheobronchitis  Centrilobular emphysema seen on CT of chest with active smoking use No recent pulmonary function studies  Plan Obtain overnight sleep oximetry study Continue Dulera 2 puffs twice daily Continue Incruse 1 puff daily Continue albuterol by [facility] or nebulization as needed Obtain flutter valve 4 times daily A Depo-Medrol injection 125 mg was given today in the office Azithromycin 250 mg to take 2 once and then 1 daily till gone was given Note during this office visit exertional oxygenation was normal Smoking cessation will be achieved with nicotine replacement therapy

## 2018-02-17 NOTE — Assessment & Plan Note (Signed)
Multiple pulmonary nodules which appear to be benign in nature but would benefit from repeat CT scanning in 6 months without contrast

## 2018-02-17 NOTE — Assessment & Plan Note (Signed)
Ongoing tobacco dependency 5 to 10 minutes of smoking cessation counseling was given to the patient and a prescription for nicotine replacement therapy 4 mg nicotine lozenge to use 3 times daily was given to the patient

## 2018-02-17 NOTE — Patient Instructions (Addendum)
Continue Dulera 2 puffs twice daily and Incruse 1 puff daily Continue to use your albuterol in either the inhaler or nebulizer 2-3 times daily as needed A Solu-Medrol 125 mg injection was given today in the office Begin azithromycin 250 mg take 2 the first day then 1 daily till you complete Stop smoking by using nicotine lozenge use 1 of these 3 times daily allow to dissolve in the mouth An overnight oxygen test will be obtained A lung function test will be obtained A flutter valve will be given to use 4 times daily Return in 1 month

## 2018-02-17 NOTE — Progress Notes (Signed)
Subjective:    Patient ID: Corey HazardDavid W Lawrence, male    DOB: 08/14/1955, 63 y.o.   MRN: [id number]  63 y.o.M with Copd for evaluation. Not on oxygen.  On neb med qid  Mechanic carpentry   Shortness of Breath  This is a chronic problem. The current episode started more than 1 year ago. The problem occurs constantly (any activity is dyspneic, walking or picking up an object ). The problem has been gradually worsening. Associated symptoms include PND, sputum production and wheezing. Pertinent negatives include no chest pain, fever, hemoptysis, leg swelling or orthopnea. The symptoms are aggravated by lying flat, weather changes, emotional upset, exercise and any activity. Associated symptoms comments: Left arm numbness, mucus is green yellow. Risk factors include smoking. He has tried steroid inhalers and beta agonist inhalers for the symptoms. His past medical history is significant for COPD and pneumonia. There is no history of CAD, a heart failure or PE.   Past Medical History:  Diagnosis Date  . Ankle fracture, left    "casted; no OR"  . Chronic lower back pain   . COPD (chronic obstructive pulmonary disease) (HCC)   . Tobacco abuse      Family History  Problem Relation Age of Onset  . Esophageal cancer Father   . Colon cancer Sister   . Cancer - Other Brother   . Heart disease Mother   . Diabetes Mellitus II Mother   . Heart disease Other        multiple maternal family members  . Diabetes Mellitus II Other        multiple maternal family members     Social History   Socioeconomic History  . Marital status: Divorced    Spouse name: Not on file  . Number of children: Not on file  . Years of education: Not on file  . Highest education level: Not on file  Occupational History  . Not on file  Social Needs  . Financial resource strain: Not on file  . Food insecurity:    Worry: Not on file    Inability: Not on file  . Transportation needs:    Medical: Not on file   Non-medical: Not on file  Tobacco Use  . Smoking status: Current Every Day Smoker    Packs/day: 0.25    Years: 43.00    Pack years: 10.75    Types: Cigarettes  . Smokeless tobacco: Never Used  . Tobacco comment: currently on patch  Substance and Sexual Activity  . 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 Drug use: Yes    Comment: 09/21/2013 "used marijuana once when I was 20; used cocaine  in ~ 2000; stopped in ~ 2003"  . Sexual activity: Not Currently  Lifestyle  . Physical activity:    Days per week: Not on file    Minutes per session: Not on file  . Stress: Not on file  Relationships  . Social connections:    Talks on phone: Not on file    Gets together: Not on file    Attends religious service: Not on file    Active member of club or organization: Not on file    Attends meetings of clubs or organizations: Not on file    Relationship status: Not on file  . Intimate partner violence:    Fear of current or ex partner: Not on file    Emotionally abused: Not  on file    Physically abused: Not on file    Forced sexual activity: Not on file  Other Topics Concern  . Not on file  Social History Narrative   Lives in trailer.  Not married.  5 children.       No Known Allergies   Outpatient Medications Prior to Visit  Medication Sig Dispense Refill  . acetaminophen (TYLENOL) 325 MG tablet Take 2 tablets (650 mg total) by mouth every 6 (six) hours as needed for mild pain (or Fever >/= 101).    Marland Kitchen albuterol (PROVENTIL HFA;VENTOLIN HFA) 108 (90 Base) MCG/ACT inhaler Inhale 1-2 puffs into the lungs every 6 (six) hours as needed for wheezing or shortness of breath. 2 Inhaler 6  . albuterol (PROVENTIL) (2.5 MG/3ML) 0.083% nebulizer solution Take 3 mLs (2.5 mg total) by nebulization every 6 (six) hours as needed for wheezing or shortness of breath. 150 mL 6  . diphenhydramine-acetaminophen (TYLENOL PM) 25-500 MG TABS tablet Take  1 tablet by mouth at bedtime as needed.    . mometasone-formoterol (DULERA) 100-5 MCG/ACT AERO Inhale 2 puffs into the lungs daily. 2 Inhaler 6  . Multiple Vitamins-Minerals (MULTIVITAMIN ADULTS 50+) TABS Take 1 tablet by mouth daily.    . traZODone (DESYREL) 50 MG tablet Take 0.5-1 tablets (25-50 mg total) by mouth at bedtime as needed for sleep. 30 tablet 6  . umeclidinium bromide (INCRUSE ELLIPTA) 62.5 MCG/INH AEPB Inhale 1 puff into the lungs daily. 1 each 12  . methocarbamol (ROBAXIN) 500 MG tablet Take 1 tablet (500 mg total) by mouth 2 (two) times daily as needed for muscle spasms. (Patient not taking: Reported on 02/17/2018) 30 tablet 0  . traMADol (ULTRAM) 50 MG tablet Take 1 tablet (50 mg total) by mouth every 12 (twelve) hours as needed. (Patient not taking: Reported on 02/17/2018) 60 tablet 0  . predniSONE (DELTASONE) 20 MG tablet 2 tabs po daily x 3 days, then 1 tabs x 3 days, then 1/2 tabs x 4 days, 11 tablet 0   No facility-administered medications prior to visit.       Review of Systems  Constitutional: Negative for fever.  Respiratory: Positive for sputum production, shortness of breath and wheezing. Negative for hemoptysis.   Cardiovascular: Positive for PND. Negative for chest pain, orthopnea and leg swelling.       Objective:   Physical Exam Vitals:   02/17/18 0914 02/17/18 0932  BP: 103/60   Pulse: 71 86  Resp: 18   Temp: 98.4 F (36.9 C)   TempSrc: Oral   SpO2: 96% 98%  Weight: 126 lb (57.2 kg)   Height: 5' 2.5" (1.588 m)     Gen: Pleasant, thin  in no distress,  normal affect  ENT: No lesions,  mouth clear,  oropharynx clear, no postnasal drip  Neck: No JVD, no TMG, no carotid bruits  Lungs: No use of accessory muscles, no dullness to percussion, scattered rhonchi and expired wheezes with poor breath sounds   cardiovascular: RRR, heart sounds normal, no murmur or gallops, no peripheral edema  Abdomen: soft and NT, no HSM,  BS  normal  Musculoskeletal: No deformities, no cyanosis or clubbing  Neuro: alert, non focal  Skin: Warm, no lesions or rashes  CT of the chest was reviewed from August 2019 and showed scattered pulmonary nodules appear to be benign in nature  No PFTs available  Amb pulse ox : no desats.  Sp02 96% was lowest value     Assessment &  Plan:  I personally reviewed all images and lab data in the [facility] as well as any outside material available during this office visit and agree with the  radiology impressions.   COPD with acute bronchitis (HCC) Chronic obstructive lung disease with primary emphysematous component and now superimposed acute tracheobronchitis  Centrilobular emphysema seen on CT of chest with active smoking use No recent pulmonary function studies  Plan Obtain overnight sleep oximetry study Continue Dulera 2 puffs twice daily Continue Incruse 1 puff daily Continue albuterol by [facility] or nebulization as needed Obtain flutter valve 4 times daily A Depo-Medrol injection 125 mg was given today in the office Azithromycin 250 mg to take 2 once and then 1 daily till gone was given Note during this office visit exertional oxygenation was normal Smoking cessation will be achieved with nicotine replacement therapy  Lung nodules Multiple pulmonary nodules which appear to be benign in nature but would benefit from repeat CT scanning in 6 months without contrast  Tobacco dependence Ongoing tobacco dependency 5 to 10 minutes of smoking cessation counseling was given to the patient and a prescription for nicotine replacement therapy 4 mg nicotine lozenge to use 3 times daily was given to the patient   Corey Lawrence was seen today for follow-up.  Diagnoses and all orders for this visit:  COPD with acute bronchitis (HCC) -     Flutter valve; Future -     Pulmonary Function Test; Future -     Pulse oximetry, overnight; Future -     methylPREDNISolone sodium succinate (SOLU-MEDROL) 125  mg/2 mL injection 125 mg  Centrilobular emphysema (HCC) -     Flutter valve; Future -     Pulmonary Function Test; Future -     Pulse oximetry, overnight; Future  Lung nodules  Tobacco dependence  Other orders -     azithromycin (ZITHROMAX) 250 MG tablet; Take two once then one daily until gone -     Respiratory Therapy Supplies (FLUTTER) DEVI; Use 4 times daily -     nicotine polacrilex (NICOTINE MINI) 4 MG lozenge; Take 1 lozenge (4 mg total) by mouth as needed for smoking cessation.    I had an extended discussion with the patient and or family lasting 8 minutes of a 25 minute visit including: Smoking cessation counseling

## 2018-02-18 NOTE — Telephone Encounter (Signed)
Sounds good - want to wait for elastography or proceed with Epclusa?

## 2018-02-19 NOTE — Telephone Encounter (Signed)
I would like to wait on Elastography if we can to get a true pre-treatment baseline.   Corey Lawrence will you please reach out to Corey Lawrence to schedule? He said we can leave a message on his listed # (verified correct number at last phone encounter). His minutes may be up on his phone but as soon as he is able he will check his voicemail.   Thank you both

## 2018-02-20 ENCOUNTER — Telehealth: Payer: Self-pay

## 2018-02-20 NOTE — Telephone Encounter (Signed)
Call placed to the patient to inform him the cost of the flutter device as he does not have any insurance. He would need to pay out of pocket.  Explained that the cost from the local DME company is $81.  He said that he does not have money for the device.  Explained to him that [facility] is looking into purchasing one for him

## 2018-02-23 ENCOUNTER — Ambulatory Visit (HOSPITAL_COMMUNITY): Admission: RE | Admit: 2018-02-23 | Payer: Self-pay | Source: Ambulatory Visit

## 2018-02-24 ENCOUNTER — Encounter (HOSPITAL_COMMUNITY): Payer: Self-pay

## 2018-02-24 ENCOUNTER — Ambulatory Visit (HOSPITAL_COMMUNITY)
Admission: RE | Admit: 2018-02-24 | Discharge: 2018-02-24 | Disposition: A | Discharge status: Home / Self Care | Payer: Medicaid Other | Source: Ambulatory Visit | Attending: Critical Care Medicine | Admitting: Critical Care Medicine

## 2018-02-24 DIAGNOSIS — J432 Centrilobular emphysema: Secondary | ICD-10-CM | POA: Insufficient documentation

## 2018-02-24 DIAGNOSIS — J209 Acute bronchitis, unspecified: Secondary | ICD-10-CM | POA: Diagnosis present

## 2018-02-24 DIAGNOSIS — J440 Chronic obstructive pulmonary disease with acute lower respiratory infection: Secondary | ICD-10-CM | POA: Diagnosis present

## 2018-02-24 LAB — PULMONARY FUNCTION TEST
DL/VA % pred: 54 %
DL/VA: 2.23 ml/min/mmHg/L
DLCO unc % pred: 53 %
DLCO unc: 12.22 ml/min/mmHg
FEF 25-75 PRE: 0.5 L/s
FEF 25-75 Post: 0.5 L/sec
FEF2575-%Change-Post: 0 %
FEF2575-%PRED-PRE: 21 %
FEF2575-%Pred-Post: 21 %
FEV1-%Change-Post: 0 %
FEV1-%PRED-POST: 50 %
FEV1-%PRED-PRE: 49 %
FEV1-PRE: 1.36 L
FEV1-Post: 1.37 L
FEV1FVC-%CHANGE-POST: 2 %
FEV1FVC-%Pred-Pre: 61 %
FEV6-%CHANGE-POST: 5 %
FEV6-%PRED-POST: 79 %
FEV6-%Pred-Pre: 75 %
FEV6-PRE: 2.58 L
FEV6-Post: 2.73 L
FEV6FVC-%CHANGE-POST: 4 %
FEV6FVC-%PRED-PRE: 96 %
FEV6FVC-%Pred-Post: 100 %
FVC-%Change-Post: -1 %
FVC-%Pred-Post: 79 %
FVC-%Pred-Pre: 80 %
FVC-POST: 2.87 L
FVC-Pre: 2.92 L
POST FEV1/FVC RATIO: 48 %
PRE FEV1/FVC RATIO: 47 %
Post FEV6/FVC ratio: 95 %
Pre FEV6/FVC Ratio: 92 %
RV % pred: 256 %
RV: 4.82 L
TLC % PRED: 144 %
TLC: 8.08 L

## 2018-02-24 MED ORDER — ALBUTEROL SULFATE (2.5 MG/3ML) 0.083% IN NEBU
2.5000 mg | INHALATION_SOLUTION | Freq: Once | RESPIRATORY_TRACT | Status: AC
  Administered 2018-02-24: 2.5 mg via RESPIRATORY_TRACT

## 2018-03-01 ENCOUNTER — Telehealth: Payer: Self-pay | Admitting: Critical Care Medicine

## 2018-03-01 DIAGNOSIS — J209 Acute bronchitis, unspecified: Secondary | ICD-10-CM

## 2018-03-01 DIAGNOSIS — J440 Chronic obstructive pulmonary disease with acute lower respiratory infection: Principal | ICD-10-CM

## 2018-03-01 NOTE — Telephone Encounter (Signed)
I called the patient and gave him PFTs results.     Severe COPD with Golds stage C  With no change after BDs  Referral to pulm rehab was made

## 2018-03-13 ENCOUNTER — Other Ambulatory Visit: Payer: Self-pay

## 2018-03-13 DIAGNOSIS — J439 Emphysema, unspecified: Secondary | ICD-10-CM

## 2018-03-13 MED ORDER — ALBUTEROL SULFATE (2.5 MG/3ML) 0.083% IN NEBU: 2.5000 mg | INHALATION_SOLUTION | Freq: Four times a day (QID) | RESPIRATORY_TRACT | 0 refills | Status: DC | PRN

## 2018-03-13 MED ORDER — ALBUTEROL SULFATE HFA 108 (90 BASE) MCG/ACT IN AERS: 1.0000 | INHALATION_SPRAY | Freq: Four times a day (QID) | RESPIRATORY_TRACT | 0 refills | Status: DC | PRN

## 2018-03-16 ENCOUNTER — Telehealth: Payer: Self-pay | Admitting: Internal Medicine

## 2018-03-16 MED ORDER — CEFDINIR 300 MG PO CAPS: 300.0000 mg | ORAL_CAPSULE | Freq: Two times a day (BID) | ORAL | 0 refills | Status: AC

## 2018-03-16 MED ORDER — PREDNISONE 10 MG PO TABS: ORAL_TABLET | ORAL | 0 refills | Status: DC

## 2018-03-16 MED FILL — !VENTOLIN HFA INHALER: 108 (90 BAS | 50 days supply | Qty: 36 | Fill #0 | Status: TO

## 2018-03-16 MED FILL — CEFDINIR 300 MG CAPSULE: 300 | 7 days supply | Qty: 14 | Fill #0

## 2018-03-16 MED FILL — ?PREDNISONE 10 MG TABLET: 10 | 5 days supply | Qty: 20 | Fill #0

## 2018-03-16 MED FILL — AZITHROMYCIN 250 MG TABLET: 250 | 5 days supply | Qty: 6 | Fill #0

## 2018-03-16 MED FILL — ALBUTEROL SUL 2.5 MG/3 ML S: (2.5 MG/3ML | 22 days supply | Qty: 90 | Fill #0 | Status: TO

## 2018-03-16 NOTE — Telephone Encounter (Signed)
Dr. Delford Field pt is wanting to know if you will send prednisone as well?

## 2018-03-16 NOTE — Telephone Encounter (Signed)
I sent cefdnir 300mg  take one twice daily for 7days to our pharmacy.  Needs f/u OV with me soon in next week

## 2018-03-16 NOTE — Telephone Encounter (Signed)
Contacted pt and left a detailed vm informing pt that Dr. Delford Field sent prednisone to the pharmacy as well and see if he picked them up asked pt to give me a call at his earliest convenience

## 2018-03-16 NOTE — Telephone Encounter (Signed)
Will forward to Dr. Wright  

## 2018-03-16 NOTE — Telephone Encounter (Signed)
Yes I sent prednisone as well

## 2018-03-16 NOTE — Telephone Encounter (Signed)
Patient came in to the facility stating he has pneumonia and Dr. Delford Field had prescribed him some antibiotics. Please f/u

## 2018-03-17 ENCOUNTER — Telehealth: Payer: Self-pay

## 2018-03-17 NOTE — Telephone Encounter (Signed)
Call placed to patient and informed him that the flutter device has arrived.  He said that he will pick it up when he comes to his appointment on 03/25/2018.

## 2018-03-25 ENCOUNTER — Ambulatory Visit: Payer: Self-pay | Admitting: Critical Care Medicine

## 2018-04-07 ENCOUNTER — Ambulatory Visit: Payer: Self-pay | Admitting: Critical Care Medicine

## 2018-04-07 NOTE — Progress Notes (Deleted)
Subjective:    Patient ID: Corey Lawrence, male    DOB: 1955-11-21, 63 y.o.   MRN: [id number]  63 y.o.M with Copd for evaluation. Not on oxygen.  On neb med qid  Mechanic carpentry   Shortness of Breath  This is a chronic problem. The current episode started more than 1 year ago. The problem occurs constantly (any activity is dyspneic, walking or picking up an object ). The problem has been gradually worsening. Associated symptoms include PND, sputum production and wheezing. Pertinent negatives include no chest pain, fever, hemoptysis, leg swelling or orthopnea. The symptoms are aggravated by lying flat, weather changes, emotional upset, exercise and any activity. Associated symptoms comments: Left arm numbness, mucus is green yellow. Risk factors include smoking. He has tried steroid inhalers and beta agonist inhalers for the symptoms. His past medical history is significant for COPD and pneumonia. There is no history of CAD, a heart failure or PE.   Past Medical History:  Diagnosis Date  . Ankle fracture, left    "casted; no OR"  . Chronic lower back pain   . COPD (chronic obstructive pulmonary disease) (HCC)   . Tobacco abuse      Family History  Problem Relation Age of Onset  . Esophageal cancer Father   . Colon cancer Sister   . Cancer - Other Brother   . Heart disease Mother   . Diabetes Mellitus II Mother   . Heart disease Other        multiple maternal family members  . Diabetes Mellitus II Other        multiple maternal family members     Social History   Socioeconomic History  . Marital status: Divorced    Spouse name: Not on file  . Number of children: Not on file  . Years of education: Not on file  . Highest education level: Not on file  Occupational History  . Not on file  Social Needs  . Financial resource strain: Not on file  . Food insecurity:    Worry: Not on file    Inability: Not on file  . Transportation needs:    Medical: Not on file   Non-medical: Not on file  Tobacco Use  . Smoking status: Current Every Day Smoker    Packs/day: 0.25    Years: 43.00    Pack years: 10.75    Types: Cigarettes  . Smokeless tobacco: Never Used  . Tobacco comment: currently on patch  Substance and Sexual Activity  . 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 Drug use: Yes    Comment: 09/21/2013 "used marijuana once when I was 20; used cocaine  in ~ 2000; stopped in ~ 2003"  . Sexual activity: Not Currently  Lifestyle  . Physical activity:    Days per week: Not on file    Minutes per session: Not on file  . Stress: Not on file  Relationships  . Social connections:    Talks on phone: Not on file    Gets together: Not on file    Attends religious service: Not on file    Active member of club or organization: Not on file    Attends meetings of clubs or organizations: Not on file    Relationship status: Not on file  . Intimate partner violence:    Fear of current or ex partner: Not on file    Emotionally abused: Not  on file    Physically abused: Not on file    Forced sexual activity: Not on file  Other Topics Concern  . Not on file  Social History Narrative   Lives in trailer.  Not married.  5 children.       No Known Allergies   Outpatient Medications Prior to Visit  Medication Sig Dispense Refill  . acetaminophen (TYLENOL) 325 MG tablet Take 2 tablets (650 mg total) by mouth every 6 (six) hours as needed for mild pain (or Fever >/= 101).    Marland Kitchen albuterol (PROVENTIL HFA;VENTOLIN HFA) 108 (90 Base) MCG/ACT inhaler Inhale 1-2 puffs into the lungs every 6 (six) hours as needed for wheezing or shortness of breath. 2 Inhaler 0  . albuterol (PROVENTIL) (2.5 MG/3ML) 0.083% nebulizer solution Take 3 mLs (2.5 mg total) by nebulization every 6 (six) hours as needed for wheezing or shortness of breath. 150 mL 0  . diphenhydramine-acetaminophen (TYLENOL PM) 25-500 MG TABS tablet Take  1 tablet by mouth at bedtime as needed.    . methocarbamol (ROBAXIN) 500 MG tablet Take 1 tablet (500 mg total) by mouth 2 (two) times daily as needed for muscle spasms. (Patient not taking: Reported on 02/17/2018) 30 tablet 0  . mometasone-formoterol (DULERA) 100-5 MCG/ACT AERO Inhale 2 puffs into the lungs daily. 2 Inhaler 6  . Multiple Vitamins-Minerals (MULTIVITAMIN ADULTS 50+) TABS Take 1 tablet by mouth daily.    . nicotine polacrilex (NICOTINE MINI) 4 MG lozenge Take 1 lozenge (4 mg total) by mouth as needed for smoking cessation. 100 tablet 0  . predniSONE (DELTASONE) 10 MG tablet Take 4 tablets daily for 5 days then stop 20 tablet 0  . Respiratory Therapy Supplies (FLUTTER) DEVI Use 4 times daily 1 each 0  . traMADol (ULTRAM) 50 MG tablet Take 1 tablet (50 mg total) by mouth every 12 (twelve) hours as needed. (Patient not taking: Reported on 02/17/2018) 60 tablet 0  . traZODone (DESYREL) 50 MG tablet Take 0.5-1 tablets (25-50 mg total) by mouth at bedtime as needed for sleep. 30 tablet 6  . umeclidinium bromide (INCRUSE ELLIPTA) 62.5 MCG/INH AEPB Inhale 1 puff into the lungs daily. 1 each 12   No facility-administered medications prior to visit.       Review of Systems  Constitutional: Negative for fever.  Respiratory: Positive for sputum production, shortness of breath and wheezing. Negative for hemoptysis.   Cardiovascular: Positive for PND. Negative for chest pain, orthopnea and leg swelling.       Objective:   Physical Exam There were no vitals filed for this visit.  Gen: Pleasant, thin  in no distress,  normal affect  ENT: No lesions,  mouth clear,  oropharynx clear, no postnasal drip  Neck: No JVD, no TMG, no carotid bruits  Lungs: No use of accessory muscles, no dullness to percussion, scattered rhonchi and expired wheezes with poor breath sounds   cardiovascular: RRR, heart sounds normal, no murmur or gallops, no peripheral edema  Abdomen: soft and NT, no HSM,  BS  normal  Musculoskeletal: No deformities, no cyanosis or clubbing  Neuro: alert, non focal  Skin: Warm, no lesions or rashes  CT of the chest was reviewed from August 2019 and showed scattered pulmonary nodules appear to be benign in nature  No PFTs available  Amb pulse ox : no desats.  Sp02 96% was lowest value     Assessment & Plan:  I personally reviewed all images and lab data in the [facility]  system as well as any outside material available during this office visit and agree with the  radiology impressions.   No problem-specific Assessment & Plan notes found for this encounter.   There are no diagnoses linked to this encounter.  I had an extended discussion with the patient and or family lasting 8 minutes of a 25 minute visit including: Smoking cessation counseling

## 2018-04-08 ENCOUNTER — Telehealth: Payer: Self-pay | Admitting: *Deleted

## 2018-04-08 NOTE — Telephone Encounter (Signed)
Patient no showed for their most recent appointment 04/07/2018. Please ask if there were any barriers causing the no show and offer the next available appointment.   

## 2018-04-21 ENCOUNTER — Telehealth: Payer: Self-pay | Admitting: Internal Medicine

## 2018-04-21 DIAGNOSIS — J439 Emphysema, unspecified: Secondary | ICD-10-CM

## 2018-04-21 NOTE — Telephone Encounter (Signed)
1) Medication(s) Requested (by name): albuterol (PROVENTIL) (2.5 MG/3ML) 0.083% nebulizer solution  albuterol (PROVENTIL HFA;VENTOLIN HFA) 108 (90 Base)  2) Pharmacy of Choice: [facility]  3) Special Requests:   Approved medications will be sent to the pharmacy, we will reach out if there is an issue.  Requests made after 3pm may not be addressed until the following business day!  If a patient is unsure of the name of the medication(s) please note and ask patient to call back when they are able to provide all info, do not send to responsible party until all information is available!

## 2018-04-22 MED ORDER — ALBUTEROL SULFATE (2.5 MG/3ML) 0.083% IN NEBU: 2.5000 mg | INHALATION_SOLUTION | Freq: Four times a day (QID) | RESPIRATORY_TRACT | 0 refills | Status: DC | PRN

## 2018-04-22 MED ORDER — ALBUTEROL SULFATE HFA 108 (90 BASE) MCG/ACT IN AERS: 1.0000 | INHALATION_SPRAY | Freq: Four times a day (QID) | RESPIRATORY_TRACT | 0 refills | Status: DC | PRN

## 2018-04-22 MED FILL — ALBUTEROL SUL 2.5 MG/3 ML S: (2.5 MG/3ML | 12 days supply | Qty: 150 | Fill #0

## 2018-04-24 ENCOUNTER — Other Ambulatory Visit: Payer: Self-pay

## 2018-04-24 ENCOUNTER — Encounter: Payer: Self-pay | Admitting: Internal Medicine

## 2018-04-24 ENCOUNTER — Ambulatory Visit: Payer: Medicaid Other | Attending: Internal Medicine | Admitting: Internal Medicine

## 2018-04-24 VITALS — BP 128/72 | HR 84 | Temp 98.0°F | Resp 16 | Wt 122.6 lb

## 2018-04-24 DIAGNOSIS — F17200 Nicotine dependence, unspecified, uncomplicated: Secondary | ICD-10-CM

## 2018-04-24 DIAGNOSIS — Z833 Family history of diabetes mellitus: Secondary | ICD-10-CM | POA: Insufficient documentation

## 2018-04-24 DIAGNOSIS — J209 Acute bronchitis, unspecified: Secondary | ICD-10-CM | POA: Insufficient documentation

## 2018-04-24 DIAGNOSIS — Z79899 Other long term (current) drug therapy: Secondary | ICD-10-CM | POA: Diagnosis not present

## 2018-04-24 DIAGNOSIS — F17210 Nicotine dependence, cigarettes, uncomplicated: Secondary | ICD-10-CM | POA: Diagnosis not present

## 2018-04-24 DIAGNOSIS — Z1211 Encounter for screening for malignant neoplasm of colon: Secondary | ICD-10-CM

## 2018-04-24 DIAGNOSIS — J440 Chronic obstructive pulmonary disease with acute lower respiratory infection: Secondary | ICD-10-CM | POA: Diagnosis not present

## 2018-04-24 DIAGNOSIS — R918 Other nonspecific abnormal finding of lung field: Secondary | ICD-10-CM

## 2018-04-24 DIAGNOSIS — Z800 Family history of malignant neoplasm of digestive organs: Secondary | ICD-10-CM | POA: Insufficient documentation

## 2018-04-24 DIAGNOSIS — Z808 Family history of malignant neoplasm of other organs or systems: Secondary | ICD-10-CM | POA: Insufficient documentation

## 2018-04-24 DIAGNOSIS — R7303 Prediabetes: Secondary | ICD-10-CM | POA: Insufficient documentation

## 2018-04-24 DIAGNOSIS — J432 Centrilobular emphysema: Secondary | ICD-10-CM | POA: Diagnosis not present

## 2018-04-24 DIAGNOSIS — B182 Chronic viral hepatitis C: Secondary | ICD-10-CM

## 2018-04-24 MED ORDER — AZITHROMYCIN 250 MG PO TABS: ORAL_TABLET | ORAL | 0 refills | Status: DC

## 2018-04-24 MED ORDER — UMECLIDINIUM BROMIDE 62.5 MCG/INH IN AEPB: 1.0000 | INHALATION_SPRAY | Freq: Every day | RESPIRATORY_TRACT | 12 refills | Status: DC

## 2018-04-24 MED ORDER — MOMETASONE FURO-FORMOTEROL FUM 100-5 MCG/ACT IN AERO: 2.0000 | INHALATION_SPRAY | Freq: Every day | RESPIRATORY_TRACT | 6 refills | Status: DC

## 2018-04-24 MED ORDER — NICOTINE POLACRILEX 2 MG MT LOZG: 2.0000 mg | LOZENGE | OROMUCOSAL | 1 refills | Status: DC | PRN

## 2018-04-24 MED ORDER — ALBUTEROL SULFATE (2.5 MG/3ML) 0.083% IN NEBU: 2.5000 mg | INHALATION_SOLUTION | Freq: Four times a day (QID) | RESPIRATORY_TRACT | 6 refills | Status: DC | PRN

## 2018-04-24 MED ORDER — PREDNISONE 10 MG PO TABS: ORAL_TABLET | ORAL | 0 refills | Status: DC

## 2018-04-24 MED ORDER — ALBUTEROL SULFATE HFA 108 (90 BASE) MCG/ACT IN AERS: 1.0000 | INHALATION_SPRAY | Freq: Four times a day (QID) | RESPIRATORY_TRACT | 0 refills | Status: DC | PRN

## 2018-04-24 MED FILL — !PROVENTIL HFA 90 MCG INH: 108 (90 BAS | 20 days supply | Qty: 1 | Fill #0

## 2018-04-24 MED FILL — predniSONE 10 MG TABS: 10 | 5 days supply | Qty: 20 | Fill #0

## 2018-04-24 NOTE — Progress Notes (Signed)
Patient ID: Corey Lawrence, male    DOB: [birth year]  MRN: [id number]  CC: COPD   Subjective: Corey Lawrence is a 63 y.o. male who presents for chronic ds management. His concerns today include:  COPD,lung nodules, Hep C,tob dep, chronic LBP and anxiety disorder.  Pt now has Medicaid. He was denied disability in 2015.  Plans to reapply.  COPD/lung nodules:  Saw Dr. Delford Field.  He had PFTs that showed severe COPD with gold stage C.  Referred to pulmonary rehab but patient has not received a call about this as yet.  He would like for me to resubmit the referral.  He plans to use medical transportation to get there. Out of Dulera x few wks. compliant with Incruse inhaler using his nebulizer treatment twice a day.  Still smoking.  Was unable to afford the nicotine lozenges but now that he has Medicaid he would like to give it a try. -Having somewhat of a flare today with increased wheezing and cough productive of white to yellow phlegm.  He is requesting refill on prednisone and Zithromax  Hep C:  Saw infectious disease since last visit with me.  They had ordered an elastography/ultrasound of the liver.  He did not have it done as yet.  He is wanting to know when he will be offered treatment.  Patient Active Problem List   Diagnosis Date Noted  . Urinary hesitancy 02/09/2018  . Lung nodules 09/12/2017  . Tobacco dependence 09/12/2017  . Hep C w/o coma, chronic (HCC) 09/12/2017  . Generalized anxiety disorder 05/22/2016  . Prediabetes 09/29/2013  . Chronic low back pain 09/29/2013  . COPD with acute bronchitis (HCC) 09/21/2013     Current Outpatient Medications on File Prior to Visit  Medication Sig Dispense Refill  . acetaminophen (TYLENOL) 325 MG tablet Take 2 tablets (650 mg total) by mouth every 6 (six) hours as needed for mild pain (or Fever >/= 101).    . diphenhydramine-acetaminophen (TYLENOL PM) 25-500 MG TABS tablet Take 1 tablet by mouth at bedtime as needed.    . Multiple  Vitamins-Minerals (MULTIVITAMIN ADULTS 50+) TABS Take 1 tablet by mouth daily.    Marland Kitchen Respiratory Therapy Supplies (FLUTTER) DEVI Use 4 times daily 1 each 0  . traZODone (DESYREL) 50 MG tablet Take 0.5-1 tablets (25-50 mg total) by mouth at bedtime as needed for sleep. 30 tablet 6   No current facility-administered medications on file prior to visit.     No Known Allergies  Social History   Socioeconomic History  . Marital status: Divorced    Spouse name: Not on file  . Number of children: Not on file  . Years of education: Not on file  . Highest education level: Not on file  Occupational History  . Not on file  Social Needs  . Financial resource strain: Not on file  . Food insecurity:    Worry: Not on file    Inability: Not on file  . Transportation needs:    Medical: Not on file    Non-medical: Not on file  Tobacco Use  . Smoking status: Current Every Day Smoker    Packs/day: 0.25    Years: 43.00    Pack years: 10.75    Types: Cigarettes  . Smokeless tobacco: Never Used  . Tobacco comment: currently on patch  Substance and Sexual Activity  . 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 Drug  use: Yes    Comment: 09/21/2013 "used marijuana once when I was 20; used cocaine  in ~ 2000; stopped in ~ 2003"  . Sexual activity: Not Currently  Lifestyle  . Physical activity:    Days per week: Not on file    Minutes per session: Not on file  . Stress: Not on file  Relationships  . Social connections:    Talks on phone: Not on file    Gets together: Not on file    Attends religious service: Not on file    Active member of club or organization: Not on file    Attends meetings of clubs or organizations: Not on file    Relationship status: Not on file  . Intimate partner violence:    Fear of current or ex partner: Not on file    Emotionally abused: Not on file    Physically abused: Not on file    Forced sexual  activity: Not on file  Other Topics Concern  . Not on file  Social History Narrative   Lives in trailer.  Not married.  5 children.      Family History  Problem Relation Age of Onset  . Esophageal cancer Father   . Colon cancer Sister   . Cancer - Other Brother   . Heart disease Mother   . Diabetes Mellitus II Mother   . Heart disease Other        multiple maternal family members  . Diabetes Mellitus II Other        multiple maternal family members    Past Surgical History:  Procedure Laterality Date  . INCISION AND DRAINAGE OF WOUND Left ~ 1977   "splinter got infected between" digits 2 & 3    ROS: Review of Systems Negative except as stated above  PHYSICAL EXAM: BP 128/72   Pulse 84   Temp 98 F (36.7 C) (Oral)   Resp 16   Wt 122 lb 9.6 oz (55.6 kg)   SpO2 97%   BMI 22.07 kg/m   Physical Exam  General appearance - alert, older Caucasian male who is in NAD.   Mental status - normal mood, behavior, speech, dress, motor activity, and thought processes Chest -bilateral mild diffuse wheezing.  Breath sounds are decreased.   Heart - normal rate, regular rhythm, normal S1, S2, no murmurs, rubs, clicks or gallops Extremities -no lower extremity edema.  CMP Latest Ref Rng & Units 02/09/2018 02/09/2018 07/22/2017  Glucose 65 - 99 mg/dL 88 - 811(B)  BUN 7 - 25 mg/dL 10 - 12  Creatinine 1.47 - 1.25 mg/dL 8.29 - 5.62  Sodium 130 - 146 mmol/L 139 - 141  Potassium 3.5 - 5.3 mmol/L 3.7 - 4.4  Chloride 98 - 110 mmol/L 103 - 101  CO2 20 - 32 mmol/L 28 - 23  Calcium 8.6 - 10.3 mg/dL 9.7 - 86.5  Total Protein 6.1 - 8.1 g/dL 7.0 - 7.2  Total Bilirubin 0.2 - 1.2 mg/dL 0.6 - 0.3  Alkaline Phos 39 - 117 IU/L - - 114  AST 10 - 35 U/L 64(H) - 61(H)  ALT 9 - 46 U/L 57(H) 60(H) 97(H)   Lipid Panel     Component Value Date/Time   CHOL 153 07/22/2017 1040   TRIG 76 07/22/2017 1040   HDL 37 (L) 07/22/2017 1040   CHOLHDL 4.1 07/22/2017 1040   CHOLHDL 3.6 12/05/2014 1214   VLDL 22  12/05/2014 1214   LDLCALC 101 (H) 07/22/2017 1040  CBC    Component Value Date/Time   WBC 4.5 02/09/2018 1026   RBC 4.36 02/09/2018 1026   HGB 14.3 02/09/2018 1026   HGB 14.7 07/22/2017 1040   HCT 41.5 02/09/2018 1026   HCT 43.4 07/22/2017 1040   PLT 157 02/09/2018 1026   PLT 252 07/22/2017 1040   MCV 95.2 02/09/2018 1026   MCV 94 07/22/2017 1040   MCH 32.8 02/09/2018 1026   MCHC 34.5 02/09/2018 1026   RDW 12.0 02/09/2018 1026   RDW 14.0 07/22/2017 1040   LYMPHSABS 0.8 04/04/2017 0739   LYMPHSABS 2.4 08/14/2016 1630   MONOABS 1.0 04/04/2017 0739   EOSABS 0.1 04/04/2017 0739   EOSABS 0.7 (H) 08/14/2016 1630   BASOSABS 0.0 04/04/2017 0739   BASOSABS 0.1 08/14/2016 1630    ASSESSMENT AND PLAN: 1. Centrilobular emphysema (HCC) 2. COPD with acute bronchitis (HCC) I have submitted the referral for pulmonary rehab. - predniSONE (DELTASONE) 10 MG tablet; Take 4 tablets daily for 5 days then stop  Dispense: 20 tablet; Refill: 0 - albuterol (PROVENTIL HFA;VENTOLIN HFA) 108 (90 Base) MCG/ACT inhaler; Inhale 1-2 puffs into the lungs every 6 (six) hours as needed for wheezing or shortness of breath.  Dispense: 2 Inhaler; Refill: 0 - albuterol (PROVENTIL) (2.5 MG/3ML) 0.083% nebulizer solution; Take 3 mLs (2.5 mg total) by nebulization every 6 (six) hours as needed for wheezing or shortness of breath.  Dispense: 150 mL; Refill: 6 - mometasone-formoterol (DULERA) 100-5 MCG/ACT AERO; Inhale 2 puffs into the lungs daily.  Dispense: 2 Inhaler; Refill: 6 - umeclidinium bromide (INCRUSE ELLIPTA) 62.5 MCG/INH AEPB; Inhale 1 puff into the lungs daily.  Dispense: 1 each; Refill: 12 - azithromycin (ZITHROMAX) 250 MG tablet; 2 tabs PO x1 day then 1 tab PO daily.  Dispense: 6 tablet; Refill: 0  3. Lung nodules We will plan to repeat noncontrast CT on his next visit in 3 months as the hospital now has restrictions on patients coming for routine imaging follow-up due to the cold and virus outbreak   4. Tobacco dependence Strongly encouraged smoking cessation again.  He would like to try the lozenges now that he has Medicaid.  Prescription sent to the pharmacy.  Less than 5 minutes spent on counseling. - nicotine polacrilex (COMMIT) 2 MG lozenge; Take 1 lozenge (2 mg total) by mouth as needed for smoking cessation.  Dispense: 50 tablet; Refill: 1  5. Hep C w/o coma, chronic (HCC) Advised patient to follow-up with his ID provider to inquire about whether he meets criteria for treatment.  However he needs to get the elastography done first that was ordered  6. Screening for colon cancer Discussed colon cancer screening.  Patient is considering having colonoscopy but states he would like to hold off and discuss on next visit   Patient was given the opportunity to ask questions.  Patient verbalized understanding of the plan and was able to repeat key elements of the plan.   Orders Placed This Encounter  Procedures  . Pulmonary rehab therapeutic exercise     Requested Prescriptions   Signed Prescriptions Disp Refills  . predniSONE (DELTASONE) 10 MG tablet 20 tablet 0    Sig: Take 4 tablets daily for 5 days then stop  . albuterol (PROVENTIL HFA;VENTOLIN HFA) 108 (90 Base) MCG/ACT inhaler 2 Inhaler 0    Sig: Inhale 1-2 puffs into the lungs every 6 (six) hours as needed for wheezing or shortness of breath.  Marland Kitchen albuterol (PROVENTIL) (2.5 MG/3ML) 0.083% nebulizer solution 150 mL  6    Sig: Take 3 mLs (2.5 mg total) by nebulization every 6 (six) hours as needed for wheezing or shortness of breath.  . mometasone-formoterol (DULERA) 100-5 MCG/ACT AERO 2 Inhaler 6    Sig: Inhale 2 puffs into the lungs daily.  Marland Kitchen. umeclidinium bromide (INCRUSE ELLIPTA) 62.5 MCG/INH AEPB 1 each 12    Sig: Inhale 1 puff into the lungs daily.  . nicotine polacrilex (COMMIT) 2 MG lozenge 50 tablet 1    Sig: Take 1 lozenge (2 mg total) by mouth as needed for smoking cessation.  Marland Kitchen. azithromycin (ZITHROMAX) 250 MG tablet  6 tablet 0    Sig: 2 tabs PO x1 day then 1 tab PO daily.    Return in about 3 months (around 07/25/2018).  Jonah Blueeborah Nolah Krenzer, MD, FACP

## 2018-04-27 MED FILL — AZITHROMYCIN 250 MG TABLET: 250 | 5 days supply | Qty: 6 | Fill #0

## 2018-04-27 MED FILL — SM NICOTINE 2 MG LOZENGE: 2 | 6 days supply | Qty: 72 | Fill #0

## 2018-04-27 MED FILL — DULERA 100 MCG/5 MCG INH: 100-5 | 30 days supply | Qty: 13 | Fill #0

## 2018-05-01 ENCOUNTER — Telehealth (HOSPITAL_COMMUNITY): Payer: Self-pay | Admitting: *Deleted

## 2018-05-01 NOTE — Telephone Encounter (Signed)
Called and spoke to pt in regards to referral we received for this pt to participate in pulmonary rehab by Dr. Delford Field for COPD with acute Bronchitis.  Although pt now has Medicaid for insurance, Medicaid pulmonary rehab is not a covered benefit. Spoke to pt about the Pulmonary maintenance program.  Pt can not afford this cost at this time. Pt stated that he was planning to retire soon and will then be eligible to receive Medicare.  Asked pt that when he has Medicare to please have his PCP or Pulmonary MD re refer him.  Pt verbalized understanding.  Will route this note to patients MD. Karlene Lineman RN, BSN Cardiac and Pulmonary Rehab Nurse Navigator

## 2018-05-04 ENCOUNTER — Telehealth: Payer: Self-pay | Admitting: Pharmacist

## 2018-05-04 NOTE — Telephone Encounter (Signed)
Patient was seen by Corey Lawrence back in January for his chronic Hepatitis C infection.  Margaret, referral coordinator, states that the patient reached out to her and is wanting to start medications for his Hepatitis C. Notes from January state that patient needs an elastography before starting.  Due to the COVID-19 situation, he is unable to schedule until [facility] for elastography. Patient is asking to be started anyway as his wife is positive and has already started her treatment.  I told him I would reach out to stephanie to see her thoughts.

## 2018-05-08 NOTE — Telephone Encounter (Signed)
With the evidence of possible cirrhosis on his blood work we should wait to do the elastography to get more information about his liver.  I appreciate he wants to start this and I am hoping we will be able to do so in the coming months.  I am glad his wife is receiving treatment.  Hep C is not created equal and affects people very differently sometimes.  It may be that his wife did not have the signs of possible damage like Tacorey did.  Hard to know for sure but I suspect that to be the case.  I appreciate his patients I wish we could have gotten this done before decoded pandemic affected the outpatient nonurgent scheduling.

## 2018-05-11 NOTE — Telephone Encounter (Signed)
See above for that patient you asked me about last week

## 2018-05-12 ENCOUNTER — Telehealth: Payer: Self-pay | Admitting: Internal Medicine

## 2018-05-12 DIAGNOSIS — J432 Centrilobular emphysema: Secondary | ICD-10-CM

## 2018-05-12 NOTE — Telephone Encounter (Signed)
New Message   1) Medication(s) Requested (by name): albuterol (PROVENTIL HFA;VENTOLIN HFA) 108 (90 Base) MCG/ACT inhaler, predniSONE (DELTASONE) 10 MG tablet, and Antobiotics  2) Pharmacy of Choice: [facility]  3) Special Requests: Pt would like to stop taking umeclidinium bromide (INCRUSE ELLIPTA) 62.5 MCG/INH AEPB because it causes him to cough it back up and wants to go back to the symbicort   Approved medications will be sent to the pharmacy, we will reach out if there is an issue.  Requests made after 3pm may not be addressed until the following business day!  If a patient is unsure of the name of the medication(s) please note and ask patient to call back when they are able to provide all info, do not send to responsible party until all information is available!

## 2018-05-16 MED ORDER — PREDNISONE 10 MG PO TABS: ORAL_TABLET | ORAL | 0 refills | Status: DC

## 2018-05-16 MED FILL — predniSONE 10 MG TABS: 10 | 5 days supply | Qty: 20 | Fill #0

## 2018-05-18 NOTE — Telephone Encounter (Signed)
Contacted pt to go over Dr. Laural Benes message pt didn't answer left a detailed vm informing him of message and if he has any questions or concerns to give me a call

## 2018-05-25 ENCOUNTER — Other Ambulatory Visit: Payer: Self-pay | Admitting: Internal Medicine

## 2018-05-25 ENCOUNTER — Telehealth: Payer: Self-pay | Admitting: Internal Medicine

## 2018-05-25 DIAGNOSIS — J432 Centrilobular emphysema: Secondary | ICD-10-CM

## 2018-05-25 NOTE — Telephone Encounter (Signed)
1) Medication(s) Requested (by name): symbicort antibiotic 2) Pharmacy of Choice: [facility] [phone] [place]  3) Special Requests:   Approved medications will be sent to the pharmacy, we will reach out if there is an issue.  Requests made after 3pm may not be addressed until the following business day!  If a patient is unsure of the name of the medication(s) please note and ask patient to call back when they are able to provide all info, do not send to responsible party until all information is available!

## 2018-05-26 NOTE — Telephone Encounter (Signed)
Attempted to reach patient. No answer.

## 2018-05-26 NOTE — Telephone Encounter (Signed)
Per md patient needs an appt

## 2018-05-27 ENCOUNTER — Telehealth: Payer: Self-pay | Admitting: Internal Medicine

## 2018-05-27 NOTE — Telephone Encounter (Signed)
Patient called back please call him back

## 2018-05-27 NOTE — Telephone Encounter (Signed)
Pt called in wanting to speak with nurse would like a follow up call

## 2018-05-27 NOTE — Telephone Encounter (Signed)
Returned pt call pt didn't answer lvm asking pt to give me a call at his earliest convenience

## 2018-05-28 NOTE — Telephone Encounter (Signed)
Pt contacted the office and pt states he would like to get his symbicort back. Pt states he has medicaid.  Pt states he would like rx sent to [facility] in [place]

## 2018-05-29 NOTE — Telephone Encounter (Signed)
This was Dr. Henriette Combs message from 05/16/2018 "Let pt know that [facility] took the place of Symbicort. This what his insurance pays for. I sent RF on Prednisone to our pharmacy."

## 2018-05-29 NOTE — Telephone Encounter (Signed)
Pt aware that his medication are at the pharmacy: Azithromycin, Prednisone, Dulera, Albuterol inhaler and neb solution, Incruse and Dulera. Pt informed that he was not taking Symbicort per last OV note.

## 2018-06-26 ENCOUNTER — Other Ambulatory Visit: Payer: Medicaid Other

## 2018-07-14 ENCOUNTER — Other Ambulatory Visit: Payer: Medicaid Other

## 2018-07-17 ENCOUNTER — Other Ambulatory Visit: Payer: Self-pay | Admitting: Internal Medicine

## 2018-07-17 DIAGNOSIS — J432 Centrilobular emphysema: Secondary | ICD-10-CM

## 2018-07-23 ENCOUNTER — Other Ambulatory Visit: Payer: Self-pay

## 2018-07-23 ENCOUNTER — Ambulatory Visit: Payer: Medicaid Other | Attending: Internal Medicine | Admitting: Internal Medicine

## 2018-07-23 VITALS — BP 130/74 | HR 66 | Temp 98.1°F | Resp 16 | Wt 129.4 lb

## 2018-07-23 DIAGNOSIS — G453 Amaurosis fugax: Secondary | ICD-10-CM

## 2018-07-23 DIAGNOSIS — F17200 Nicotine dependence, unspecified, uncomplicated: Secondary | ICD-10-CM | POA: Diagnosis not present

## 2018-07-23 DIAGNOSIS — M79641 Pain in right hand: Secondary | ICD-10-CM

## 2018-07-23 DIAGNOSIS — J432 Centrilobular emphysema: Secondary | ICD-10-CM | POA: Diagnosis not present

## 2018-07-23 DIAGNOSIS — R918 Other nonspecific abnormal finding of lung field: Secondary | ICD-10-CM

## 2018-07-23 DIAGNOSIS — M5416 Radiculopathy, lumbar region: Secondary | ICD-10-CM

## 2018-07-23 DIAGNOSIS — M5412 Radiculopathy, cervical region: Secondary | ICD-10-CM

## 2018-07-23 MED ORDER — BUDESONIDE-FORMOTEROL FUMARATE 160-4.5 MCG/ACT IN AERO: 2.0000 | INHALATION_SPRAY | Freq: Two times a day (BID) | RESPIRATORY_TRACT | 6 refills | Status: DC

## 2018-07-23 MED ORDER — PREDNISONE 10 MG PO TABS: ORAL_TABLET | ORAL | 0 refills | Status: DC

## 2018-07-23 MED ORDER — ASPIRIN EC 81 MG PO TBEC: 81.0000 mg | DELAYED_RELEASE_TABLET | Freq: Every day | ORAL | 1 refills | Status: AC

## 2018-07-23 MED ORDER — TRAMADOL HCL 50 MG PO TABS: 50.0000 mg | ORAL_TABLET | Freq: Three times a day (TID) | ORAL | 0 refills | Status: DC | PRN

## 2018-07-23 MED ORDER — UMECLIDINIUM BROMIDE 62.5 MCG/INH IN AEPB: 1.0000 | INHALATION_SPRAY | Freq: Every day | RESPIRATORY_TRACT | 6 refills | Status: DC

## 2018-07-23 MED ORDER — GABAPENTIN 300 MG PO CAPS: ORAL_CAPSULE | ORAL | 1 refills | Status: DC

## 2018-07-23 MED ORDER — TIOTROPIUM BROMIDE MONOHYDRATE 18 MCG IN CAPS: 18.0000 ug | ORAL_CAPSULE | Freq: Every day | RESPIRATORY_TRACT | 12 refills | Status: DC

## 2018-07-23 MED FILL — SPIRIVA 18 MCG CP-HANDIHALE: 18 | 30 days supply | Qty: 30 | Fill #0

## 2018-07-23 MED FILL — predniSONE 10 MG TABS: 10 | 5 days supply | Qty: 20 | Fill #0

## 2018-07-23 MED FILL — traMADol HCL 50 MG TABS: 50 | 7 days supply | Qty: 21 | Fill #0

## 2018-07-23 MED FILL — GABAPENTIN 300 MG CAPSULE: 300 | 30 days supply | Qty: 60 | Fill #0

## 2018-07-23 NOTE — Progress Notes (Signed)
Pt states his back pain is radiating down his left leg  Pt states his neck pain is radiating down to his right arm   Pt states he doesn't want Incruse   Pt is requesting Symbicort

## 2018-07-23 NOTE — Patient Instructions (Signed)
Give patient a follow up appointment with Dr. Joya Gaskins.

## 2018-07-23 NOTE — Progress Notes (Signed)
Patient ID: Corey Lawrence, male    DOB: 11-Sep-1955  MRN: [id number]  CC: Fall   Subjective: Corey Lawrence is a 63 y.o. male who presents for chronic ds management and disability eval His concerns today include: COPD,lung nodules, Hep C,tob dep, chronic LBP and anxiety disorder. Patient brings a 7 page form with him to be completed for disability.  He also has written 4 pages explaining what he experiences from day-to-day in terms of his pain and COPD for me to read.  Thinks he has had a stroke. He describes what sounds like Amarous fugax 5 days ago where he loss top half of vision in RT eye x 10 mins.  No numbness or weakness in the extremities or slurred speech associated with the episode.  No HA -Gives history of issues with vision where he has problems focusing intermittently.  Occurs about twice a month.  However these episodes have increased to about twice a week.  Last eye exam was 2 years ago.  [place] off his porch 2 wks.  Nail came loose on one board.  When he step down on the board, board flipped over causing him to fall. Landed forward on contrete RT thumb was jammed and bruise and felt like it was broken. Since then he is also had increased pain in the lower back radiates down the left leg medially.  Numbness present in the left toes.  He has had a difficulty walking, sitting for more than a little while and cannot stand for more than 3 to 4 minutes.  Pain is more tolerable when he is laying in bed on his abdomen.  No loss of bowel or bladder function.  He has had to purchase a Rollator walker which he has with him today. Since this fall he is also had pain in his neck that radiates down the left arm and sometimes the right arm.  He feels like he has to pop his neck and when he is able to pain decreases a little bit.  Positive tingling on the dorsal surface of the right forearm and between the web of the thumb and index finger on the right hand.  COPD: Based on PFTs done by Dr.  Joya Gaskins he has severe COPD with gold stage C.  Referred to pulmonary rehab on last visit but patient states that Medicaid would not cover for it.  He feels limited in his activities due to COPD.  Cannot go outside on humid days due to difficulty breathing.  He feels like an elephant is sitting on his chest all the time.  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  Also has difficulties trying to do light housekeeping. -Wants Symbort rather than Incruse.  He feels that Symbicort works better.  He is on  Dulera, Incruse -Using neb BID Requests prescription for prednisone -Still smoking but states he is decreased significantly.  Down to 1 pk a wk using nicotine lozenges -He is due for repeat noncontrast CT of the chest for follow-up on lung nodules.  Hep C: Following with ID.   Patient Active Problem List   Diagnosis Date Noted   Urinary hesitancy 02/09/2018   Lung nodules 09/12/2017   Tobacco dependence 09/12/2017   Hep C w/o coma, chronic ([place]) 09/12/2017   Generalized anxiety disorder 05/22/2016   Prediabetes 09/29/2013  Chronic low back pain 09/29/2013   COPD with acute bronchitis (HCC) 09/21/2013     Current Outpatient Medications on File Prior to Visit  Medication Sig Dispense Refill   acetaminophen (TYLENOL) 325 MG tablet Take 2 tablets (650 mg total) by mouth every 6 (six) hours as needed for mild pain (or Fever >/= 101).     albuterol (PROVENTIL) (2.5 MG/3ML) 0.083% nebulizer solution Take 3 mLs (2.5 mg total) by nebulization every 6 (six) hours as needed for wheezing or shortness of breath. 150 mL 6   albuterol (VENTOLIN HFA) 108 (90 Base) MCG/ACT inhaler Inhale 1-2 puffs into the lungs every 6 (six) hours as needed for wheezing or shortness of breath. 36 g 0   diphenhydramine-acetaminophen (TYLENOL PM) 25-500 MG TABS tablet Take 1 tablet  by mouth at bedtime as needed.     Multiple Vitamins-Minerals (MULTIVITAMIN ADULTS 50+) TABS Take 1 tablet by mouth daily.     nicotine polacrilex (COMMIT) 2 MG lozenge Take 1 lozenge (2 mg total) by mouth as needed for smoking cessation. 50 tablet 1   Respiratory Therapy Supplies (FLUTTER) DEVI Use 4 times daily 1 each 0   traZODone (DESYREL) 50 MG tablet Take 0.5-1 tablets (25-50 mg total) by mouth at bedtime as needed for sleep. 30 tablet 6   No current facility-administered medications on file prior to visit.     No Known Allergies  Social History   Socioeconomic History   Marital status: Divorced    Spouse name: Not on file   Number of children: Not on file   Years of education: Not on file   Highest education level: Not on file  Occupational History   Not on file  Social Needs   Financial resource strain: Not on file   Food insecurity    Worry: Not on file    Inability: Not on file   Transportation needs    Medical: Not on file    Non-medical: Not on file  Tobacco Use   Smoking status: Current Every Day Smoker    Packs/day: 0.25    Years: 43.00    Pack years: 10.75    Types: Cigarettes   Smokeless tobacco: Never Used   Tobacco comment: currently on patch  Substance and Sexual Activity   Alcohol use: No    Alcohol/week: 0.0 standard drinks    Comment: "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"   Drug use: Yes    Comment: 09/21/2013 "used marijuana once when I was 20; used cocaine  in ~ 2000; stopped in ~ 2003"   Sexual activity: Not Currently  Lifestyle   Physical activity    Days per week: Not on file    Minutes per session: Not on file   Stress: Not on file  Relationships   Social connections    Talks on phone: Not on file    Gets together: Not on file    Attends religious service: Not on file    Active member of club or organization: Not on file    Attends meetings of clubs or organizations: Not on file    Relationship status: Not on file    Intimate partner violence    Fear of current or ex partner: Not on file    Emotionally abused: Not on file    Physically abused: Not on file    Forced sexual activity: Not on file  Other Topics Concern   Not on file  Social History Narrative   Lives in trailer.  Not married.  5 children.      Family History  Problem Relation Age of Onset   Esophageal cancer Father    Colon cancer Sister    Cancer - Other Brother    Heart disease Mother    Diabetes Mellitus II Mother    Heart disease Other        multiple maternal family members   Diabetes Mellitus II Other        multiple maternal family members    Past Surgical History:  Procedure Laterality Date   INCISION AND DRAINAGE OF WOUND Left ~ 1977   "splinter got infected between" digits 2 & 3    ROS: Review of Systems Negative except as stated above  PHYSICAL EXAM: BP 130/74    Pulse 66    Temp 98.1 F (36.7 C) (Oral)    Resp 16    Wt 129 lb 6.4 oz (58.7 kg)    SpO2 97%    BMI 23.29 kg/m   Wt Readings from Last 3 Encounters:  07/23/18 129 lb 6.4 oz (58.7 kg)  04/24/18 122 lb 9.6 oz (55.6 kg)  02/17/18 126 lb (57.2 kg)    Physical Exam  General appearance -elderly Caucasian male in NAD.  Mental status - normal mood, behavior, speech, dress, motor activity, and thought processes Neck -no cervical lymphadenopathy.  No thyromegaly. Chest -breath sounds decreased with diffuse wheezing.   Heart - normal rate, regular rhythm, normal S1, S2, no murmurs, rubs, clicks or gallops Neurological -patient is ambulating with a Rollator walker.  He is noted to have a slight limp favoring the left leg.  He is able to transfer onto the table unassisted without having to use the Rollator.  On straight leg raise he is passively and actively able to raise both legs to about 60 degrees without apparent difficulty.  Power in the lower extremities 5/5 on the right.  4/5 on the left.  Gross sensation intact in both lower extremities.     He has mild discomfort on passive rotation of the neck.  Grip on the right slightly limited 4+/5 due to pain in the right thumb.  On the left 4+/5.  Power distally in the upper extremities 5/5 bilaterally.  Proximally 4/5 on the left (patient states this is the shoulder that has been injured in the past) and 4+/5 on the right Musculoskeletal -right thumb mild tenderness on palpation of the [facility]. Extremities -no lower extremity edema CMP Latest Ref Rng & Units 02/09/2018 02/09/2018 07/22/2017  Glucose 65 - 99 mg/dL 88 - 161(W246(H)  BUN 7 - 25 mg/dL 10 - 12  Creatinine 9.600.70 - 1.25 mg/dL 4.540.86 - 0.980.84  Sodium 119135 - 146 mmol/L 139 - 141  Potassium 3.5 - 5.3 mmol/L 3.7 - 4.4  Chloride 98 - 110 mmol/L 103 - 101  CO2 20 - 32 mmol/L 28 - 23  Calcium 8.6 - 10.3 mg/dL 9.7 - 14.710.1  Total Protein 6.1 - 8.1 g/dL 7.0 - 7.2  Total Bilirubin 0.2 - 1.2 mg/dL 0.6 - 0.3  Alkaline Phos 39 - 117 IU/L - - 114  AST 10 - 35 U/L 64(H) - 61(H)  ALT 9 - 46 U/L 57(H) 60(H) 97(H)   Lipid Panel     Component Value Date/Time   CHOL 153 07/22/2017 1040   TRIG 76 07/22/2017 1040   HDL 37 (L) 07/22/2017 1040   CHOLHDL 4.1 07/22/2017 1040   CHOLHDL 3.6 12/05/2014 1214   VLDL 22 12/05/2014 1214   LDLCALC  101 (H) 07/22/2017 1040    CBC    Component Value Date/Time   WBC 4.5 02/09/2018 1026   RBC 4.36 02/09/2018 1026   HGB 14.3 02/09/2018 1026   HGB 14.7 07/22/2017 1040   HCT 41.5 02/09/2018 1026   HCT 43.4 07/22/2017 1040   PLT 157 02/09/2018 1026   PLT 252 07/22/2017 1040   MCV 95.2 02/09/2018 1026   MCV 94 07/22/2017 1040   MCH 32.8 02/09/2018 1026   MCHC 34.5 02/09/2018 1026   RDW 12.0 02/09/2018 1026   RDW 14.0 07/22/2017 1040   LYMPHSABS 0.8 04/04/2017 0739   LYMPHSABS 2.4 08/14/2016 1630   MONOABS 1.0 04/04/2017 0739   EOSABS 0.1 04/04/2017 0739   EOSABS 0.7 (H) 08/14/2016 1630   BASOSABS 0.0 04/04/2017 0739   BASOSABS 0.1 08/14/2016 1630    ASSESSMENT AND PLAN: 1. Centrilobular emphysema  (HCC) I will write a letter in support of disability for him based on the severity of his COPD and functional limitations as a result of it. Advised patient that Symbicort is not the same class as Dulera.  He [facility] to [facility] to the [facility]ic[facility].  We will keep the Incruse.  However I was later informed by the pharmacy that his insurance will only pay for Spiriva so I changed the Incruse to Spiriva. We will arrange follow-up with Dr. Delford FieldWright - predniSONE (DELTASONE) 10 MG tablet; Take 4 tablets daily for 5 days then stop  Dispense: 20 tablet; Refill: 0 - budesonide-formoterol (SYMBICORT) 160-4.5 MCG/ACT inhaler; Inhale 2 puffs into the lungs 2 (two) times daily.  Dispense: 1 Inhaler; Refill: 6 - tiotropium (SPIRIVA) 18 MCG inhalation capsule; Place 1 capsule (18 mcg total) into inhaler and inhale daily.  Dispense: 30 capsule; Refill: 12  2. Tobacco dependence Strongly advised to quit smoking.  Commended him on how he has cut back significantly.  Encouraged him to set a quit date  3. AFX (amaurosis fugax) Will need work-up. - aspirin EC 81 MG tablet; Take 1 tablet (81 mg total) by mouth daily.  Dispense: 100 tablet; Refill: 1 - US Carotid Duplex Bilateral; Future - ECHOCARDIOGRAM COMPLETE; Future - CT Head Wo Contrast; Future  4. Cervical radiculopathy We will start with plain x-ray to rule out any fractures. Given prescription for tramadol to use as needed for pain.  Avoid added gabapentin.  Recommend warm compresses.  If no improvement on follow-up in 3 weeks we will proceed with MRI - traMADol (ULTRAM) 50 MG tablet; Take 1 tablet (50 mg total) by mouth every 8 (eight) hours as needed.  Dispense: 60 tablet; Refill: 0 - gabapentin (NEURONTIN) 300 MG capsule; 1 cap daily at bedtime x 1 wk then BID PO  Dispense: 60 capsule; Refill: 1 - DG Cervical Spine Complete; Future  5. Lumbar radiculopathy Limited supply of tramadol given to use as needed for pain.  Gabapentin added.  Recommend  warm compresses or heating pad as needed We will get x-ray of the lumbar spine.  If no improvement on follow-up in 3 weeks we will proceed to more detail imaging - traMADol (ULTRAM) 50 MG tablet; Take 1 tablet (50 mg total) by mouth every 8 (eight) hours as needed.  Dispense: 60 tablet; Refill: 0 - gabapentin (NEURONTIN) 300 MG capsule; 1 cap daily at bedtime x 1 wk then BID PO  Dispense: 60 capsule; Refill: 1 - DG Lumbar Spine Complete; Future  6. Lung nodules Advised to quit smoking - CT CHEST NODULE FOLLOW UP LOW DOSE  W/O; Future  7. Hand pain, right - DG Hand Complete Right; Future   Patient was given the opportunity to ask questions.  Patient verbalized understanding of the plan and was able to repeat key elements of the plan.   Orders Placed This Encounter  Procedures   US Carotid Duplex Bilateral   CT Head Wo Contrast   CT CHEST NODULE FOLLOW UP LOW DOSE W/O   DG Lumbar Spine Complete   DG Cervical Spine Complete   DG Hand Complete Right   ECHOCARDIOGRAM COMPLETE     Requested Prescriptions   Signed Prescriptions Disp Refills   aspirin EC 81 MG tablet 100 tablet 1    Sig: Take 1 tablet (81 mg total) by mouth daily.   predniSONE (DELTASONE) 10 MG tablet 20 tablet 0    Sig: Take 4 tablets daily for 5 days then stop   budesonide-formoterol (SYMBICORT) 160-4.5 MCG/ACT inhaler 1 Inhaler 6    Sig: Inhale 2 puffs into the lungs 2 (two) times daily.   traMADol (ULTRAM) 50 MG tablet 60 tablet 0    Sig: Take 1 tablet (50 mg total) by mouth every 8 (eight) hours as needed.   gabapentin (NEURONTIN) 300 MG capsule 60 capsule 1    Sig: 1 cap daily at bedtime x 1 wk then BID PO   tiotropium (SPIRIVA) 18 MCG inhalation capsule 30 capsule 12    Sig: Place 1 capsule (18 mcg total) into inhaler and inhale daily.    Return in about 3 weeks (around 08/13/2018).  Jonah Blueeborah Laloni Rowton, MD, FACP

## 2018-07-24 ENCOUNTER — Encounter: Payer: Self-pay | Admitting: Internal Medicine

## 2018-07-27 ENCOUNTER — Telehealth: Payer: Self-pay

## 2018-07-27 NOTE — Telephone Encounter (Signed)
Went on the [employer] and did prior auth for images    CPT- G0297 Low dose Ct Scan           [id number] CT Head Wo  ICD- R91.8 Lung nodule         G45.3 Amaurosis fugax  Authorization #[id number] Effective- 07/27/18 Expires- 01/23/19

## 2018-07-30 ENCOUNTER — Other Ambulatory Visit: Payer: Self-pay | Admitting: Internal Medicine

## 2018-07-30 ENCOUNTER — Telehealth: Payer: Self-pay | Admitting: *Deleted

## 2018-07-30 DIAGNOSIS — R911 Solitary pulmonary nodule: Secondary | ICD-10-CM

## 2018-07-30 NOTE — Telephone Encounter (Signed)
[place] is contacting the patient directly to schedule is CT of the head and chest as well as his xrays same day.

## 2018-07-31 ENCOUNTER — Other Ambulatory Visit: Payer: Self-pay

## 2018-07-31 ENCOUNTER — Ambulatory Visit (HOSPITAL_COMMUNITY)
Admission: RE | Admit: 2018-07-31 | Discharge: 2018-07-31 | Disposition: A | Discharge status: Home / Self Care | Payer: Medicaid Other | Source: Ambulatory Visit | Attending: Internal Medicine | Admitting: Internal Medicine

## 2018-07-31 ENCOUNTER — Ambulatory Visit (HOSPITAL_COMMUNITY): Payer: Medicaid Other

## 2018-07-31 DIAGNOSIS — M5412 Radiculopathy, cervical region: Secondary | ICD-10-CM | POA: Diagnosis not present

## 2018-07-31 DIAGNOSIS — M5416 Radiculopathy, lumbar region: Secondary | ICD-10-CM | POA: Insufficient documentation

## 2018-07-31 DIAGNOSIS — H538 Other visual disturbances: Secondary | ICD-10-CM | POA: Diagnosis not present

## 2018-07-31 DIAGNOSIS — M79641 Pain in right hand: Secondary | ICD-10-CM | POA: Insufficient documentation

## 2018-07-31 DIAGNOSIS — S6991XA Unspecified injury of right wrist, hand and finger(s), initial encounter: Secondary | ICD-10-CM | POA: Diagnosis not present

## 2018-07-31 DIAGNOSIS — G453 Amaurosis fugax: Secondary | ICD-10-CM

## 2018-07-31 DIAGNOSIS — F17200 Nicotine dependence, unspecified, uncomplicated: Secondary | ICD-10-CM | POA: Diagnosis not present

## 2018-07-31 DIAGNOSIS — G459 Transient cerebral ischemic attack, unspecified: Secondary | ICD-10-CM | POA: Insufficient documentation

## 2018-07-31 DIAGNOSIS — S199XXA Unspecified injury of neck, initial encounter: Secondary | ICD-10-CM | POA: Diagnosis not present

## 2018-07-31 DIAGNOSIS — M542 Cervicalgia: Secondary | ICD-10-CM | POA: Diagnosis not present

## 2018-07-31 DIAGNOSIS — M545 Low back pain: Secondary | ICD-10-CM | POA: Diagnosis not present

## 2018-07-31 NOTE — Progress Notes (Signed)
  Echocardiogram 2D Echocardiogram has been performed.  Corey Lawrence 07/31/2018, 10:58 AM

## 2018-08-04 ENCOUNTER — Telehealth: Payer: Self-pay | Admitting: *Deleted

## 2018-08-04 DIAGNOSIS — M5416 Radiculopathy, lumbar region: Secondary | ICD-10-CM

## 2018-08-04 DIAGNOSIS — M5412 Radiculopathy, cervical region: Secondary | ICD-10-CM

## 2018-08-04 NOTE — Telephone Encounter (Signed)
Patient verified DOB Patient has been made aware of all imaging results and states back pain is still at is worse. Patient would like to move forward with the MRI

## 2018-08-04 NOTE — Telephone Encounter (Signed)
Medical Assistant left message on patient's home and cell voicemail. Voicemail states to give a call back to [place] with [facility] at [phone]. Patients CT was negative for any changes consistent with stroke. Xray showed no fractures of the right hand. Xray of the neck showed degenerative changes. Xray of the spine showed arthritis. If back and neck pain is still present and not improved we need to move forward with MRI.

## 2018-08-04 NOTE — Telephone Encounter (Signed)
-----   Message from Ladell Pier, MD sent at 07/31/2018  4:44 PM EDT ----- Let pt know that CT of head is negative for changes consistent with stroke.

## 2018-08-05 ENCOUNTER — Telehealth: Payer: Self-pay | Admitting: Internal Medicine

## 2018-08-05 NOTE — Telephone Encounter (Signed)
New Message   Pt states he woke up this morning with a knot the size of a big grape and he is concerned

## 2018-08-05 NOTE — Telephone Encounter (Signed)
Patients call taken.  Patient identified by name and date of birth.  Patient states he woke up this am with a grape size nodule under his right arm.  Initially pain was severe but as the day went on patient states it became 3/10.  Patient states appearance was sudden.  Patient advised to go to UC to be evaluated.  Patient acknowledged understanding of advice.

## 2018-08-05 NOTE — Telephone Encounter (Signed)
Nurse called the patient's home phone number but received no answer and message was left on the voicemail for the patient to call back.  Return phone number given. 

## 2018-08-06 NOTE — Telephone Encounter (Signed)
I will complete PA's and then schedule

## 2018-08-11 ENCOUNTER — Other Ambulatory Visit: Payer: Self-pay | Admitting: Pharmacist

## 2018-08-11 ENCOUNTER — Ambulatory Visit
Admission: RE | Admit: 2018-08-11 | Discharge: 2018-08-11 | Disposition: A | Discharge status: Home / Self Care | Payer: Medicaid Other | Source: Ambulatory Visit | Attending: Infectious Diseases | Admitting: Infectious Diseases

## 2018-08-11 ENCOUNTER — Telehealth: Payer: Self-pay | Admitting: Infectious Diseases

## 2018-08-11 DIAGNOSIS — K760 Fatty (change of) liver, not elsewhere classified: Secondary | ICD-10-CM | POA: Diagnosis not present

## 2018-08-11 DIAGNOSIS — R161 Splenomegaly, not elsewhere classified: Secondary | ICD-10-CM | POA: Diagnosis not present

## 2018-08-11 DIAGNOSIS — B182 Chronic viral hepatitis C: Secondary | ICD-10-CM

## 2018-08-11 MED ORDER — MAVYRET 100-40 MG PO TABS: 3.0000 | ORAL_TABLET | Freq: Every day | ORAL | 1 refills | Status: DC

## 2018-08-11 NOTE — Telephone Encounter (Signed)
LVM for Mr. Raimondi to call back to discuss [place] results:   Moderate concern for fibrosis of the liver but no cirrhosis/permanent damage or mass/tumor detected.  Will need to update his labs for medicaid to approve with paying for [place]. Will also need to come in to sign required paperwork as well.    Janene Madeira, MSN, NP-C [facility] [email or web address] Pager: [phone] Office: [phone] [place]: [phone]

## 2018-08-11 NOTE — Progress Notes (Signed)
Sending in [place] x 8 weeks for patient's chronic Hepatitis C infection.  Patient now has active [place] Medicaid, so he will need to come in to the office to sign the Medicaid readiness form.  Medicaid will also need updated viral load and genotype (genotype could not be done back in January). Will have labs drawn when he comes in. Called and left VM for him to call me back.

## 2018-08-11 NOTE — Progress Notes (Signed)
Looks like he was uninsured before and now has active Medicaid, so we need the readiness form. Do you want to get labs while he is here signing the paper just in case?

## 2018-08-11 NOTE — Progress Notes (Signed)
Oh yes, I see now that his viral load was >6 months ago, so they will definitely need an updated one (medicaid has so many requirements!). I called him and left a VM to call me back.

## 2018-08-11 NOTE — Progress Notes (Signed)
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 Unable to generate genotype last draw but RNA >44million copies.  If we need to repeat any labs for insurance we can re-run Genotype + RNA but otherwise I am comfortable using mavyret for 8 weeks - I didn't see any drug-drug issues passing through medications.   I will call [place] to discuss details of his results.

## 2018-08-11 NOTE — Progress Notes (Signed)
He has Medicaid so I am willing to bet we need a genotype, but we can try without one!

## 2018-08-12 ENCOUNTER — Encounter: Payer: Self-pay | Admitting: Critical Care Medicine

## 2018-08-12 ENCOUNTER — Other Ambulatory Visit: Payer: Self-pay

## 2018-08-12 ENCOUNTER — Ambulatory Visit: Payer: Medicaid Other | Attending: Critical Care Medicine | Admitting: Critical Care Medicine

## 2018-08-12 VITALS — BP 110/74 | HR 64 | Temp 98.5°F | Resp 18 | Ht 64.5 in | Wt 128.0 lb

## 2018-08-12 DIAGNOSIS — F17200 Nicotine dependence, unspecified, uncomplicated: Secondary | ICD-10-CM

## 2018-08-12 DIAGNOSIS — Z79899 Other long term (current) drug therapy: Secondary | ICD-10-CM | POA: Insufficient documentation

## 2018-08-12 DIAGNOSIS — F17210 Nicotine dependence, cigarettes, uncomplicated: Secondary | ICD-10-CM | POA: Diagnosis not present

## 2018-08-12 DIAGNOSIS — Z8249 Family history of ischemic heart disease and other diseases of the circulatory system: Secondary | ICD-10-CM | POA: Insufficient documentation

## 2018-08-12 DIAGNOSIS — R590 Localized enlarged lymph nodes: Secondary | ICD-10-CM

## 2018-08-12 DIAGNOSIS — J449 Chronic obstructive pulmonary disease, unspecified: Secondary | ICD-10-CM | POA: Diagnosis present

## 2018-08-12 DIAGNOSIS — M5416 Radiculopathy, lumbar region: Secondary | ICD-10-CM

## 2018-08-12 DIAGNOSIS — G8929 Other chronic pain: Secondary | ICD-10-CM | POA: Diagnosis not present

## 2018-08-12 DIAGNOSIS — Z7951 Long term (current) use of inhaled steroids: Secondary | ICD-10-CM | POA: Insufficient documentation

## 2018-08-12 DIAGNOSIS — R918 Other nonspecific abnormal finding of lung field: Secondary | ICD-10-CM

## 2018-08-12 DIAGNOSIS — M5412 Radiculopathy, cervical region: Secondary | ICD-10-CM

## 2018-08-12 DIAGNOSIS — F411 Generalized anxiety disorder: Secondary | ICD-10-CM

## 2018-08-12 DIAGNOSIS — M545 Low back pain: Secondary | ICD-10-CM

## 2018-08-12 DIAGNOSIS — Z7982 Long term (current) use of aspirin: Secondary | ICD-10-CM | POA: Insufficient documentation

## 2018-08-12 DIAGNOSIS — J432 Centrilobular emphysema: Secondary | ICD-10-CM | POA: Diagnosis not present

## 2018-08-12 DIAGNOSIS — J440 Chronic obstructive pulmonary disease with acute lower respiratory infection: Secondary | ICD-10-CM | POA: Diagnosis not present

## 2018-08-12 DIAGNOSIS — J209 Acute bronchitis, unspecified: Secondary | ICD-10-CM | POA: Diagnosis not present

## 2018-08-12 DIAGNOSIS — Z833 Family history of diabetes mellitus: Secondary | ICD-10-CM | POA: Insufficient documentation

## 2018-08-12 DIAGNOSIS — Z800 Family history of malignant neoplasm of digestive organs: Secondary | ICD-10-CM | POA: Diagnosis not present

## 2018-08-12 MED ORDER — INCRUSE ELLIPTA 62.5 MCG/INH IN AEPB: 1.0000 | INHALATION_SPRAY | Freq: Every day | RESPIRATORY_TRACT | 6 refills | Status: DC

## 2018-08-12 MED ORDER — TRAMADOL HCL 50 MG PO TABS: 50.0000 mg | ORAL_TABLET | Freq: Three times a day (TID) | ORAL | 0 refills | Status: DC | PRN

## 2018-08-12 MED ORDER — LEVOFLOXACIN 500 MG PO TABS: 500.0000 mg | ORAL_TABLET | Freq: Every day | ORAL | 0 refills | Status: DC

## 2018-08-12 MED ORDER — PREDNISONE 10 MG PO TABS: ORAL_TABLET | ORAL | 0 refills | Status: DC

## 2018-08-12 MED ORDER — BUDESONIDE-FORMOTEROL FUMARATE 160-4.5 MCG/ACT IN AERO: 2.0000 | INHALATION_SPRAY | Freq: Two times a day (BID) | RESPIRATORY_TRACT | 6 refills | Status: DC

## 2018-08-12 NOTE — Assessment & Plan Note (Signed)
I spent some time today discussing smoking cessation with this patient he realizes unless he quit smoking his lung conditions go to progressively decline

## 2018-08-12 NOTE — Assessment & Plan Note (Signed)
There is evidence now for axillary lymphadenopathy on the right  We will follow-up the CT scan of the chest and based on this may need an axillary lymph node biopsy

## 2018-08-12 NOTE — Assessment & Plan Note (Signed)
Chronic obstructive lung disease with FEV1 of 50% predicted total lung capacity 144% predicted and diffusion capacity down to 53% predicted consistent with gold stage C COPD  Now with an acute exacerbation and associated centrilobular emphysema  Plan will be to administer Levaquin 500 mg a day for 7 days and pulse prednisone again 40 mg a day for 5 days  We will continue Symbicort 2 inhalations twice daily and discontinue Spiriva in favor of Incruse 1 puff daily

## 2018-08-12 NOTE — Progress Notes (Signed)
Subjective:    Patient ID: Corey Lawrence, male    DOB: Jun 17, 1955, 63 y.o.   MRN: [id number]  This is a 63 year old male who not seen since January 2020 who has advanced chronic obstructive lung disease.  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  The patient had been on previously [facility] and Incruse but because after obtaining Medicaid for and formulary changes he was switched back to Symbicort and Spiriva.  He states the Symbicort is doing well but he does not like the Spiriva and prefers Incruse.  He believes that his Incruse is available through the prescription program for Medicaid.  The patient continues to smoke a pack a week of cigarettes.  He has been trying to cut back and is using nicotine replacement therapy for this.  The patient states his dyspnea is getting progressively worse and he has had coughing up thick yellow mucus.  The mucus is difficult to raise.  Additional problem is that he recently had change in vision on the right eye and he is undergoing a work-up for this problem which includes carotid ultrasound which is pending.  Also the patient has had a fall from his porch 2 weeks ago and is exacerbated chronic low back pain and he has pending MRIs of his spine and neck.  An additional problem is that he has bilateral pulmonary nodules obtained on a diagnosis and August 2019.  He needs follow-up CT scanning of the chest. Patient also notes increased swelling under the right arm and is concerned about lymph node being enlarged at that location.  The patient is undergoing disability evaluation and did bring disability paperwork to his primary care physician Dr. Wynetta Emery.  The patient wanted for me a letter stating his ongoing disability.  Note he was given gabapentin for pain however he had significant nausea with this and is not taking the gabapentin The patient is requesting a refill on tramadol he only received 21 tablets and only  has 2 these left.  It does not help his pain and he is requesting a pain management referral  He is in the hepatitis C clinic and is now about to undergo therapy for his hepatitis C   Shortness of Breath This is a chronic problem. The current episode started more than 1 year ago. The problem occurs constantly (any activity is dyspneic, walking or picking up an object ). The problem has been gradually worsening. Associated symptoms include headaches, neck pain, PND, sputum production and wheezing. Pertinent negatives include no chest pain, fever, hemoptysis, leg swelling or orthopnea. The symptoms are aggravated by lying flat, weather changes, emotional upset, exercise and any activity. Associated symptoms comments: Left arm numbness, mucus is green yellow. Risk factors include smoking. He has tried steroid inhalers and beta agonist inhalers for the symptoms. His past medical history is significant for COPD and pneumonia. There is no history of CAD, a heart failure or PE.   Past Medical History:  Diagnosis Date  . Ankle fracture, left    "casted; no OR"  . Chronic lower back pain   . COPD (chronic obstructive pulmonary disease) ([place])   . Tobacco abuse      Family History  Problem Relation Age of Onset  . Esophageal cancer Father   . Colon cancer Sister   . Cancer - Other Brother   . Heart disease Mother   . Diabetes Mellitus II Mother   . Heart disease  Other        multiple maternal family members  . Diabetes Mellitus II Other        multiple maternal family members     Social History   Socioeconomic History  . Marital status: Divorced    Spouse name: Not on file  . Number of children: Not on file  . Years of education: Not on file  . Highest education level: Not on file  Occupational History  . Not on file  Social Needs  . Financial resource strain: Not on file  . Food insecurity    Worry: Not on file    Inability: Not on file  . Transportation needs    Medical: Not on  file    Non-medical: Not on file  Tobacco Use  . Smoking status: Current Every Day Smoker    Packs/day: 0.25    Years: 43.00    Pack years: 10.75    Types: Cigarettes  . Smokeless tobacco: Never Used  . Tobacco comment: currently on patch  Substance and Sexual Activity  . 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 Drug use: Yes    Comment: 09/21/2013 "used marijuana once when I was 20; used cocaine  in ~ 2000; stopped in ~ 2003"  . Sexual activity: Not Currently  Lifestyle  . Physical activity    Days per week: Not on file    Minutes per session: Not on file  . Stress: Not on file  Relationships  . Social Musicianconnections    Talks on phone: Not on file    Gets together: Not on file    Attends religious service: Not on file    Active member of club or organization: Not on file    Attends meetings of clubs or organizations: Not on file    Relationship status: Not on file  . Intimate partner violence    Fear of current or ex partner: Not on file    Emotionally abused: Not on file    Physically abused: Not on file    Forced sexual activity: Not on file  Other Topics Concern  . Not on file  Social History Narrative   Lives in trailer.  Not married.  5 children.       No Known Allergies   Outpatient Medications Prior to Visit  Medication Sig Dispense Refill  . acetaminophen (TYLENOL) 325 MG tablet Take 2 tablets (650 mg total) by mouth every 6 (six) hours as needed for mild pain (or Fever >/= 101).    Marland Kitchen. albuterol (PROVENTIL) (2.5 MG/3ML) 0.083% nebulizer solution Take 3 mLs (2.5 mg total) by nebulization every 6 (six) hours as needed for wheezing or shortness of breath. 150 mL 6  . albuterol (VENTOLIN HFA) 108 (90 Base) MCG/ACT inhaler Inhale 1-2 puffs into the lungs every 6 (six) hours as needed for wheezing or shortness of breath. 36 g 0  . aspirin EC 81 MG tablet Take 1 tablet (81 mg total) by mouth daily. 100 tablet 1   . diphenhydramine-acetaminophen (TYLENOL PM) 25-500 MG TABS tablet Take 1 tablet by mouth at bedtime as needed.    . Glecaprevir-Pibrentasvir (MAVYRET) 100-40 MG TABS Take 3 tablets by mouth daily with breakfast. 84 tablet 1  . Multiple Vitamins-Minerals (MULTIVITAMIN ADULTS 50+) TABS Take 1 tablet by mouth daily.    . nicotine polacrilex (COMMIT) 2 MG lozenge Take 1 lozenge (2 mg total) by mouth as needed  for smoking cessation. 50 tablet 1  . Respiratory Therapy Supplies (FLUTTER) DEVI Use 4 times daily 1 each 0  . traZODone (DESYREL) 50 MG tablet Take 0.5-1 tablets (25-50 mg total) by mouth at bedtime as needed for sleep. 30 tablet 6  . budesonide-formoterol (SYMBICORT) 160-4.5 MCG/ACT inhaler Inhale 2 puffs into the lungs 2 (two) times daily. 1 Inhaler 6  . traMADol (ULTRAM) 50 MG tablet Take 1 tablet (50 mg total) by mouth every 8 (eight) hours as needed. 60 tablet 0  . gabapentin (NEURONTIN) 300 MG capsule 1 cap daily at bedtime x 1 wk then BID PO (Patient not taking: Reported on 08/12/2018) 60 capsule 1  . predniSONE (DELTASONE) 10 MG tablet Take 4 tablets daily for 5 days then stop 20 tablet 0  . tiotropium (SPIRIVA) 18 MCG inhalation capsule Place 1 capsule (18 mcg total) into inhaler and inhale daily. (Patient not taking: Reported on 08/12/2018) 30 capsule 12   No facility-administered medications prior to visit.       Review of Systems  Constitutional: Positive for activity change and fatigue. Negative for appetite change and fever.  HENT: Negative.   Eyes: Positive for visual disturbance.  Respiratory: Positive for cough, sputum production, chest tightness, shortness of breath and wheezing. Negative for hemoptysis.   Cardiovascular: Positive for PND. Negative for chest pain, orthopnea and leg swelling.  Gastrointestinal: Negative.   Genitourinary: Negative.   Musculoskeletal: Positive for back pain, gait problem, myalgias, neck pain and neck stiffness.  Skin: Negative.    Neurological: Positive for dizziness, weakness, numbness and headaches. Negative for tremors, syncope, facial asymmetry, speech difficulty and light-headedness.  Hematological: Positive for adenopathy.  Psychiatric/Behavioral: Negative.        Objective:   Physical Exam Vitals:   08/12/18 1105  BP: 110/74  Pulse: 64  Resp: 18  Temp: 98.5 F (36.9 C)  TempSrc: Oral  SpO2: 97%  Weight: 128 lb (58.1 kg)  Height: 5' 4.5" (1.638 m)    Gen: Pleasant, thin  in no distress,  normal affect  ENT: No lesions,  mouth clear,  oropharynx clear, no postnasal drip  Neck: No JVD, no TMG, no carotid bruits  Lungs: No use of accessory muscles, no dullness to percussion, scattered rhonchi and expired wheezes with poor breath sounds , There is evidence for right axillary lymphadenopathy  cardiovascular: RRR, heart sounds normal, no murmur or gallops, no peripheral edema  Abdomen: soft and NT, no HSM,  BS normal  Musculoskeletal: No deformities, no cyanosis or clubbing  Neuro: alert, non focal  Skin: Warm, no lesions or rashes  CT of the chest was reviewed from August 2019 and showed scattered pulmonary nodules appear to be benign in nature     Assessment & Plan:  I personally reviewed all [facility] and [facility] as well as any outside material available during this office visit and agree with the  radiology impressions.   COPD with acute bronchitis (HCC) Chronic obstructive lung disease with FEV1 of 50% predicted total lung capacity 144% predicted and diffusion capacity down to 53% predicted consistent with gold stage C COPD  Now with an acute exacerbation and associated centrilobular emphysema  Plan will be to administer Levaquin 500 mg a day for 7 days and pulse prednisone again 40 mg a day for 5 days  We will continue Symbicort 2 inhalations twice daily and discontinue Spiriva in favor of Incruse 1 puff daily  Axillary lymphadenopathy There is evidence now for  axillary lymphadenopathy on the right  We will follow-up the CT scan of the chest and based on this may need an axillary lymph node biopsy  Chronic low back pain Severe low back pain with associated radiculopathy  MRI of the neck and lumbar spine is pending  I did refill tramadol No. 40 count take 50 mg every 8 hours as needed  I checked the [place] controlled substance database and found only Dr. Laural BenesJohnson being the other prescriber for the tramadol  Tobacco dependence I spent some time today discussing smoking cessation with this patient he realizes unless he quit smoking his lung conditions go to progressively decline   Corey Lawrence was seen today for copd.  Diagnoses and all orders for this visit:  COPD with acute bronchitis (HCC)  Cervical radiculopathy -     Discontinue: traMADol (ULTRAM) 50 MG tablet; Take 1 tablet (50 mg total) by mouth every 8 (eight) hours as needed. -     traMADol (ULTRAM) 50 MG tablet; Take 1 tablet (50 mg total) by mouth every 8 (eight) hours as needed.  Lumbar radiculopathy -     Discontinue: traMADol (ULTRAM) 50 MG tablet; Take 1 tablet (50 mg total) by mouth every 8 (eight) hours as needed. -     traMADol (ULTRAM) 50 MG tablet; Take 1 tablet (50 mg total) by mouth every 8 (eight) hours as needed.  Centrilobular emphysema (HCC) -     budesonide-formoterol (SYMBICORT) 160-4.5 MCG/ACT inhaler; Inhale 2 puffs into the lungs 2 (two) times daily. -     predniSONE (DELTASONE) 10 MG tablet; Take 4 tablets daily for 5 days then stop  Generalized anxiety disorder  Lung nodules  Chronic bilateral low back pain without sciatica  Axillary lymphadenopathy  Tobacco dependence  Other orders -     levofloxacin (LEVAQUIN) 500 MG tablet; Take 1 tablet (500 mg total) by mouth daily. -     umeclidinium bromide (INCRUSE ELLIPTA) 62.5 MCG/INH AEPB; Inhale 1 puff into the lungs daily.    I had an extended discussion with the patient and or family lasting 8  minutes of a 25 minute visit including: Smoking cessation counseling

## 2018-08-12 NOTE — Patient Instructions (Signed)
We will switch her to Incruse 1 puff daily and discontinue Spiriva  Continue Symbicort 2 inhalations twice daily refill sent to our pharmacy  Begin Levaquin 1 daily for 7 days  Take another round of prednisone 40 mg a day for 5 days  Tramadol was refilled and sent to our pharmacy  We are planning to obtain your CT of your chest for further evaluation  Continue to focus on smoking cessation  We will likely need to have the lymph node under your right arm biopsied  Return to see Dr. Joya Gaskins in 6 weeks

## 2018-08-12 NOTE — Telephone Encounter (Signed)
PA's completed and patient has been scheduled.

## 2018-08-12 NOTE — Assessment & Plan Note (Signed)
Severe low back pain with associated radiculopathy  MRI of the neck and lumbar spine is pending  I did refill tramadol No. 40 count take 50 mg every 8 hours as needed  I checked the [place] controlled substance database and found only Dr. Wynetta Emery being the other prescriber for the tramadol

## 2018-08-17 ENCOUNTER — Ambulatory Visit (HOSPITAL_COMMUNITY): Payer: Medicaid Other | Attending: Internal Medicine

## 2018-08-24 ENCOUNTER — Ambulatory Visit (HOSPITAL_COMMUNITY): Payer: Medicaid Other | Attending: Internal Medicine

## 2018-08-24 ENCOUNTER — Ambulatory Visit (HOSPITAL_COMMUNITY): Admission: RE | Admit: 2018-08-24 | Payer: Medicaid Other | Source: Ambulatory Visit

## 2018-08-27 ENCOUNTER — Other Ambulatory Visit: Payer: Self-pay

## 2018-08-27 ENCOUNTER — Ambulatory Visit: Payer: Medicaid Other | Admitting: Pharmacist

## 2018-08-27 DIAGNOSIS — B182 Chronic viral hepatitis C: Secondary | ICD-10-CM

## 2018-08-27 IMAGING — DX DG CHEST 2V
2 series · 2 of 2 positions shown · non-contrast
Comparison: PA and lateral chest x-ray May 08, 2015

CLINICAL DATA: History of COPD Increase shortness of breath since
running out of nebulizer treatment medication 2 weeks ago. The
patient is coughing ended distress. Patient also reports left-sided
chest pressure. Recently discontinued smoking.

EXAM:
CHEST  2 VIEW

[chest pa]
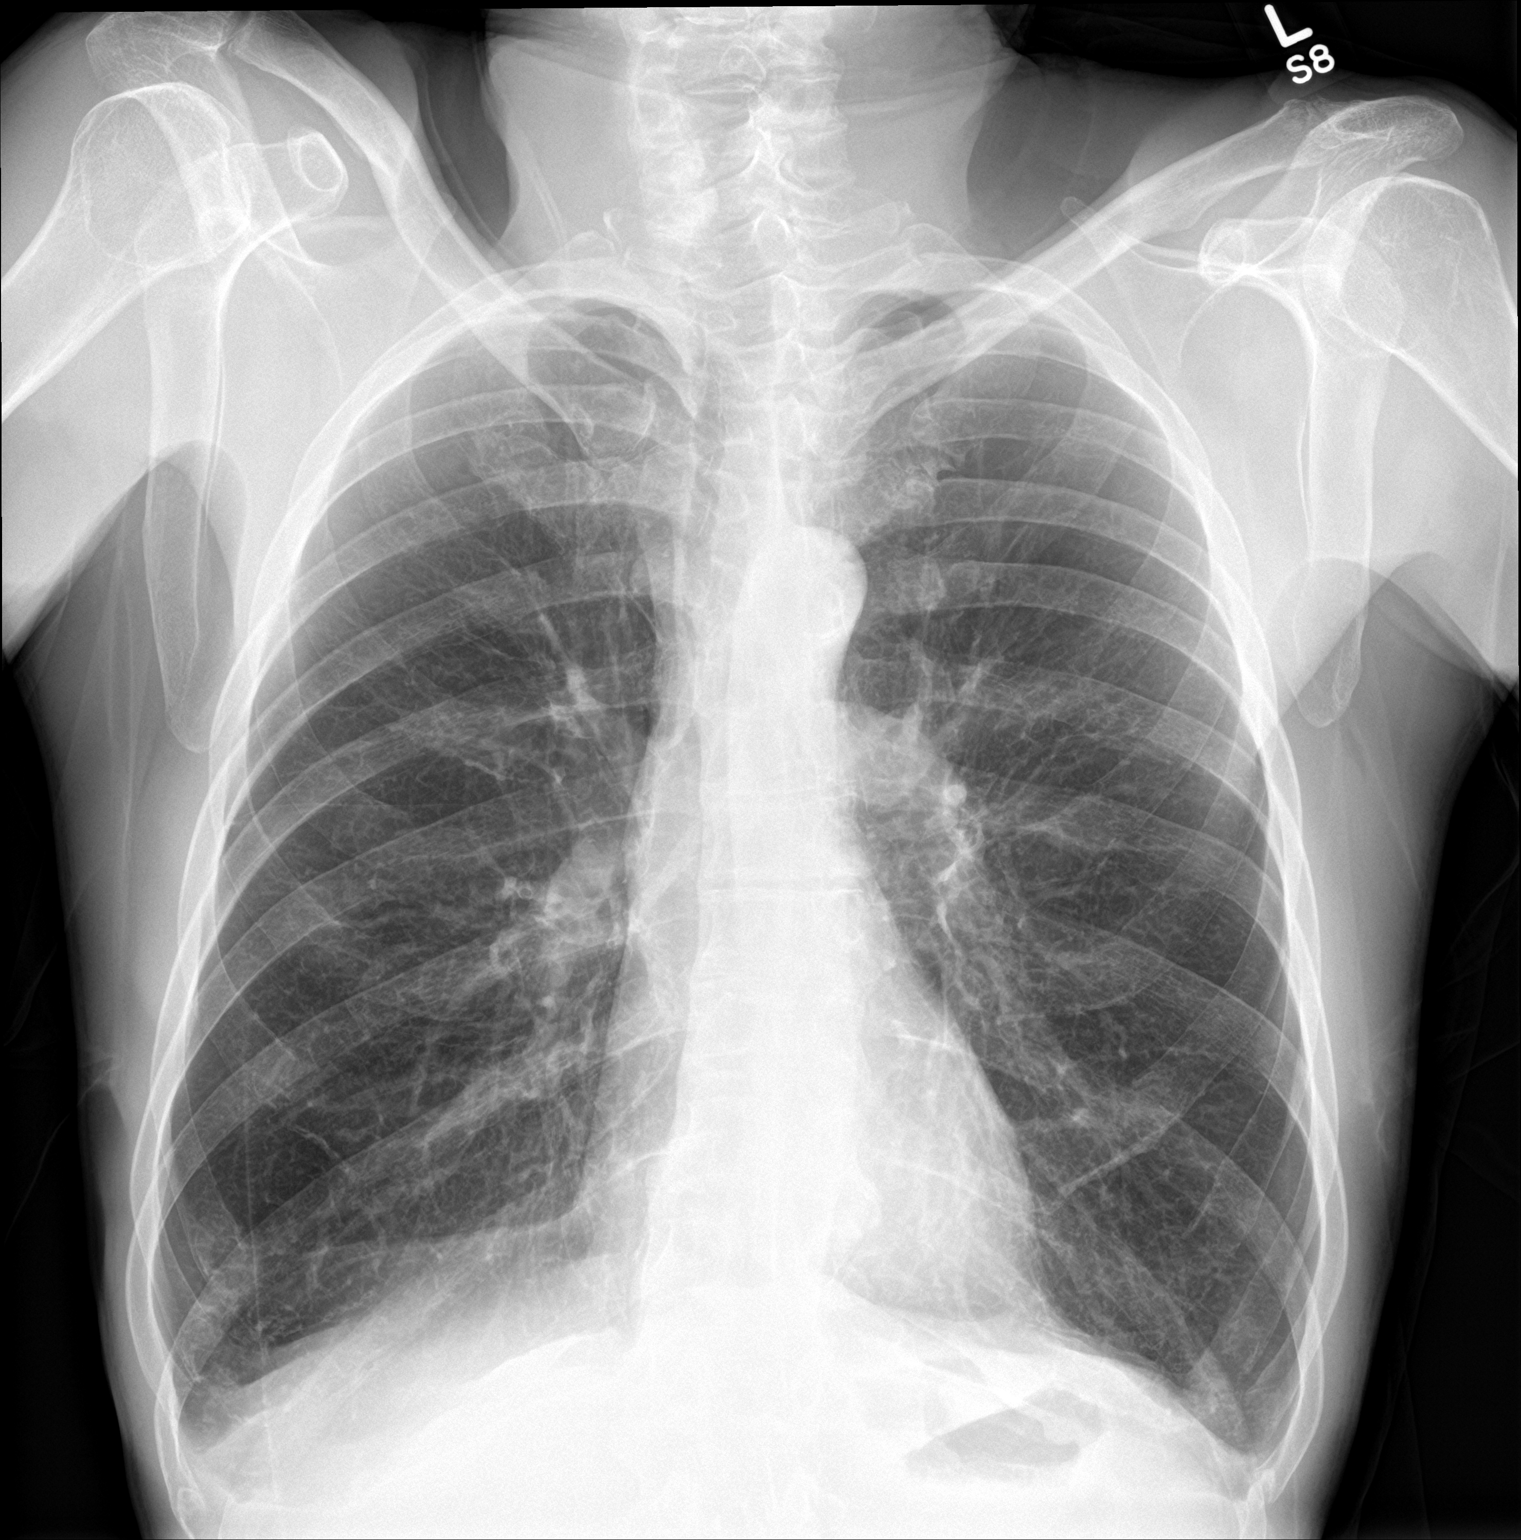

[chest lat]
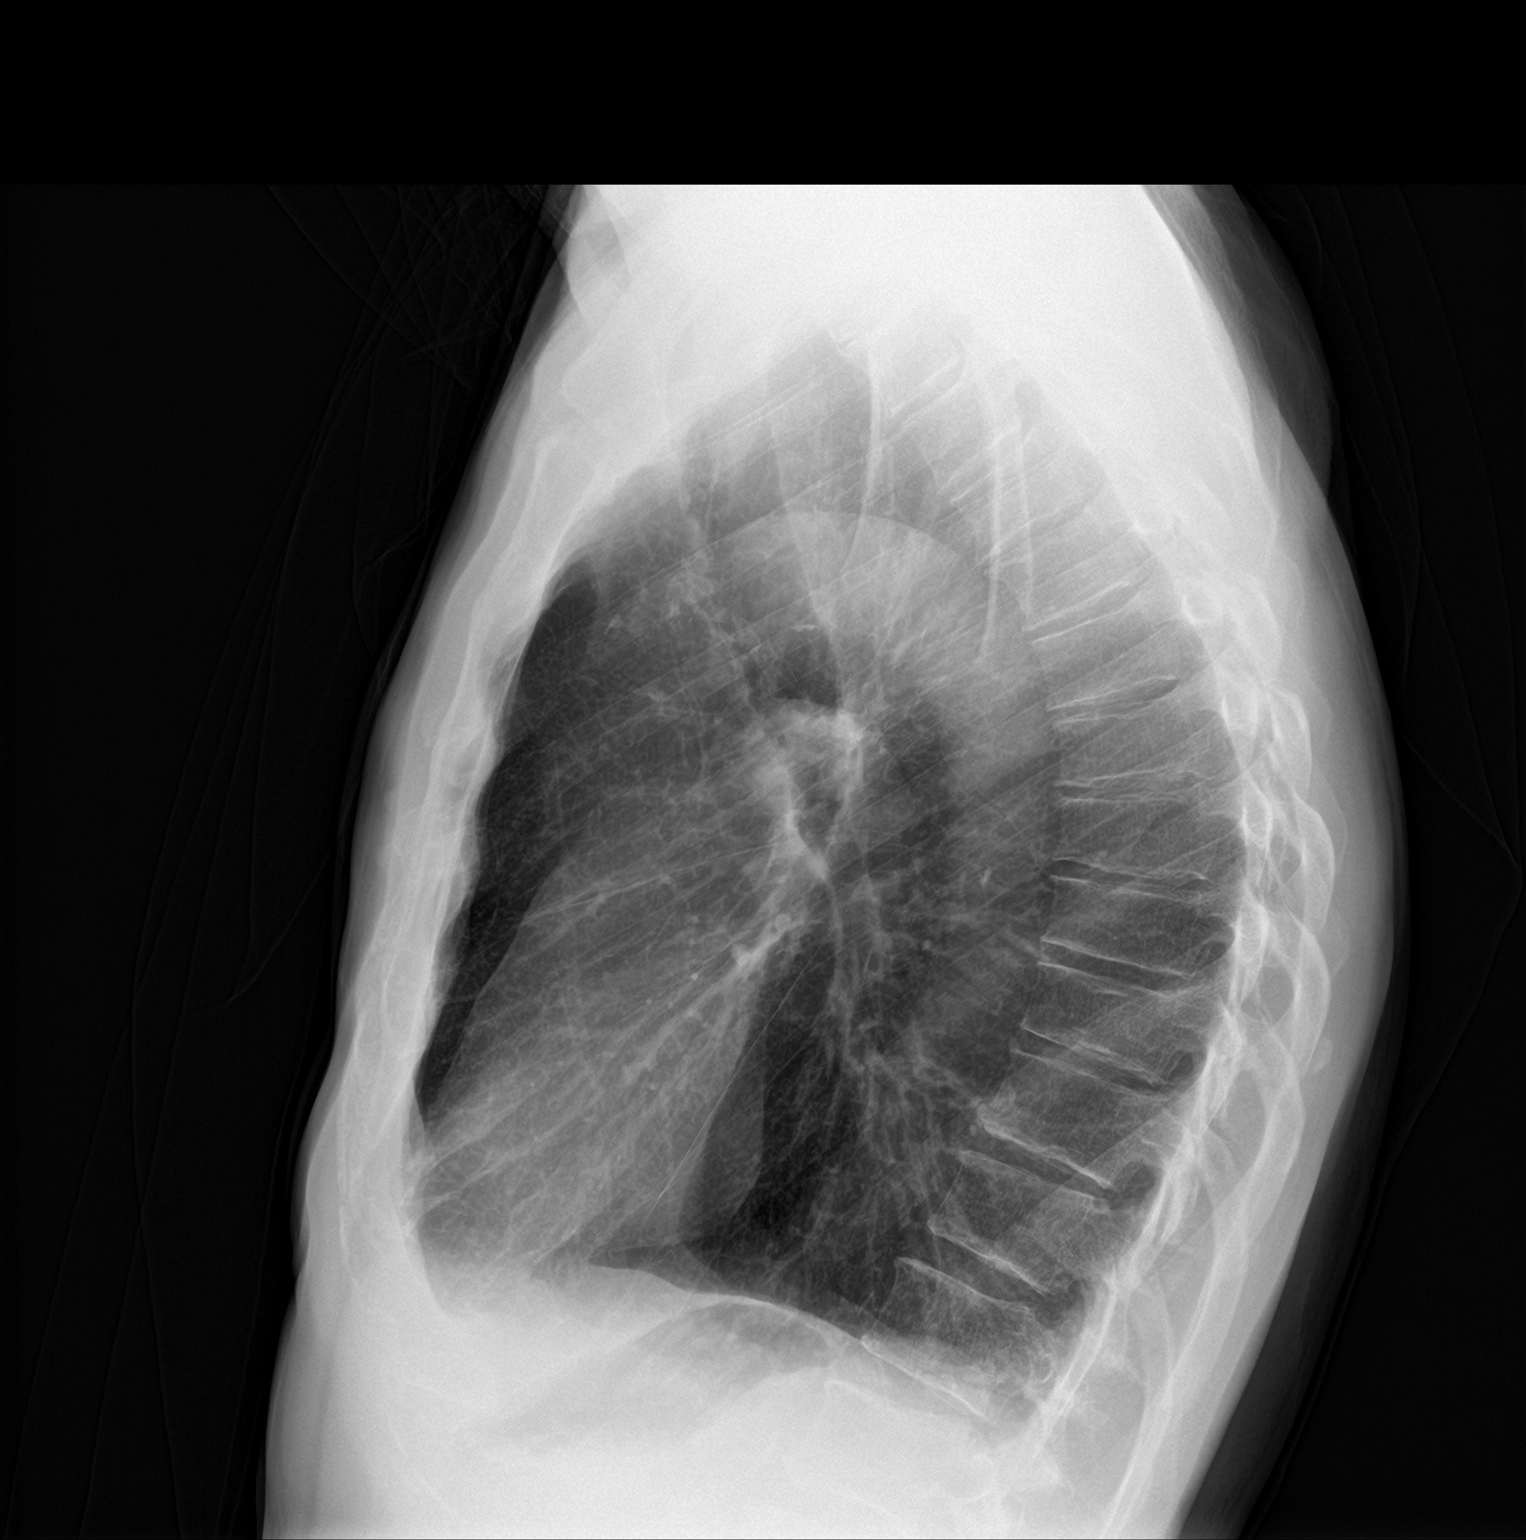

[2 of 2 positions shown; findings below may reference images not displayed]

FINDINGS: The lungs remain hyperinflated. There is no focal infiltrate. There
is no pleural effusion. The heart and pulmonary vascularity are
normal. The mediastinum is normal in width. There is calcification
in the wall of the aortic arch. The bony thorax exhibits no acute
abnormality.
IMPRESSION: COPD.  No pneumonia nor other acute cardiopulmonary abnormality.

Thoracic aortic atherosclerosis.

## 2018-08-27 NOTE — Progress Notes (Signed)
Patient came in to sign the Medicaid Readiness form for Hepatitis C treatment.  Medicaid also requires that Hepatitis C labs be within 6 months and his last labs were in January.  He will get them redrawn today.  Will call patient when his medication is approved.

## 2018-09-06 LAB — COMPREHENSIVE METABOLIC PANEL
AG Ratio: 1.1 (calc) (ref 1.0–2.5)
ALT: 99 U/L — ABNORMAL HIGH (ref 9–46)
AST: 103 U/L — ABNORMAL HIGH (ref 10–35)
Albumin: 4.1 g/dL (ref 3.6–5.1)
Alkaline phosphatase (APISO): 72 U/L (ref 35–144)
BUN: 21 mg/dL (ref 7–25)
CO2: 27 mmol/L (ref 20–32)
Calcium: 9.5 mg/dL (ref 8.6–10.3)
Chloride: 103 mmol/L (ref 98–110)
Creat: 0.99 mg/dL (ref 0.70–1.25)
Globulin: 3.7 g/dL (calc) (ref 1.9–3.7)
Glucose, Bld: 138 mg/dL — ABNORMAL HIGH (ref 65–99)
Potassium: 4 mmol/L (ref 3.5–5.3)
Sodium: 140 mmol/L (ref 135–146)
Total Bilirubin: 0.6 mg/dL (ref 0.2–1.2)
Total Protein: 7.8 g/dL (ref 6.1–8.1)

## 2018-09-06 LAB — HEPATITIS C GENOTYPE: HCV Genotype: 2

## 2018-09-06 LAB — CBC
HCT: 41.2 % (ref 38.5–50.0)
Hemoglobin: 14.3 g/dL (ref 13.2–17.1)
MCH: 32.2 pg (ref 27.0–33.0)
MCHC: 34.7 g/dL (ref 32.0–36.0)
MCV: 92.8 fL (ref 80.0–100.0)
MPV: 9.9 fL (ref 7.5–12.5)
Platelets: 193 10*3/uL (ref 140–400)
RBC: 4.44 10*6/uL (ref 4.20–5.80)
RDW: 12 % (ref 11.0–15.0)
WBC: 3.1 10*3/uL — ABNORMAL LOW (ref 3.8–10.8)

## 2018-09-06 LAB — HEPATITIS C RNA QUANTITATIVE
HCV Quantitative Log: 6.68 Log IU/mL — ABNORMAL HIGH
HCV RNA, PCR, QN: 4840000 IU/mL — ABNORMAL HIGH

## 2018-09-07 ENCOUNTER — Telehealth: Payer: Self-pay | Admitting: Pharmacy Technician

## 2018-09-07 NOTE — Telephone Encounter (Signed)
[facility] Patient Advocate Encounter   Received notification from [place] that prior authorization for MAVYRET is required.   PA submitted on 09/07/2018 Key [id number] W Status is pending    [facility] will continue to follow.   Venida Jarvis. Nadara Mustard [place] Patient [facility] Phone: [phone] Fax:  [phone]

## 2018-09-11 ENCOUNTER — Encounter: Payer: Self-pay | Admitting: Pharmacy Technician

## 2018-09-11 ENCOUNTER — Telehealth: Payer: Self-pay | Admitting: Pharmacy Technician

## 2018-09-11 NOTE — Telephone Encounter (Signed)
[facility] Patient Advocate Encounter  Prior Authorization for Mavyret has been approved.    PA# [id number] W Effective dates: 09/07/2018 through 11/06/2018  Patients co-pay is $3.    Venida Jarvis. Nadara Mustard [place] Patient [facility] Phone: [phone] Fax:  [phone]

## 2018-09-14 ENCOUNTER — Telehealth: Payer: Self-pay | Admitting: Pharmacist

## 2018-09-14 MED FILL — MAVYRET 100-40 MG TABS: 100-40 | 28 days supply | Qty: 84 | Fill #0

## 2018-09-14 NOTE — Telephone Encounter (Signed)
Patient is approved to receive Mavyret x 8 weeks for chronic Hepatitis C infection. Counseled patient to take all three tablets of Mavyret daily with food.  Counseled patient the need to take all three tablets together and to not separate them out during the day. Encouraged patient not to miss any doses and explained how their chance of cure could go down with each dose missed. Counseled patient on what to do if dose is missed - if it is closer to the missed dose take immediately; if closer to next dose then skip dose and take the next dose at the usual time.   Counseled patient on common side effects such as headache, fatigue, and nausea and that these normally decrease with time. I reviewed patient medications and found no drug interactions. Discussed with patient that there are several drug interactions with Mavyret and instructed patient to call the clinic if he wishes to start a new medication during course of therapy. Also advised patient to call if he experiences any side effects. He will start the medication on 8/12 and will follow-up with me in the pharmacy clinic on 9/14.

## 2018-09-14 NOTE — Telephone Encounter (Signed)
Thank you! Glad he is getting treated.

## 2018-09-23 ENCOUNTER — Ambulatory Visit: Payer: Medicaid Other | Attending: Critical Care Medicine | Admitting: Critical Care Medicine

## 2018-09-23 ENCOUNTER — Other Ambulatory Visit: Payer: Self-pay

## 2018-09-23 ENCOUNTER — Encounter: Payer: Self-pay | Admitting: Critical Care Medicine

## 2018-09-23 VITALS — BP 119/74 | HR 68 | Temp 98.2°F | Resp 16 | Wt 127.6 lb

## 2018-09-23 DIAGNOSIS — G8929 Other chronic pain: Secondary | ICD-10-CM

## 2018-09-23 DIAGNOSIS — F17200 Nicotine dependence, unspecified, uncomplicated: Secondary | ICD-10-CM | POA: Diagnosis not present

## 2018-09-23 DIAGNOSIS — J432 Centrilobular emphysema: Secondary | ICD-10-CM | POA: Diagnosis not present

## 2018-09-23 DIAGNOSIS — J440 Chronic obstructive pulmonary disease with acute lower respiratory infection: Secondary | ICD-10-CM | POA: Diagnosis not present

## 2018-09-23 DIAGNOSIS — Z7982 Long term (current) use of aspirin: Secondary | ICD-10-CM | POA: Insufficient documentation

## 2018-09-23 DIAGNOSIS — M545 Low back pain: Secondary | ICD-10-CM

## 2018-09-23 DIAGNOSIS — F17210 Nicotine dependence, cigarettes, uncomplicated: Secondary | ICD-10-CM | POA: Insufficient documentation

## 2018-09-23 DIAGNOSIS — Z7952 Long term (current) use of systemic steroids: Secondary | ICD-10-CM | POA: Insufficient documentation

## 2018-09-23 DIAGNOSIS — Z800 Family history of malignant neoplasm of digestive organs: Secondary | ICD-10-CM | POA: Diagnosis not present

## 2018-09-23 DIAGNOSIS — R918 Other nonspecific abnormal finding of lung field: Secondary | ICD-10-CM

## 2018-09-23 DIAGNOSIS — R590 Localized enlarged lymph nodes: Secondary | ICD-10-CM | POA: Insufficient documentation

## 2018-09-23 DIAGNOSIS — Z833 Family history of diabetes mellitus: Secondary | ICD-10-CM | POA: Diagnosis not present

## 2018-09-23 DIAGNOSIS — M5416 Radiculopathy, lumbar region: Secondary | ICD-10-CM | POA: Diagnosis not present

## 2018-09-23 DIAGNOSIS — Z23 Encounter for immunization: Secondary | ICD-10-CM | POA: Diagnosis not present

## 2018-09-23 DIAGNOSIS — Z7951 Long term (current) use of inhaled steroids: Secondary | ICD-10-CM | POA: Insufficient documentation

## 2018-09-23 DIAGNOSIS — Z808 Family history of malignant neoplasm of other organs or systems: Secondary | ICD-10-CM | POA: Insufficient documentation

## 2018-09-23 DIAGNOSIS — J209 Acute bronchitis, unspecified: Secondary | ICD-10-CM

## 2018-09-23 MED ORDER — PREDNISONE 10 MG PO TABS: ORAL_TABLET | ORAL | 0 refills | Status: DC

## 2018-09-23 MED FILL — predniSONE 10 MG TABS: 10 | 5 days supply | Qty: 20 | Fill #0

## 2018-09-23 NOTE — Assessment & Plan Note (Signed)
Lumbar and cervical radiculopathy with pending MRIs again have encouraged the patient to keep his MRI appointments and further pain management will be per primary care

## 2018-09-23 NOTE — Assessment & Plan Note (Signed)
Chronic obstructive lung disease with gold stage C COPD  Plan is to repulse prednisone 40 mg daily for 5 days and encourage the use of Symbicort 2 puffs twice daily I gave the patient a flutter valve today and taught him how to use it and he is to use it with his nebulizer treatments  Also will ask for assistance on obtaining the Incruse at 1 puff daily

## 2018-09-23 NOTE — Patient Instructions (Addendum)
Flu vaccine was given  We will reschedule your CT Chest and MRI of back, you did not make your last appointment for those studies  Focus on smoking cessation  Take prednisone 10mg  Take 4 tablets daily for 5 days then stop  Make sure you take symbicort 160 two puffs twice daily  Use your flutter valve after each breathing treatment, 10 repetitions each time of blowing into the valve to help clear your mucus out   Return Dr Joya Gaskins 6 weeks   I will ask Dr Wynetta Emery to address your pain management.   Influenza Virus Vaccine injection (Fluarix) What is this medicine? INFLUENZA VIRUS VACCINE (in floo EN zuh VAHY ruhs vak SEEN) helps to reduce the risk of getting influenza also known as the flu. This medicine may be used for other purposes; ask your health care provider or pharmacist if you have questions. COMMON BRAND NAME(S): Fluarix, Fluzone What should I tell my health care provider before I take this medicine? They need to know if you have any of these conditions:  bleeding disorder like hemophilia  fever or infection  Guillain-Barre syndrome or other neurological problems  immune system problems  infection with the human immunodeficiency virus (HIV) or AIDS  low blood platelet counts  multiple sclerosis  an unusual or allergic reaction to influenza virus vaccine, eggs, chicken proteins, latex, gentamicin, other medicines, foods, dyes or preservatives  pregnant or trying to get pregnant  breast-feeding How should I use this medicine? This vaccine is for injection into a muscle. It is given by a health care professional. A copy of Vaccine Information Statements will be given before each vaccination. Read this sheet carefully each time. The sheet may change frequently. Talk to your pediatrician regarding the use of this medicine in children. Special care may be needed. Overdosage: If you think you have taken too much of this medicine contact a poison control center or  emergency room at once. NOTE: This medicine is only for you. Do not share this medicine with others. What if I miss a dose? This does not apply. What may interact with this medicine?  chemotherapy or radiation therapy  medicines that lower your immune system like etanercept, anakinra, infliximab, and adalimumab  medicines that treat or prevent blood clots like warfarin  phenytoin  steroid medicines like prednisone or cortisone  theophylline  vaccines This list may not describe all possible interactions. Give your health care provider a list of all the medicines, herbs, non-prescription drugs, or dietary supplements you use. Also tell them if you smoke, drink alcohol, or use illegal drugs. Some items may interact with your medicine. What should I watch for while using this medicine? Report any side effects that do not go away within 3 days to your doctor or health care professional. Call your health care provider if any unusual symptoms occur within 6 weeks of receiving this vaccine. You may still catch the flu, but the illness is not usually as bad. You cannot get the flu from the vaccine. The vaccine will not protect against colds or other illnesses that may cause fever. The vaccine is needed every year. What side effects may I notice from receiving this medicine? Side effects that you should report to your doctor or health care professional as soon as possible:  allergic reactions like skin rash, itching or hives, swelling of the face, lips, or tongue Side effects that usually do not require medical attention (report to your doctor or health care professional if they continue  or are bothersome):  fever  headache  muscle aches and pains  pain, tenderness, redness, or swelling at site where injected  weak or tired This list may not describe all possible side effects. Call your doctor for medical advice about side effects. You may report side effects to FDA at  1-800-FDA-1088. Where should I keep my medicine? This vaccine is only given in a clinic, pharmacy, doctor's office, or other health care setting and will not be stored at home. NOTE: This sheet is a summary. It may not cover all possible information. If you have questions about this medicine, talk to your doctor, pharmacist, or health care provider.  2020 Elsevier/Gold Standard (2007-08-19 09:30:40)

## 2018-09-23 NOTE — Assessment & Plan Note (Signed)
With lung nodules and evidence of right axillary lymphadenopathy I encouraged the patient to please follow-up with a CT of the chest which was previously ordered

## 2018-09-23 NOTE — Assessment & Plan Note (Signed)
Unchanged axillary lymphadenopathy again needs to follow-up with a CT of the chest

## 2018-09-23 NOTE — Progress Notes (Signed)
Subjective:    Patient ID: Corey HazardDavid W Sobotta, male    DOB: 07/11/1955, 63 y.o.   MRN: [id number]  This is a 63 year old male who has advanced chronic obstructive lung disease.  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  [facility] but because after obtaining Medicaid for and formulary changes he was switched back to Symbicort and Spiriva.   Note unfortunately he is only been using the Symbicort once a day, and has run out of the Incruse and needs a refill.  Also a flutter valve was ordered the last visit but he never picked this up.   The patient continues to smoke a pack a week of cigarettes.  He has been trying to cut back and is using nicotine replacement therapy for this.  The patient states his dyspnea is getting progressively worse and he has had coughing up thick yellow mucus.  The mucus is difficult to raise.    The patient also has had chronic low back pain that was exacerbated by a fall previously this summer from his porch.  He has an MRI of the spine that is pending and has not yet made the appointment for that that was scheduled.  He also has nodules in his chest which need follow-up from her previous CT August 2019.  Once again he failed to follow-up on the order for this scan.  The patient was asking for pain management from me but I indicated to him I am seeing him for his lungs that he should follow-up with his primary care provider regarding the pain management.  He states the gabapentin caused side effects and tramadol does not affect his pain.  He is firmly adamant he does not want higher dose opiates   He is in the hepatitis C clinic and had a recent ultrasound of the liver showing moderate risk for fibrosis but currently only hepatic steatosis and he is now starting antivirals for hepatitis C     Shortness of Breath This is a chronic problem. The current episode started more than 1 year  ago. The problem occurs constantly (any activity is dyspneic, walking or picking up an object ). The problem has been gradually worsening. Associated symptoms include headaches, neck pain, PND, sputum production and wheezing. Pertinent negatives include no chest pain, fever, hemoptysis, leg swelling or orthopnea. The symptoms are aggravated by lying flat, weather changes, emotional upset, exercise and any activity. Associated symptoms comments: Left arm numbness, mucus is green yellow. Risk factors include smoking. He has tried steroid inhalers and beta agonist inhalers for the symptoms. His past medical history is significant for COPD and pneumonia. There is no history of CAD, a heart failure or PE.   Past Medical History:  Diagnosis Date  . Ankle fracture, left    "casted; no OR"  . Chronic lower back pain   . COPD (chronic obstructive pulmonary disease) (HCC)   . Tobacco abuse      Family History  Problem Relation Age of Onset  . Esophageal cancer Father   . Colon cancer Sister   . Cancer - Other Brother   . Heart disease Mother   . Diabetes Mellitus II Mother   . Heart disease Other        multiple maternal family members  . Diabetes Mellitus II Other        multiple maternal family members  Social History   Socioeconomic History  . Marital status: Divorced    Spouse name: Not on file  . Number of children: Not on file  . Years of education: Not on file  . Highest education level: Not on file  Occupational History  . Not on file  Social Needs  . Financial resource strain: Not on file  . Food insecurity    Worry: Not on file    Inability: Not on file  . Transportation needs    Medical: Not on file    Non-medical: Not on file  Tobacco Use  . Smoking status: Current Every Day Smoker    Packs/day: 0.25    Years: 43.00    Pack years: 10.75    Types: Cigarettes  . Smokeless tobacco: Never Used  . Tobacco comment: currently on patch  Substance and Sexual Activity  .  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 Drug use: Yes    Comment: 09/21/2013 "used marijuana once when I was 20; used cocaine  in ~ 2000; stopped in ~ 2003"  . Sexual activity: Not Currently  Lifestyle  . Physical activity    Days per week: Not on file    Minutes per session: Not on file  . Stress: Not on file  Relationships  . Social Musicianconnections    Talks on phone: Not on file    Gets together: Not on file    Attends religious service: Not on file    Active member of club or organization: Not on file    Attends meetings of clubs or organizations: Not on file    Relationship status: Not on file  . Intimate partner violence    Fear of current or ex partner: Not on file    Emotionally abused: Not on file    Physically abused: Not on file    Forced sexual activity: Not on file  Other Topics Concern  . Not on file  Social History Narrative   Lives in trailer.  Not married.  5 children.       No Known Allergies   Outpatient Medications Prior to Visit  Medication Sig Dispense Refill  . acetaminophen (TYLENOL) 325 MG tablet Take 2 tablets (650 mg total) by mouth every 6 (six) hours as needed for mild pain (or Fever >/= 101).    Marland Kitchen. albuterol (PROVENTIL) (2.5 MG/3ML) 0.083% nebulizer solution Take 3 mLs (2.5 mg total) by nebulization every 6 (six) hours as needed for wheezing or shortness of breath. 150 mL 6  . albuterol (VENTOLIN HFA) 108 (90 Base) MCG/ACT inhaler Inhale 1-2 puffs into the lungs every 6 (six) hours as needed for wheezing or shortness of breath. 36 g 0  . aspirin EC 81 MG tablet Take 1 tablet (81 mg total) by mouth daily. 100 tablet 1  . budesonide-formoterol (SYMBICORT) 160-4.5 MCG/ACT inhaler Inhale 2 puffs into the lungs 2 (two) times daily. 1 Inhaler 6  . diphenhydramine-acetaminophen (TYLENOL PM) 25-500 MG TABS tablet Take 1 tablet by mouth at bedtime as needed.    . Glecaprevir-Pibrentasvir (MAVYRET)  100-40 MG TABS Take 3 tablets by mouth daily with breakfast. 84 tablet 1  . Multiple Vitamins-Minerals (MULTIVITAMIN ADULTS 50+) TABS Take 1 tablet by mouth daily.    . nicotine polacrilex (COMMIT) 2 MG lozenge Take 1 lozenge (2 mg total) by mouth as needed for smoking cessation. 50 tablet 1  . Respiratory Therapy Supplies (FLUTTER) DEVI  Use 4 times daily 1 each 0  . traMADol (ULTRAM) 50 MG tablet Take 1 tablet (50 mg total) by mouth every 8 (eight) hours as needed. 40 tablet 0  . traZODone (DESYREL) 50 MG tablet Take 0.5-1 tablets (25-50 mg total) by mouth at bedtime as needed for sleep. 30 tablet 6  . umeclidinium bromide (INCRUSE ELLIPTA) 62.5 MCG/INH AEPB Inhale 1 puff into the lungs daily. 30 each 6  . gabapentin (NEURONTIN) 300 MG capsule 1 cap daily at bedtime x 1 wk then BID PO (Patient not taking: Reported on 09/23/2018) 60 capsule 1  . levofloxacin (LEVAQUIN) 500 MG tablet Take 1 tablet (500 mg total) by mouth daily. (Patient not taking: Reported on 09/23/2018) 7 tablet 0  . predniSONE (DELTASONE) 10 MG tablet Take 4 tablets daily for 5 days then stop (Patient not taking: Reported on 09/23/2018) 20 tablet 0   No facility-administered medications prior to visit.       Review of Systems  Constitutional: Positive for activity change and fatigue. Negative for appetite change and fever.  HENT: Negative.   Eyes: Positive for visual disturbance.  Respiratory: Positive for cough, sputum production, chest tightness, shortness of breath and wheezing. Negative for hemoptysis.   Cardiovascular: Positive for PND. Negative for chest pain, orthopnea and leg swelling.  Gastrointestinal: Negative.   Genitourinary: Negative.   Musculoskeletal: Positive for back pain, gait problem, myalgias, neck pain and neck stiffness.  Skin: Negative.   Neurological: Positive for dizziness, weakness, numbness and headaches. Negative for tremors, syncope, facial asymmetry, speech difficulty and light-headedness.   Hematological: Positive for adenopathy.  Psychiatric/Behavioral: Negative.        Objective:   Physical Exam Vitals:   09/23/18 1052  BP: 119/74  Pulse: 68  Resp: 16  Temp: 98.2 F (36.8 C)  TempSrc: Oral  SpO2: 94%  Weight: 127 lb 9.6 oz (57.9 kg)    Gen: Pleasant, thin  in no distress,  normal affect  ENT: No lesions,  mouth clear,  oropharynx clear, no postnasal drip  Neck: No JVD, no TMG, no carotid bruits  Lungs: No use of accessory muscles, no dullness to percussion, scattered rhonchi and expired wheezes with poor breath sounds , There is evidence for right axillary lymphadenopathy  cardiovascular: RRR, heart sounds normal, no murmur or gallops, no peripheral edema  Abdomen: soft and NT, no HSM,  BS normal  Musculoskeletal: No deformities, no cyanosis or clubbing  Neuro: alert, non focal  Skin: Warm, no lesions or rashes  CT of the chest was reviewed from August 2019 and showed scattered pulmonary nodules appear to be benign in nature     Assessment & Plan:  I personally [facility] as well as any outside material available during this office visit and agree with the  radiology impressions.   Lung nodules With lung nodules and evidence of right axillary lymphadenopathy I encouraged the patient to please follow-up with a CT of the chest which was previously ordered  Axillary lymphadenopathy Unchanged axillary lymphadenopathy again needs to follow-up with a CT of the chest  Lumbar radiculopathy Lumbar and cervical radiculopathy with pending MRIs again have encouraged the patient to keep his MRI appointments and further pain management will be per primary care  COPD with acute bronchitis (HCC) Chronic obstructive lung disease with gold stage C COPD  Plan is to repulse prednisone 40 mg daily for 5 days and encourage the use of Symbicort 2 puffs twice daily  I gave the patient a flutter valve today and taught him how to  use it and he is to use it with his nebulizer treatments  Also will ask for assistance on obtaining the Incruse at 1 puff daily   Junious was seen today for follow-up.  Diagnoses and all orders for this visit:  COPD with acute bronchitis ([place])  Centrilobular emphysema ([place]) -     predniSONE (DELTASONE) 10 MG tablet; Take 4 tablets daily for 5 days then stop  Lung nodules  Tobacco dependence  Chronic bilateral low back pain without sciatica  Axillary lymphadenopathy  Lumbar radiculopathy    I had an extended discussion with the patient and or family lasting 8 minutes of a 25 minute visit including: Smoking cessation counseling  Also have administered today to the patient a flu vaccine

## 2018-10-05 ENCOUNTER — Other Ambulatory Visit: Payer: Self-pay | Admitting: Internal Medicine

## 2018-10-05 DIAGNOSIS — J432 Centrilobular emphysema: Secondary | ICD-10-CM

## 2018-10-05 NOTE — Telephone Encounter (Signed)
Will route to cma  

## 2018-10-05 NOTE — Telephone Encounter (Signed)
New Message   1) Medication(s) Requested (by name):albuterol (VENTOLIN HFA) 108 (90 Base) MCG/ACT inhaler  2) Pharmacy of Choice: [facility]  in [place]  3) Special Requests:   Approved medications will be sent to the pharmacy, we will reach out if there is an issue.  Requests made after 3pm may not be addressed until the following business day!  If a patient is unsure of the name of the medication(s) please note and ask patient to call back when they are able to provide all info, do not send to responsible party until all information is available!

## 2018-10-13 MED FILL — MAVYRET 100-40 MG TABS: 100-40 | 28 days supply | Qty: 84 | Fill #1

## 2018-10-15 ENCOUNTER — Ambulatory Visit (HOSPITAL_COMMUNITY): Payer: Medicaid Other

## 2018-10-15 ENCOUNTER — Ambulatory Visit (HOSPITAL_COMMUNITY): Payer: Medicaid Other | Attending: Internal Medicine

## 2018-10-15 ENCOUNTER — Ambulatory Visit (HOSPITAL_COMMUNITY): Admission: RE | Admit: 2018-10-15 | Payer: Medicaid Other | Source: Ambulatory Visit

## 2018-10-19 ENCOUNTER — Ambulatory Visit: Payer: Medicaid Other | Admitting: Pharmacist

## 2018-11-04 ENCOUNTER — Ambulatory Visit: Payer: Medicaid Other | Admitting: Critical Care Medicine

## 2018-11-04 NOTE — Progress Notes (Deleted)
Subjective:    Patient ID: Corey Lawrence, male    DOB: 13-May-1955, 63 y.o.   MRN: [id number]  This is a 63 year old male who has advanced chronic obstructive lung disease.  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  The patient had been on previously [facility] and Incruse but because after obtaining Medicaid for and formulary changes he was switched back to Symbicort and Spiriva.   Note unfortunately he is only been using the Symbicort once a day, and has run out of the Incruse and needs a refill.  Also a flutter valve was ordered the last visit but he never picked this up.   The patient continues to smoke a pack a week of cigarettes.  He has been trying to cut back and is using nicotine replacement therapy for this.  The patient states his dyspnea is getting progressively worse and he has had coughing up thick yellow mucus.  The mucus is difficult to raise.    The patient also has had chronic low back pain that was exacerbated by a fall previously this summer from his porch.  He has an MRI of the spine that is pending and has not yet made the appointment for that that was scheduled.  He also has nodules in his chest which need follow-up from her previous CT August 2019.  Once again he failed to follow-up on the order for this scan.  The patient was asking for pain management from me but I indicated to him I am seeing him for his lungs that he should follow-up with his primary care provider regarding the pain management.  He states the gabapentin caused side effects and tramadol does not affect his pain.  He is firmly adamant he does not want higher dose opiates   He is in the hepatitis C clinic and had a recent ultrasound of the liver showing moderate risk for fibrosis but currently only hepatic steatosis and he is now starting antivirals for hepatitis C   11/04/2018 Copd f/u  Lung nodules With lung nodules and evidence of right axillary  lymphadenopathy I encouraged the patient to please follow-up with a CT of the chest which was previously ordered  Axillary lymphadenopathy Unchanged axillary lymphadenopathy again needs to follow-up with a CT of the chest  Lumbar radiculopathy Lumbar and cervical radiculopathy with pending MRIs again have encouraged the patient to keep his MRI appointments and further pain management will be per primary care  COPD with acute bronchitis (HCC) Chronic obstructive lung disease with gold stage C COPD  Plan is to repulse prednisone 40 mg daily for 5 days and encourage the use of Symbicort 2 puffs twice daily I gave the patient a flutter valve today and taught him how to use it and he is to use it with his nebulizer treatments  Also will ask for assistance on obtaining the Incruse at 1 puff daily    Shortness of Breath This is a chronic problem. The current episode started more than 1 year ago. The problem occurs constantly (any activity is dyspneic, walking or picking up an object ). The problem has been gradually worsening. Associated symptoms include headaches, neck pain, PND, sputum production and wheezing. Pertinent negatives include no chest pain, fever, hemoptysis, leg swelling or orthopnea. The symptoms are aggravated by lying flat, weather changes, emotional upset, exercise and any activity. Associated symptoms comments: Left arm numbness, mucus is green yellow. Risk factors include smoking.  He has tried steroid inhalers and beta agonist inhalers for the symptoms. His past medical history is significant for COPD and pneumonia. There is no history of CAD, a heart failure or PE.   Past Medical History:  Diagnosis Date  . Ankle fracture, left    "casted; no OR"  . Chronic lower back pain   . COPD (chronic obstructive pulmonary disease) (HCC)   . Tobacco abuse      Family History  Problem Relation Age of Onset  . Esophageal cancer Father   . Colon cancer Sister   . Cancer -  Other Brother   . Heart disease Mother   . Diabetes Mellitus II Mother   . Heart disease Other        multiple maternal family members  . Diabetes Mellitus II Other        multiple maternal family members     Social History   Socioeconomic History  . Marital status: Divorced    Spouse name: Not on file  . Number of children: Not on file  . Years of education: Not on file  . Highest education level: Not on file  Occupational History  . Not on file  Social Needs  . Financial resource strain: Not on file  . Food insecurity    Worry: Not on file    Inability: Not on file  . Transportation needs    Medical: Not on file    Non-medical: Not on file  Tobacco Use  . Smoking status: Current Every Day Smoker    Packs/day: 0.25    Years: 43.00    Pack years: 10.75    Types: Cigarettes  . Smokeless tobacco: Never Used  . Tobacco comment: currently on patch  Substance and Sexual Activity  . 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 Drug use: Yes    Comment: 09/21/2013 "used marijuana once when I was 20; used cocaine  in ~ 2000; stopped in ~ 2003"  . Sexual activity: Not Currently  Lifestyle  . Physical activity    Days per week: Not on file    Minutes per session: Not on file  . Stress: Not on file  Relationships  . Social Musicianconnections    Talks on phone: Not on file    Gets together: Not on file    Attends religious service: Not on file    Active member of club or organization: Not on file    Attends meetings of clubs or organizations: Not on file    Relationship status: Not on file  . Intimate partner violence    Fear of current or ex partner: Not on file    Emotionally abused: Not on file    Physically abused: Not on file    Forced sexual activity: Not on file  Other Topics Concern  . Not on file  Social History Narrative   Lives in trailer.  Not married.  5 children.       No Known Allergies   Outpatient  Medications Prior to Visit  Medication Sig Dispense Refill  . acetaminophen (TYLENOL) 325 MG tablet Take 2 tablets (650 mg total) by mouth every 6 (six) hours as needed for mild pain (or Fever >/= 101).    Marland Kitchen. albuterol (PROVENTIL) (2.5 MG/3ML) 0.083% nebulizer solution Take 3 mLs (2.5 mg total) by nebulization every 6 (six) hours as needed for wheezing or shortness of breath. 150 mL 6  .  albuterol (VENTOLIN HFA) 108 (90 Base) MCG/ACT inhaler inhale 1-2 PUFFS into THE lungs EVERY 6 HOURS AS NEEDED FOR WHEEZING OR SHORTNESS OF BREATH 36 g 0  . aspirin EC 81 MG tablet Take 1 tablet (81 mg total) by mouth daily. 100 tablet 1  . budesonide-formoterol (SYMBICORT) 160-4.5 MCG/ACT inhaler Inhale 2 puffs into the lungs 2 (two) times daily. 1 Inhaler 6  . diphenhydramine-acetaminophen (TYLENOL PM) 25-500 MG TABS tablet Take 1 tablet by mouth at bedtime as needed.    . Glecaprevir-Pibrentasvir (MAVYRET) 100-40 MG TABS Take 3 tablets by mouth daily with breakfast. 84 tablet 1  . Multiple Vitamins-Minerals (MULTIVITAMIN ADULTS 50+) TABS Take 1 tablet by mouth daily.    . nicotine polacrilex (COMMIT) 2 MG lozenge Take 1 lozenge (2 mg total) by mouth as needed for smoking cessation. 50 tablet 1  . predniSONE (DELTASONE) 10 MG tablet Take 4 tablets daily for 5 days then stop 20 tablet 0  . Respiratory Therapy Supplies (FLUTTER) DEVI Use 4 times daily 1 each 0  . traMADol (ULTRAM) 50 MG tablet Take 1 tablet (50 mg total) by mouth every 8 (eight) hours as needed. 40 tablet 0  . traZODone (DESYREL) 50 MG tablet Take 0.5-1 tablets (25-50 mg total) by mouth at bedtime as needed for sleep. 30 tablet 6  . umeclidinium bromide (INCRUSE ELLIPTA) 62.5 MCG/INH AEPB Inhale 1 puff into the lungs daily. 30 each 6   No facility-administered medications prior to visit.       Review of Systems  Constitutional: Positive for activity change and fatigue. Negative for appetite change and fever.  HENT: Negative.   Eyes: Positive  for visual disturbance.  Respiratory: Positive for cough, sputum production, chest tightness, shortness of breath and wheezing. Negative for hemoptysis.   Cardiovascular: Positive for PND. Negative for chest pain, orthopnea and leg swelling.  Gastrointestinal: Negative.   Genitourinary: Negative.   Musculoskeletal: Positive for back pain, gait problem, myalgias, neck pain and neck stiffness.  Skin: Negative.   Neurological: Positive for dizziness, weakness, numbness and headaches. Negative for tremors, syncope, facial asymmetry, speech difficulty and light-headedness.  Hematological: Positive for adenopathy.  Psychiatric/Behavioral: Negative.        Objective:   Physical Exam  There were no vitals filed for this visit.  Gen: Pleasant, thin  in no distress,  normal affect  ENT: No lesions,  mouth clear,  oropharynx clear, no postnasal drip  Neck: No JVD, no TMG, no carotid bruits  Lungs: No use of accessory muscles, no dullness to percussion, scattered rhonchi and expired wheezes with poor breath sounds , There is evidence for right axillary lymphadenopathy  cardiovascular: RRR, heart sounds normal, no murmur or gallops, no peripheral edema  Abdomen: soft and NT, no HSM,  BS normal  Musculoskeletal: No deformities, no cyanosis or clubbing  Neuro: alert, non focal  Skin: Warm, no lesions or rashes  CT of the chest was reviewed from August 2019 and showed scattered pulmonary nodules appear to be benign in nature     Assessment & Plan:  I personally reviewed all images and lab data in the [facility] system as well as any outside material available during this office visit and agree with the  radiology impressions.   No problem-specific Assessment & Plan notes found for this encounter.   There are no diagnoses linked to this encounter.  I had an extended discussion with the patient and or family lasting 8 minutes of a 25 minute visit including: Smoking cessation  counseling  Also  have administered today to the patient a flu vaccine

## 2018-12-01 ENCOUNTER — Telehealth: Payer: Self-pay | Admitting: Internal Medicine

## 2018-12-01 DIAGNOSIS — J432 Centrilobular emphysema: Secondary | ICD-10-CM

## 2018-12-01 NOTE — Telephone Encounter (Signed)
Patient called stating he is congested and SOB. Patient states that Dr. Joya Gaskins prescribed him Prednisone and an antibiotic. Patient would like to know if he can be prescribed the same medication. Please f/u

## 2018-12-02 MED ORDER — AZITHROMYCIN 250 MG PO TABS: 250.0000 mg | ORAL_TABLET | Freq: Every day | ORAL | 0 refills | Status: DC

## 2018-12-02 MED ORDER — PREDNISONE 10 MG PO TABS: ORAL_TABLET | ORAL | 0 refills | Status: DC

## 2018-12-02 NOTE — Telephone Encounter (Signed)
Spoke to patient and inform on Rx. Pt. Understood.

## 2018-12-02 NOTE — Addendum Note (Signed)
Addended by: Elsie Stain on: 12/02/2018 09:23 AM   Modules accepted: Orders

## 2018-12-02 NOTE — Telephone Encounter (Signed)
I will Rx prednisone and azithromycin to his pharmacy.  Rx just sent, pls let him know

## 2018-12-29 ENCOUNTER — Ambulatory Visit: Payer: Medicaid Other | Attending: Critical Care Medicine | Admitting: Critical Care Medicine

## 2018-12-29 ENCOUNTER — Encounter: Payer: Self-pay | Admitting: Critical Care Medicine

## 2018-12-29 ENCOUNTER — Other Ambulatory Visit: Payer: Self-pay

## 2018-12-29 VITALS — BP 135/87 | HR 89 | Temp 98.2°F | Resp 16 | Wt 133.4 lb

## 2018-12-29 DIAGNOSIS — F17200 Nicotine dependence, unspecified, uncomplicated: Secondary | ICD-10-CM

## 2018-12-29 DIAGNOSIS — Z7982 Long term (current) use of aspirin: Secondary | ICD-10-CM | POA: Insufficient documentation

## 2018-12-29 DIAGNOSIS — G8929 Other chronic pain: Secondary | ICD-10-CM | POA: Insufficient documentation

## 2018-12-29 DIAGNOSIS — J440 Chronic obstructive pulmonary disease with acute lower respiratory infection: Secondary | ICD-10-CM | POA: Diagnosis not present

## 2018-12-29 DIAGNOSIS — K760 Fatty (change of) liver, not elsewhere classified: Secondary | ICD-10-CM | POA: Diagnosis not present

## 2018-12-29 DIAGNOSIS — M545 Low back pain, unspecified: Secondary | ICD-10-CM

## 2018-12-29 DIAGNOSIS — R590 Localized enlarged lymph nodes: Secondary | ICD-10-CM | POA: Diagnosis not present

## 2018-12-29 DIAGNOSIS — F17210 Nicotine dependence, cigarettes, uncomplicated: Secondary | ICD-10-CM | POA: Diagnosis not present

## 2018-12-29 DIAGNOSIS — J209 Acute bronchitis, unspecified: Secondary | ICD-10-CM

## 2018-12-29 DIAGNOSIS — Z79899 Other long term (current) drug therapy: Secondary | ICD-10-CM | POA: Diagnosis not present

## 2018-12-29 DIAGNOSIS — R918 Other nonspecific abnormal finding of lung field: Secondary | ICD-10-CM

## 2018-12-29 DIAGNOSIS — J432 Centrilobular emphysema: Secondary | ICD-10-CM | POA: Diagnosis not present

## 2018-12-29 DIAGNOSIS — M5416 Radiculopathy, lumbar region: Secondary | ICD-10-CM | POA: Insufficient documentation

## 2018-12-29 MED ORDER — ALBUTEROL SULFATE HFA 108 (90 BASE) MCG/ACT IN AERS: INHALATION_SPRAY | RESPIRATORY_TRACT | 0 refills | Status: DC

## 2018-12-29 MED ORDER — INCRUSE ELLIPTA 62.5 MCG/INH IN AEPB: 1.0000 | INHALATION_SPRAY | Freq: Every day | RESPIRATORY_TRACT | 6 refills | Status: DC

## 2018-12-29 MED ORDER — BUDESONIDE-FORMOTEROL FUMARATE 160-4.5 MCG/ACT IN AERO: 2.0000 | INHALATION_SPRAY | Freq: Two times a day (BID) | RESPIRATORY_TRACT | 6 refills | Status: DC

## 2018-12-29 NOTE — Progress Notes (Signed)
Subjective:    Patient ID: Corey Lawrence, male    DOB: 1955/12/12, 63 y.o.   MRN: [id number]  This is a 63 year old male who has advanced chronic obstructive lung disease.  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  The patient had been on previously [facility] and Incruse but because after obtaining Medicaid for and formulary changes he was switched back to Symbicort and Spiriva.   Note unfortunately he is only been using the Symbicort once a day, and has run out of the Incruse and needs a refill.  Also a flutter valve was ordered the last visit but he never picked this up.   The patient continues to smoke a pack a week of cigarettes.  He has been trying to cut back and is using nicotine replacement therapy for this.  The patient states his dyspnea is getting progressively worse and he has had coughing up thick yellow mucus.  The mucus is difficult to raise.    The patient also has had chronic low back pain that was exacerbated by a fall previously this summer from his porch.  He has an MRI of the spine that is pending and has not yet made the appointment for that that was scheduled.  He also has nodules in his chest which need follow-up from her previous CT August 2019.  Once again he failed to follow-up on the order for this scan.  The patient was asking for pain management from me but I indicated to him I am seeing him for his lungs that he should follow-up with his primary care provider regarding the pain management.  He states the gabapentin caused side effects and tramadol does not affect his pain.  He is firmly adamant he does not want higher dose opiates   He is in the hepatitis C clinic and had a recent ultrasound of the liver showing moderate risk for fibrosis but currently only hepatic steatosis and he is now starting antivirals for hepatitis C  12/29/2018 This is a follow-up visit for a patient with history of COPD with gold stage C prior  smoking history history of lung nodules and right axillary lymphadenopathy.  Also the patient has chronic lumbar radiculopathy.  He had pending scans for the lung nodules and the neck and spine however he had not kept those appointments.  He has been traveling to the [place] part of the state to care for his mother who is ill.  The patient's breathing is at baseline.  He states he is improved and states the flutter valve has been helpful in secretion removal.  The patient does maintain the Symbicort and Incruse The patient is still smoking about 10 cigarettes daily   Shortness of Breath This is a chronic problem. The current episode started more than 1 year ago. The problem occurs daily (any activity is dyspneic, walking or picking up an object ). The problem has been gradually improving. Associated symptoms include neck pain and sputum production. Pertinent negatives include no chest pain, fever, headaches, hemoptysis, leg swelling, orthopnea, PND or wheezing. The symptoms are aggravated by lying flat, weather changes, emotional upset, exercise and any activity. Associated symptoms comments: Left arm numbness, mucus is green yellow. Risk factors include smoking. He has tried steroid inhalers and beta agonist inhalers for the symptoms. His past medical history is significant for COPD and pneumonia. There is no history of CAD, a heart failure or PE.   Past  Medical History:  Diagnosis Date  . Ankle fracture, left    "casted; no OR"  . Chronic lower back pain   . COPD (chronic obstructive pulmonary disease) (HCC)   . Tobacco abuse      Family History  Problem Relation Age of Onset  . Esophageal cancer Father   . Colon cancer Sister   . Cancer - Other Brother   . Heart disease Mother   . Diabetes Mellitus II Mother   . Heart disease Other        multiple maternal family members  . Diabetes Mellitus II Other        multiple maternal family members     Social History   Socioeconomic  History  . Marital status: Divorced    Spouse name: Not on file  . Number of children: Not on file  . Years of education: Not on file  . Highest education level: Not on file  Occupational History  . Not on file  Social Needs  . Financial resource strain: Not on file  . Food insecurity    Worry: Not on file    Inability: Not on file  . Transportation needs    Medical: Not on file    Non-medical: Not on file  Tobacco Use  . Smoking status: Current Every Day Smoker    Packs/day: 0.25    Years: 43.00    Pack years: 10.75    Types: Cigarettes  . Smokeless tobacco: Never Used  . Tobacco comment: currently on patch  Substance and Sexual Activity  . 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 Drug use: Yes    Comment: 09/21/2013 "used marijuana once when I was 20; used cocaine  in ~ 2000; stopped in ~ 2003"  . Sexual activity: Not Currently  Lifestyle  . Physical activity    Days per week: Not on file    Minutes per session: Not on file  . Stress: Not on file  Relationships  . Social Musician on phone: Not on file    Gets together: Not on file    Attends religious service: Not on file    Active member of club or organization: Not on file    Attends meetings of clubs or organizations: Not on file    Relationship status: Not on file  . Intimate partner violence    Fear of current or ex partner: Not on file    Emotionally abused: Not on file    Physically abused: Not on file    Forced sexual activity: Not on file  Other Topics Concern  . Not on file  Social History Narrative   Lives in trailer.  Not married.  5 children.       No Known Allergies   Outpatient Medications Prior to Visit  Medication Sig Dispense Refill  . acetaminophen (TYLENOL) 325 MG tablet Take 2 tablets (650 mg total) by mouth every 6 (six) hours as needed for mild pain (or Fever >/= 101).    Marland Kitchen albuterol (PROVENTIL) (2.5 MG/3ML)  0.083% nebulizer solution Take 3 mLs (2.5 mg total) by nebulization every 6 (six) hours as needed for wheezing or shortness of breath. 150 mL 6  . aspirin EC 81 MG tablet Take 1 tablet (81 mg total) by mouth daily. 100 tablet 1  . diphenhydramine-acetaminophen (TYLENOL PM) 25-500 MG TABS tablet Take 1 tablet by mouth at bedtime as needed.    Marland Kitchen  Glecaprevir-Pibrentasvir (MAVYRET) 100-40 MG TABS Take 3 tablets by mouth daily with breakfast. 84 tablet 1  . Multiple Vitamins-Minerals (MULTIVITAMIN ADULTS 50+) TABS Take 1 tablet by mouth daily.    . nicotine polacrilex (COMMIT) 2 MG lozenge Take 1 lozenge (2 mg total) by mouth as needed for smoking cessation. 50 tablet 1  . Respiratory Therapy Supplies (FLUTTER) DEVI Use 4 times daily 1 each 0  . traMADol (ULTRAM) 50 MG tablet Take 1 tablet (50 mg total) by mouth every 8 (eight) hours as needed. 40 tablet 0  . traZODone (DESYREL) 50 MG tablet Take 0.5-1 tablets (25-50 mg total) by mouth at bedtime as needed for sleep. 30 tablet 6  . albuterol (VENTOLIN HFA) 108 (90 Base) MCG/ACT inhaler inhale 1-2 PUFFS into THE lungs EVERY 6 HOURS AS NEEDED FOR WHEEZING OR SHORTNESS OF BREATH 36 g 0  . azithromycin (ZITHROMAX) 250 MG tablet Take 1 tablet (250 mg total) by mouth daily. Take two once then one daily until gone (Patient not taking: Reported on 12/29/2018) 6 tablet 0  . budesonide-formoterol (SYMBICORT) 160-4.5 MCG/ACT inhaler Inhale 2 puffs into the lungs 2 (two) times daily. 1 Inhaler 6  . predniSONE (DELTASONE) 10 MG tablet Take 4 tablets daily for 5 days then stop (Patient not taking: Reported on 12/29/2018) 20 tablet 0  . umeclidinium bromide (INCRUSE ELLIPTA) 62.5 MCG/INH AEPB Inhale 1 puff into the lungs daily. 30 each 6   No facility-administered medications prior to visit.       Review of Systems  Constitutional: Positive for activity change and fatigue. Negative for appetite change and fever.  HENT: Negative.   Eyes: Positive for visual  disturbance.  Respiratory: Positive for cough, sputum production, chest tightness and shortness of breath. Negative for hemoptysis and wheezing.   Cardiovascular: Negative for chest pain, orthopnea, leg swelling and PND.  Gastrointestinal: Negative.   Genitourinary: Negative.   Musculoskeletal: Positive for back pain, gait problem, myalgias, neck pain and neck stiffness.  Skin: Negative.   Neurological: Positive for dizziness, weakness and numbness. Negative for tremors, syncope, facial asymmetry, speech difficulty, light-headedness and headaches.  Hematological: Positive for adenopathy.  Psychiatric/Behavioral: Negative.        Objective:   Physical Exam Vitals:   12/29/18 1506  BP: 135/87  Pulse: 89  Resp: 16  Temp: 98.2 F (36.8 C)  TempSrc: Oral  SpO2: 95%  Weight: 133 lb 6.4 oz (60.5 kg)    Gen: Pleasant, thin  in no distress,  normal affect  ENT: No lesions,  mouth clear,  oropharynx clear, no postnasal drip  Neck: No JVD, no TMG, no carotid bruits  Lungs: No use of accessory muscles, no dullness to percussion, distant breath sounds without evidence of active wheezing There is evidence for right axillary lymphadenopathy  cardiovascular: RRR, heart sounds normal, no murmur or gallops, no peripheral edema  Abdomen: soft and NT, no HSM,  BS normal  Musculoskeletal: No deformities, no cyanosis or clubbing  Neuro: alert, non focal  Skin: Warm, no lesions or rashes  CT of the chest was reviewed from August 2019 and showed scattered pulmonary nodules appear to be benign in nature     Assessment & Plan:  I personally reviewed all images and lab data in the [facility] system as well as any outside material available during this office visit and agree with the  radiology impressions.   Axillary lymphadenopathy There is still lymphadenopathy noted in the right axilla  We need to follow-up with a repeat  CT of the chest and I have asked the medical assistant to get the  studies rescheduled  COPD with acute bronchitis (HCC) 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with reduction in tobacco use  The patient will maintain inhaled medications as prescribed  Chronic low back pain We will get the MRIs rescheduled I believe there was a prior authorization block as the patient has Medicaid  Lung nodules The lung nodules need to be followed up again as well with repeat CTs of chest  Tobacco dependence    . Current smoking consumption amount: 10 cigarettes daily  . Dicsussion on advise to quit smoking and smoking impacts: The patient understands he is at risk for cancer development and progression of his lung disease  . Patient's willingness to quit: The patient is under a lot of stress I am not sure he will completely quit smoking but he is trying to make an effort  . Methods to quit smoking discussed: Use of the nicotine patch  . Medication management of smoking session drugs discussed: Continued use of nicotine patch  . Resources provided:  AVS   . Setting quit date new year quit date  . Follow-up arranged 2932-month follow-up   Time spent counseling the patient: 10 minutes    Onalee HuaDavid was seen today for copd.  Diagnoses and all orders for this visit:  Tobacco dependence  Centrilobular emphysema (HCC) -     albuterol (VENTOLIN HFA) 108 (90 Base) MCG/ACT inhaler; inhale 1-2 PUFFS into THE lungs EVERY 6 HOURS AS NEEDED FOR WHEEZING OR SHORTNESS OF BREATH -     budesonide-formoterol (SYMBICORT) 160-4.5 MCG/ACT inhaler; Inhale 2 puffs into the lungs 2 (two) times daily.  Lung nodules  Axillary lymphadenopathy  COPD with acute bronchitis (HCC)  Chronic bilateral low back pain without sciatica  Other orders -     umeclidinium bromide (INCRUSE ELLIPTA) 62.5 MCG/INH AEPB; Inhale 1 puff into the lungs daily.

## 2018-12-29 NOTE — Patient Instructions (Signed)
All of your inhalers were refilled to our pharmacy  We will get your CT of your chest and the MRI of your neck and back in the ultrasound of your neck rescheduled  Return to see Dr. Joya Gaskins in follow-up in 4 months

## 2018-12-30 ENCOUNTER — Encounter: Payer: Self-pay | Admitting: Critical Care Medicine

## 2018-12-30 MED FILL — ALBUTEROL SULFATE HFA 108 (: 108 (90 BAS | 25 days supply | Qty: 18 | Fill #0

## 2018-12-30 MED FILL — SYMBICORT 160-4.5 MCG INH: 160-4.5 | 30 days supply | Qty: 10 | Fill #0

## 2018-12-30 NOTE — Assessment & Plan Note (Signed)
We will get the MRIs rescheduled I believe there was a prior authorization block as the patient has Medicaid

## 2018-12-30 NOTE — Assessment & Plan Note (Signed)
There is still lymphadenopathy noted in the right axilla  We need to follow-up with a repeat CT of the chest and I have asked the medical assistant to get the studies rescheduled

## 2018-12-30 NOTE — Assessment & Plan Note (Signed)
The lung nodules need to be followed up again as well with repeat CTs of chest

## 2018-12-30 NOTE — Assessment & Plan Note (Signed)
  .   Current smoking consumption amount: 10 cigarettes daily  . Dicsussion on advise to quit smoking and smoking impacts: The patient understands he is at risk for cancer development and progression of his lung disease  . Patient's willingness to quit: The patient is under a lot of stress I am not sure he will completely quit smoking but he is trying to make an effort  . Methods to quit smoking discussed: Use of the nicotine patch  . Medication management of smoking session drugs discussed: Continued use of nicotine patch  . Resources provided:  AVS   . Setting quit date new year quit date  . Follow-up arranged 68-month follow-up   Time spent counseling the patient: 10 minutes

## 2018-12-30 NOTE — Assessment & Plan Note (Signed)
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with reduction in tobacco use  The patient will maintain inhaled medications as prescribed

## 2019-01-08 ENCOUNTER — Telehealth: Payer: Self-pay | Admitting: *Deleted

## 2019-01-08 NOTE — Telephone Encounter (Signed)
Called [employer] and PA for patient CT of the chest nodule was approved and Approval number is [id number] and is good from 01-08-19 until June 2,2021. Same CPT code from June 2020.  Called patient and informed him with his CT and MRI dates that he is scheduled and patient verbalized understanding. Patient did agree to go to appt.   Jan 25, 2019 arriving at 2:45 at [facility].

## 2019-01-25 ENCOUNTER — Ambulatory Visit (HOSPITAL_COMMUNITY)
Admission: RE | Admit: 2019-01-25 | Discharge: 2019-01-25 | Disposition: A | Discharge status: Home / Self Care | Payer: Medicaid Other | Source: Ambulatory Visit | Attending: Internal Medicine | Admitting: Internal Medicine

## 2019-01-25 ENCOUNTER — Other Ambulatory Visit: Payer: Self-pay

## 2019-01-25 DIAGNOSIS — R918 Other nonspecific abnormal finding of lung field: Secondary | ICD-10-CM | POA: Diagnosis not present

## 2019-01-25 DIAGNOSIS — M5412 Radiculopathy, cervical region: Secondary | ICD-10-CM | POA: Insufficient documentation

## 2019-01-25 DIAGNOSIS — M5126 Other intervertebral disc displacement, lumbar region: Secondary | ICD-10-CM | POA: Diagnosis not present

## 2019-01-25 DIAGNOSIS — M542 Cervicalgia: Secondary | ICD-10-CM | POA: Diagnosis not present

## 2019-01-25 DIAGNOSIS — M5416 Radiculopathy, lumbar region: Secondary | ICD-10-CM | POA: Insufficient documentation

## 2019-01-26 ENCOUNTER — Telehealth: Payer: Self-pay | Admitting: Internal Medicine

## 2019-01-26 DIAGNOSIS — I2510 Atherosclerotic heart disease of native coronary artery without angina pectoris: Secondary | ICD-10-CM | POA: Insufficient documentation

## 2019-01-26 DIAGNOSIS — M5416 Radiculopathy, lumbar region: Secondary | ICD-10-CM

## 2019-01-26 DIAGNOSIS — M5412 Radiculopathy, cervical region: Secondary | ICD-10-CM

## 2019-01-26 DIAGNOSIS — B182 Chronic viral hepatitis C: Secondary | ICD-10-CM

## 2019-01-26 NOTE — Telephone Encounter (Signed)
PC placed to pt this afternoon.  Pt informed that CT scan of chest showed 3 new spots in LT lower lung largest of which is 90mm.  Other previous BL nodules are stable in size.  No axillary LN noted. Because pt is high risk for lung CA due to years of smoking, recommend repeat CT scan in 6 mths.  Pt advised that I will also let Dr. Joya Gaskins know so that he too can take a look at the reports. Encouraged to quit smoking.  Incidental finding of coronary atherosclerosis.  I recommend taking a baby ASA daily. Pt states he does.  Would also benefit from being on statin but I would like for him to return to lab to have LFTs recheck and lipid profile.  Completed treatment for Hep C 2 mths ago. 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  MRI of lumbar spine revealed bulging disc at L2-3 more so on LT side. Pt states he still gets pain down LT leg.  I recommend referral to neurosurgeon for his cervical and lumbar radiculopathy.  Pt agreeable to this.

## 2019-02-01 ENCOUNTER — Other Ambulatory Visit: Payer: Self-pay | Admitting: Internal Medicine

## 2019-02-01 DIAGNOSIS — J432 Centrilobular emphysema: Secondary | ICD-10-CM

## 2019-02-22 DIAGNOSIS — Z03818 Encounter for observation for suspected exposure to other biological agents ruled out: Secondary | ICD-10-CM | POA: Diagnosis not present

## 2019-02-23 DIAGNOSIS — R030 Elevated blood-pressure reading, without diagnosis of hypertension: Secondary | ICD-10-CM | POA: Insufficient documentation

## 2019-02-23 DIAGNOSIS — M5442 Lumbago with sciatica, left side: Secondary | ICD-10-CM | POA: Diagnosis not present

## 2019-02-23 DIAGNOSIS — M4722 Other spondylosis with radiculopathy, cervical region: Secondary | ICD-10-CM | POA: Diagnosis not present

## 2019-03-13 DIAGNOSIS — J439 Emphysema, unspecified: Secondary | ICD-10-CM | POA: Diagnosis not present

## 2019-03-13 DIAGNOSIS — J168 Pneumonia due to other specified infectious organisms: Secondary | ICD-10-CM | POA: Diagnosis not present

## 2019-03-13 DIAGNOSIS — R918 Other nonspecific abnormal finding of lung field: Secondary | ICD-10-CM | POA: Diagnosis not present

## 2019-03-13 DIAGNOSIS — Z20822 Contact with and (suspected) exposure to covid-19: Secondary | ICD-10-CM | POA: Diagnosis not present

## 2019-08-04 MED FILL — SYMBICORT 160-4.5 MCG INH: 160-4.5 | 30 days supply | Qty: 10 | Fill #1

## 2019-08-12 ENCOUNTER — Other Ambulatory Visit: Payer: Self-pay

## 2019-08-12 ENCOUNTER — Encounter: Payer: Self-pay | Admitting: Internal Medicine

## 2019-08-12 ENCOUNTER — Ambulatory Visit: Payer: Medicaid Other | Attending: Internal Medicine | Admitting: Internal Medicine

## 2019-08-12 VITALS — BP 111/67 | HR 66 | Temp 99.3°F | Resp 16 | Wt 124.2 lb

## 2019-08-12 DIAGNOSIS — M5412 Radiculopathy, cervical region: Secondary | ICD-10-CM

## 2019-08-12 DIAGNOSIS — I700 Atherosclerosis of aorta: Secondary | ICD-10-CM

## 2019-08-12 DIAGNOSIS — J440 Chronic obstructive pulmonary disease with acute lower respiratory infection: Secondary | ICD-10-CM

## 2019-08-12 DIAGNOSIS — M5416 Radiculopathy, lumbar region: Secondary | ICD-10-CM | POA: Diagnosis not present

## 2019-08-12 DIAGNOSIS — F17200 Nicotine dependence, unspecified, uncomplicated: Secondary | ICD-10-CM

## 2019-08-12 DIAGNOSIS — R918 Other nonspecific abnormal finding of lung field: Secondary | ICD-10-CM

## 2019-08-12 DIAGNOSIS — I2510 Atherosclerotic heart disease of native coronary artery without angina pectoris: Secondary | ICD-10-CM

## 2019-08-12 DIAGNOSIS — B182 Chronic viral hepatitis C: Secondary | ICD-10-CM

## 2019-08-12 DIAGNOSIS — J209 Acute bronchitis, unspecified: Secondary | ICD-10-CM

## 2019-08-12 MED ORDER — AZITHROMYCIN 250 MG PO TABS: ORAL_TABLET | ORAL | 0 refills | Status: DC

## 2019-08-12 MED ORDER — PREDNISONE 10 MG PO TABS: ORAL_TABLET | ORAL | 0 refills | Status: DC

## 2019-08-12 NOTE — Progress Notes (Signed)
Patient ID: Corey Lawrence, male    DOB: 17-Aug-1955  MRN: [id number]  CC: COPD   Subjective: Corey Lawrence is a 64 y.o. male who presents for chronic disease management His concerns today include:  COPD,lung nodules, Hep C,tob dep, chronic LBP and anxiety disorder.  I last saw him 07/2018.  At that time he was complaining of radicular pain in the lower back radiating to the left leg after he had fell off a porch.  He also complained of pain in the neck radiating to the shoulders.  MRI was done cervical radiculopathy.  This revealed some facet edema of C2-3 and moderate right-sided foraminal stenosis at C5-6.  MRI of the lumbar spine revealed bulging disks at L2-3 more on the left side.  Patient was referred to a neurosurgeon.  He saw the surgeon and states he was told nothing surgical at this time.  He would like to be referred to [facility] pain clinic as he still has ongoing issues with pain.  He tells me that some days he takes up to 12 500 mg Tylenol a day.  COPD: taking inhalers but feels he is currently having a flareup.  Has a hard time getting phlegm up but when he does it is discolored.  He would like some antibiotics and prednisone.  He has not had the Covid vaccine and states he does not want it.  He is still smoking but states he has decreased.  He is now at 3 to 4 cigarettes/day.  He gets winded very easily with routine ADLs. CT scan of the chest done in December showed 3 small spots in the left lower lung the largest of which was 7 mm.  Other previous bilateral nodules were stable in size.  It was recommended that he has repeat study in 6 months to follow these nodules.  Incidental finding on the CAT scan also showed atherosclerosis in 2 coronary vessels.  He has completed treatment for hepatitis C.  Patient Active Problem List   Diagnosis Date Noted  . Coronary arteriosclerosis 01/26/2019  . Axillary lymphadenopathy 08/12/2018  . Centrilobular emphysema (HCC) 08/12/2018  .  Lumbar radiculopathy 08/12/2018  . Cervical radiculopathy 08/12/2018  . Urinary hesitancy 02/09/2018  . Lung nodules 09/12/2017  . Tobacco dependence 09/12/2017  . Hep C w/o coma, chronic (HCC) 09/12/2017  . Generalized anxiety disorder 05/22/2016  . Prediabetes 09/29/2013  . Chronic low back pain 09/29/2013  . COPD with acute bronchitis (HCC) 09/21/2013     Current Outpatient Medications on File Prior to Visit  Medication Sig Dispense Refill  . acetaminophen (TYLENOL) 325 MG tablet Take 2 tablets (650 mg total) by mouth every 6 (six) hours as needed for mild pain (or Fever >/= 101).    Marland Kitchen albuterol (PROVENTIL) (2.5 MG/3ML) 0.083% nebulizer solution Take 3 mLs (2.5 mg total) by nebulization every 6 (six) hours as needed for wheezing or shortness of breath. 150 mL 6  . albuterol (VENTOLIN HFA) 108 (90 Base) MCG/ACT inhaler inhale 1-2 PUFFS into THE lungs EVERY 6 HOURS AS NEEDED FOR WHEEZING OR SHORTNESS OF BREATH 36 g 0  . aspirin EC 81 MG tablet Take 1 tablet (81 mg total) by mouth daily. 100 tablet 1  . budesonide-formoterol (SYMBICORT) 160-4.5 MCG/ACT inhaler Inhale 2 puffs into the lungs 2 (two) times daily. 1 Inhaler 6  . diphenhydramine-acetaminophen (TYLENOL PM) 25-500 MG TABS tablet Take 1 tablet by mouth at bedtime as needed.    . Glecaprevir-Pibrentasvir (MAVYRET) 100-40 MG TABS  Take 3 tablets by mouth daily with breakfast. 84 tablet 1  . Multiple Vitamins-Minerals (MULTIVITAMIN ADULTS 50+) TABS Take 1 tablet by mouth daily.    . nicotine polacrilex (COMMIT) 2 MG lozenge Take 1 lozenge (2 mg total) by mouth as needed for smoking cessation. 50 tablet 1  . Respiratory Therapy Supplies (FLUTTER) DEVI Use 4 times daily 1 each 0  . traMADol (ULTRAM) 50 MG tablet Take 1 tablet (50 mg total) by mouth every 8 (eight) hours as needed. 40 tablet 0  . traZODone (DESYREL) 50 MG tablet Take 0.5-1 tablets (25-50 mg total) by mouth at bedtime as needed for sleep. 30 tablet 6  . umeclidinium  bromide (INCRUSE ELLIPTA) 62.5 MCG/INH AEPB Inhale 1 puff into the lungs daily. 30 each 6   No current facility-administered medications on file prior to visit.    No Known Allergies  Social History   Socioeconomic History  . Marital status: Divorced    Spouse name: Not on file  . Number of children: Not on file  . Years of education: Not on file  . Highest education level: Not on file  Occupational History  . Not on file  Tobacco Use  . Smoking status: Current Every Day Smoker    Packs/day: 0.25    Years: 43.00    Pack years: 10.75    Types: Cigarettes  . Smokeless tobacco: Never Used  . Tobacco comment: currently on patch  Substance and Sexual Activity  . 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 Drug use: Yes    Comment: 09/21/2013 "used marijuana once when I was 20; used cocaine  in ~ 2000; stopped in ~ 2003"  . Sexual activity: Not Currently  Other Topics Concern  . Not on file  Social History Narrative   Lives in trailer.  Not married.  5 children.     Social Determinants of Health   Financial Resource Strain:   . Difficulty of Paying Living Expenses:   Food Insecurity:   . Worried About Programme researcher, broadcasting/film/video in the Last Year:   . Barista in the Last Year:   Transportation Needs:   . Freight forwarder (Medical):   Marland Kitchen Lack of Transportation (Non-Medical):   Physical Activity:   . Days of Exercise per Week:   . Minutes of Exercise per Session:   Stress:   . Feeling of Stress :   Social Connections:   . Frequency of Communication with Friends and Family:   . Frequency of Social Gatherings with Friends and Family:   . Attends Religious Services:   . Active Member of Clubs or Organizations:   . Attends Banker Meetings:   Marland Kitchen Marital Status:   Intimate Partner Violence:   . Fear of Current or Ex-Partner:   . Emotionally Abused:   Marland Kitchen Physically Abused:   . Sexually Abused:      Family History  Problem Relation Age of Onset  . Esophageal cancer Father   . Colon cancer Sister   . Cancer - Other Brother   . Heart disease Mother   . Diabetes Mellitus II Mother   . Heart disease Other        multiple maternal family members  . Diabetes Mellitus II Other        multiple maternal family members    Past Surgical History:  Procedure Laterality Date  . INCISION AND DRAINAGE OF  WOUND Left ~ 1977   "splinter got infected between" digits 2 & 3    ROS: Review of Systems Negative except as stated above  PHYSICAL EXAM: BP 111/67   Pulse 66   Temp 99.3 F (37.4 C)   Resp 16   Wt 124 lb 3.2 oz (56.3 kg)   SpO2 94%   BMI 20.99 kg/m   Physical Exam  General appearance - alert, well appearing, and in no distress Mental status - normal mood, behavior, speech, dress, motor activity, and thought processes Mouth - mucous membranes moist, pharynx normal without lesions Neck - supple, no significant adenopathy Chest -diffuse expiratory wheezes.   Heart - normal rate, regular rhythm, normal S1, S2, no murmurs, rubs, clicks or gallops Extremities -no lower extremity edema  CMP Latest Ref Rng & Units 08/27/2018 02/09/2018 02/09/2018  Glucose 65 - 99 mg/dL 401(U) 88 -  BUN 7 - 25 mg/dL 21 10 -  Creatinine 2.72 - 1.25 mg/dL 5.36 6.44 -  Sodium 034 - 146 mmol/L 140 139 -  Potassium 3.5 - 5.3 mmol/L 4.0 3.7 -  Chloride 98 - 110 mmol/L 103 103 -  CO2 20 - 32 mmol/L 27 28 -  Calcium 8.6 - 10.3 mg/dL 9.5 9.7 -  Total Protein 6.1 - 8.1 g/dL 7.8 7.0 -  Total Bilirubin 0.2 - 1.2 mg/dL 0.6 0.6 -  Alkaline Phos 39 - 117 IU/L - - -  AST 10 - 35 U/L 103(H) 64(H) -  ALT 9 - 46 U/L 99(H) 57(H) 60(H)   Lipid Panel     Component Value Date/Time   CHOL 153 07/22/2017 1040   TRIG 76 07/22/2017 1040   HDL 37 (L) 07/22/2017 1040   CHOLHDL 4.1 07/22/2017 1040   CHOLHDL 3.6 12/05/2014 1214   VLDL 22 12/05/2014 1214   LDLCALC 101 (H) 07/22/2017 1040    CBC    Component  Value Date/Time   WBC 3.1 (L) 08/27/2018 1606   RBC 4.44 08/27/2018 1606   HGB 14.3 08/27/2018 1606   HGB 14.7 07/22/2017 1040   HCT 41.2 08/27/2018 1606   HCT 43.4 07/22/2017 1040   PLT 193 08/27/2018 1606   PLT 252 07/22/2017 1040   MCV 92.8 08/27/2018 1606   MCV 94 07/22/2017 1040   MCH 32.2 08/27/2018 1606   MCHC 34.7 08/27/2018 1606   RDW 12.0 08/27/2018 1606   RDW 14.0 07/22/2017 1040   LYMPHSABS 0.8 04/04/2017 0739   LYMPHSABS 2.4 08/14/2016 1630   MONOABS 1.0 04/04/2017 0739   EOSABS 0.1 04/04/2017 0739   EOSABS 0.7 (H) 08/14/2016 1630   BASOSABS 0.0 04/04/2017 0739   BASOSABS 0.1 08/14/2016 1630    ASSESSMENT AND PLAN: 1. Lumbar radiculopathy Advised to cut back on the amount of Tylenol that he is taking a day.  Should take no more than 6 Tylenol 500 mg a day.  Advised that taking too much Tylenol can cause damage to his liver - Ambulatory referral to Pain Clinic  2. Cervical radiculopathy - Ambulatory referral to Pain Clinic  3. Tobacco dependence Strongly advised to quit.  Patient states that he is cutting back but not ready to give a trial of quitting.  Less than 5 minutes spent on counseling.  4. COPD with acute bronchitis (HCC) Continue current inhalers - predniSONE (DELTASONE) 10 MG tablet; 4 tabs daily x 1 day then 3 tabs daily x 2 days then 2 tabs x 2 days then 1 tab x 1 day  Dispense: 15 tablet;  Refill: 0 - azithromycin (ZITHROMAX Z-PAK) 250 MG tablet; 2 tabs p.o. daily x1 day then 1 tablet daily.  Dispense: 6 each; Refill: 0 - CBC - Comprehensive metabolic panel  5. Aortic atherosclerosis (HCC) He is currently on aspirin.  Will check LFTs and if okay start him on Lipitor - Ambulatory referral to Cardiology - Lipid panel  6. Atherosclerosis of native coronary artery of native heart without angina pectoris See #5 above  7. Lung nodules - CT CHEST NODULE FOLLOW UP LOW DOSE W/O; Future  8. Hep C w/o coma, chronic (HCC) We will check to see if he  has SVR - HCV RNA quant; Future   Patient was given the opportunity to ask questions.  Patient verbalized understanding of the plan and was able to repeat key elements of the plan.   No orders of the defined types were placed in this encounter.    Requested Prescriptions    No prescriptions requested or ordered in this encounter    No follow-ups on file.  Jonah Blueeborah Helvi Royals, MD, FACP

## 2019-08-18 ENCOUNTER — Other Ambulatory Visit: Payer: Self-pay | Admitting: Internal Medicine

## 2019-08-18 DIAGNOSIS — J432 Centrilobular emphysema: Secondary | ICD-10-CM

## 2019-08-18 MED ORDER — BUDESONIDE-FORMOTEROL FUMARATE 160-4.5 MCG/ACT IN AERO: 2.0000 | INHALATION_SPRAY | Freq: Two times a day (BID) | RESPIRATORY_TRACT | 6 refills | Status: DC

## 2019-08-18 NOTE — Telephone Encounter (Signed)
Medication Refill - Medication: budesonide-formoterol (SYMBICORT) 160-4.5 MCG/ACT inhaler    Preferred Pharmacy (with phone number or street name): [facility] [place], [place] - [address] Phone:  [phone]  Fax:  [phone]       Agent: Please be advised that RX refills may take up to 3 business days. We ask that you follow-up with your pharmacy.

## 2019-08-20 ENCOUNTER — Other Ambulatory Visit: Payer: Medicaid Other

## 2019-08-24 ENCOUNTER — Encounter: Payer: Self-pay | Admitting: General Practice

## 2019-08-27 ENCOUNTER — Ambulatory Visit (HOSPITAL_COMMUNITY): Payer: Medicaid Other

## 2019-09-10 ENCOUNTER — Ambulatory Visit (HOSPITAL_COMMUNITY): Payer: Medicaid Other

## 2019-09-13 ENCOUNTER — Telehealth: Payer: Self-pay

## 2019-09-13 NOTE — Telephone Encounter (Signed)
Called [employer] for Peer to Peer meting spoke with [place] transferred call call at [phone]. Was on hold for more than 45 min/ unable to speak with a provider / will call back tomorrow during clinic hr.

## 2019-09-19 ENCOUNTER — Telehealth: Payer: Self-pay | Admitting: Internal Medicine

## 2019-09-19 DIAGNOSIS — R918 Other nonspecific abnormal finding of lung field: Secondary | ICD-10-CM

## 2019-09-19 NOTE — Telephone Encounter (Signed)
I did a peer to peer conversation with a contracted physician for pt's insurance company to try to get approval for low dose CT chest to f/u on lung nodules as recommended based on results of lasr CT done 01/2020.  He told me that on review of pt's chart, repeat LDCT not indicated.  May consider biopsy.  Informed him that pt is being followed by pulmonary who is aware of pt's condition.  He then told me he would approve for a HRCT scan instead, with CPT code 29574.  Approval number is [id number].  Good until 10/18/2019.

## 2019-09-21 ENCOUNTER — Ambulatory Visit: Payer: Medicaid Other | Attending: Internal Medicine

## 2019-09-21 DIAGNOSIS — Z23 Encounter for immunization: Secondary | ICD-10-CM

## 2019-09-21 NOTE — Progress Notes (Signed)
   Covid-19 Vaccination Clinic  Name:  Corey Lawrence    MRN: [id number] DOB: October 08, 1955  09/21/2019  Corey Lawrence was observed post Covid-19 immunization for 15 minutes without incident. He was provided with Vaccine Information Sheet and instruction to access the V-Safe system.   Corey Lawrence was instructed to call 911 with any severe reactions post vaccine: Marland Kitchen Difficulty breathing  . Swelling of face and throat  . A fast heartbeat  . A bad rash all over body  . Dizziness and weakness   Immunizations Administered    Name Date Dose VIS Date Route   Pfizer COVID-19 Vaccine 09/21/2019  2:40 PM 0.3 mL 03/31/2018 Intramuscular   Manufacturer: [employer], [employer]   Lot: [id number]   NDC: [address]-0

## 2019-09-21 NOTE — Telephone Encounter (Signed)
Contacted pt and lvm asking pt to give a call back to go over appt info for CT  CT is scheduled for October 01, 2019 at [facility] at 2:30pm. Pt will need to arrive at 2:15pm.

## 2019-09-22 NOTE — Telephone Encounter (Signed)
Phone call placed to patient today.  I left a voicemail message informing him of the appointment date and time for the CAT scan of his chest.

## 2019-09-30 ENCOUNTER — Ambulatory Visit (HOSPITAL_COMMUNITY): Payer: Medicaid Other

## 2019-10-01 ENCOUNTER — Ambulatory Visit (HOSPITAL_COMMUNITY): Payer: Medicaid Other

## 2019-10-12 ENCOUNTER — Ambulatory Visit: Payer: Medicaid Other

## 2019-11-19 ENCOUNTER — Other Ambulatory Visit: Payer: Self-pay | Admitting: Internal Medicine

## 2019-11-19 DIAGNOSIS — J440 Chronic obstructive pulmonary disease with acute lower respiratory infection: Secondary | ICD-10-CM

## 2019-11-19 DIAGNOSIS — J432 Centrilobular emphysema: Secondary | ICD-10-CM

## 2019-11-19 DIAGNOSIS — J209 Acute bronchitis, unspecified: Secondary | ICD-10-CM

## 2019-11-19 MED ORDER — PREDNISONE 10 MG PO TABS: ORAL_TABLET | ORAL | 0 refills | Status: DC

## 2019-11-19 MED ORDER — BUDESONIDE-FORMOTEROL FUMARATE 160-4.5 MCG/ACT IN AERO: 2.0000 | INHALATION_SPRAY | Freq: Two times a day (BID) | RESPIRATORY_TRACT | 2 refills | Status: DC

## 2019-11-19 MED ORDER — ALBUTEROL SULFATE (2.5 MG/3ML) 0.083% IN NEBU: 2.5000 mg | INHALATION_SOLUTION | Freq: Four times a day (QID) | RESPIRATORY_TRACT | 2 refills | Status: DC | PRN

## 2019-11-19 MED ORDER — ALBUTEROL SULFATE HFA 108 (90 BASE) MCG/ACT IN AERS: 1.0000 | INHALATION_SPRAY | Freq: Four times a day (QID) | RESPIRATORY_TRACT | 2 refills | Status: DC | PRN

## 2019-11-19 NOTE — Telephone Encounter (Signed)
Medication Refill - Medication: albuterol (PROVENTIL) (2.5 MG/3ML) 0.083% nebulizer solution  albuterol (VENTOLIN HFA) 108 (90 Base) MCG/ACT inhaler   predniSONE (DELTASONE) 10 MG tablet     budesonide-formoterol (SYMBICORT) 160-4.5 MCG/ACT inhaler This one not needed until December    Send to  [id number] - [place], [place] - [address] AT [facility] OF [address] Phone:  [phone]  Fax:  [phone]     Please call Pt when sent and if he doesn't answer due to hearing please send a text

## 2019-11-19 NOTE — Telephone Encounter (Signed)
Rxs for inhalers and albuterol neb solution sent. I will send prednisone to PCP for review.

## 2019-11-22 MED FILL — predniSONE 10 MG TABS: 10 | 8 days supply | Qty: 15 | Fill #0

## 2019-12-07 ENCOUNTER — Ambulatory Visit: Payer: Self-pay

## 2019-12-07 DIAGNOSIS — R079 Chest pain, unspecified: Secondary | ICD-10-CM | POA: Diagnosis not present

## 2019-12-07 DIAGNOSIS — J441 Chronic obstructive pulmonary disease with (acute) exacerbation: Secondary | ICD-10-CM | POA: Diagnosis not present

## 2019-12-07 DIAGNOSIS — R0602 Shortness of breath: Secondary | ICD-10-CM | POA: Diagnosis not present

## 2019-12-07 DIAGNOSIS — J439 Emphysema, unspecified: Secondary | ICD-10-CM | POA: Diagnosis not present

## 2019-12-07 DIAGNOSIS — R059 Cough, unspecified: Secondary | ICD-10-CM | POA: Diagnosis not present

## 2019-12-07 NOTE — Telephone Encounter (Signed)
Will forward to pcp

## 2019-12-07 NOTE — Telephone Encounter (Signed)
Patient called stating that he wants to have Dr Laural Benes send him some prednisone and antibiotic. He states that he has COPD and has pneumonia several times.  He is wheezing and states that he can't get the phlegm up when he coughs. He has been weak. He has had diarrhea 3 days ago.  Today he states he has pain across his back. He is a smoker. He is fully vaccinated for COVID-19. Per protocol patient will go to ER for evaluation of his symptoms. Care advice read to patient. He verbalized understanding of all information. He is with his daughter and she will drive him to the hospital  Reason for Disposition . Wheezing can be heard across the room  Answer Assessment - Initial Assessment Questions 1. RESPIRATORY STATUS: "Describe your breathing?" (e.g., wheezing, shortness of breath, unable to speak, severe coughing)      Wheezing, SOB, cough 2. ONSET: "When did this breathing problem begin?"     3 days ago 3. PATTERN "Does the difficult breathing come and go, or has it been constant since it started?"      constant 4. SEVERITY: "How bad is your breathing?" (e.g., mild, moderate, severe)    - MILD: No SOB at rest, mild SOB with walking, speaks normally in sentences, can lay down, no retractions, pulse < 100.    - MODERATE: SOB at rest, SOB with minimal exertion and prefers to sit, cannot lie down flat, speaks in phrases, mild retractions, audible wheezing, pulse 100-120.    - SEVERE: Very SOB at rest, speaks in single words, struggling to breathe, sitting hunched forward, retractions, pulse > 120      moderate 5. RECURRENT SYMPTOM: "Have you had difficulty breathing before?" If Yes, ask: "When was the last time?" and "What happened that time?"      Yes when had pneumonia 6. CARDIAC HISTORY: "Do you have any history of heart disease?" (e.g., heart attack, angina, bypass surgery, angioplasty)     no 7. LUNG HISTORY: "Do you have any history of lung disease?"  (e.g., pulmonary embolus, asthma,  emphysema)    COPD 8. CAUSE: "What do you think is causing the breathing problem?"     Pneumonia 9. OTHER SYMPTOMS: "Do you have any other symptoms? (e.g., dizziness, runny nose, cough, chest pain, fever)     Cough,back pain, runny nose 10. PREGNANCY: "Is there any chance you are pregnant?" "When was your last menstrual period?"       N/A 11. TRAVEL: "Have you traveled out of the country in the last month?" (e.g., travel history, exposures)       N/A  Protocols used: BREATHING DIFFICULTY-A-AH

## 2019-12-07 NOTE — Telephone Encounter (Signed)
Pt called requesting prednisone and antibiotics he states that he has pneumonia. Message for   [facility] #[id number] - [place], [place] - [address] AT [facility] OF [address]  [address] [place] [address]  Phone: [phone] Fax: [phone]

## 2019-12-08 NOTE — Telephone Encounter (Signed)
Pt states he did go to the ED. Pt states he went to the [facility]. Pt is scheduled for a hfu on 11/23

## 2019-12-15 ENCOUNTER — Telehealth: Payer: Self-pay | Admitting: Internal Medicine

## 2019-12-15 NOTE — Telephone Encounter (Signed)
Contacted pt to go over Dr. Johnson message pt didn't answer lvm  

## 2019-12-28 ENCOUNTER — Ambulatory Visit: Payer: Medicaid Other | Attending: Internal Medicine | Admitting: Internal Medicine

## 2019-12-28 ENCOUNTER — Encounter: Payer: Self-pay | Admitting: Internal Medicine

## 2019-12-28 ENCOUNTER — Other Ambulatory Visit: Payer: Self-pay

## 2019-12-28 VITALS — BP 108/66 | HR 72 | Resp 16 | Wt 127.4 lb

## 2019-12-28 DIAGNOSIS — J432 Centrilobular emphysema: Secondary | ICD-10-CM

## 2019-12-28 DIAGNOSIS — B182 Chronic viral hepatitis C: Secondary | ICD-10-CM | POA: Diagnosis not present

## 2019-12-28 DIAGNOSIS — R739 Hyperglycemia, unspecified: Secondary | ICD-10-CM

## 2019-12-28 LAB — POCT GLYCOSYLATED HEMOGLOBIN (HGB A1C): HbA1c POC (<> result, manual entry): 5.5 % (ref 4.0–5.6)

## 2019-12-28 MED ORDER — INCRUSE ELLIPTA 62.5 MCG/INH IN AEPB
1.0000 | INHALATION_SPRAY | Freq: Every day | RESPIRATORY_TRACT | 6 refills | Status: DC
Start: 2019-12-28 — End: 2020-01-06

## 2019-12-28 MED ORDER — ALBUTEROL SULFATE HFA 108 (90 BASE) MCG/ACT IN AERS: 1.0000 | INHALATION_SPRAY | Freq: Four times a day (QID) | RESPIRATORY_TRACT | 2 refills | Status: DC | PRN

## 2019-12-28 MED ORDER — PREDNISONE 20 MG PO TABS: 20.0000 mg | ORAL_TABLET | Freq: Every day | ORAL | 0 refills | Status: AC

## 2019-12-28 NOTE — Patient Instructions (Signed)
Take inhalers as prescribed  Prednisone for 5 days.

## 2019-12-28 NOTE — Assessment & Plan Note (Signed)
This is a tremendous challenge.  He will continue current inhalers.  I reviewed the purpose of each inhaler.  I also reviewed the rescue inhaler and his albuterol nebulizer.  He has a pretty good understanding of which inhalers to use when.  He is having a current exacerbation we will give him prednisone 20 mg p.o. daily for 5 days.  Inhalers refilled.

## 2019-12-28 NOTE — Progress Notes (Signed)
Long term hx of COPD in a long term smoker. He has chronic SOB and states that he has difficulty even walking 30 feet.  He lives in a trailer with daughter and 3 children (including newborn).  He states he is constantly wheezing. He occasionally will wake from sleep with SOB. He uses an albuterol nebulizer frequently.   He has a chronic cough, rarely productive of sputum.   He struggles financially and states that he cannot afford meds.  He has a chronic back pain syndrome and regularly takes 4.5 grams of acetaminophen daily (sometimes more).   Smoking- 1pack every other day or less.   Past Medical History:  Diagnosis Date  . Ankle fracture, left    "casted; no OR"  . Chronic lower back pain   . COPD (chronic obstructive pulmonary disease) (HCC)   . Tobacco abuse     Social History   Socioeconomic History  . Marital status: Divorced    Spouse name: Not on file  . Number of children: Not on file  . Years of education: Not on file  . Highest education level: Not on file  Occupational History  . Not on file  Tobacco Use  . Smoking status: Current Every Day Smoker    Packs/day: 0.25    Years: 43.00    Pack years: 10.75    Types: Cigarettes  . Smokeless tobacco: Never Used  . Tobacco comment: currently on patch  Substance and Sexual Activity  . 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 Drug use: Yes    Comment: 09/21/2013 "used marijuana once when I was 20; used cocaine  in ~ 2000; stopped in ~ 2003"  . Sexual activity: Not Currently  Other Topics Concern  . Not on file  Social History Narrative   Lives in trailer.  Not married.  5 children.     Social Determinants of Health   Financial Resource Strain:   . Difficulty of Paying Living Expenses: Not on file  Food Insecurity:   . Worried About Programme researcher, broadcasting/film/video in the Last Year: Not on file  . Ran Out of Food in the Last Year: Not on file  Transportation  Needs:   . Lack of Transportation (Medical): Not on file  . Lack of Transportation (Non-Medical): Not on file  Physical Activity:   . Days of Exercise per Week: Not on file  . Minutes of Exercise per Session: Not on file  Stress:   . Feeling of Stress : Not on file  Social Connections:   . Frequency of Communication with Friends and Family: Not on file  . Frequency of Social Gatherings with Friends and Family: Not on file  . Attends Religious Services: Not on file  . Active Member of Clubs or Organizations: Not on file  . Attends Banker Meetings: Not on file  . Marital Status: Not on file  Intimate Partner Violence:   . Fear of Current or Ex-Partner: Not on file  . Emotionally Abused: Not on file  . Physically Abused: Not on file  . Sexually Abused: Not on file    Past Surgical History:  Procedure Laterality Date  . INCISION AND DRAINAGE OF WOUND Left ~ 1977   "splinter got infected between" digits 2 & 3    Family History  Problem Relation Age of Onset  . Esophageal cancer Father   . Colon cancer Sister   . Cancer -  Other Brother   . Heart disease Mother   . Diabetes Mellitus II Mother   . Heart disease Other        multiple maternal family members  . Diabetes Mellitus II Other        multiple maternal family members    No Known Allergies  Current Outpatient Medications on File Prior to Visit  Medication Sig Dispense Refill  . acetaminophen (TYLENOL) 325 MG tablet Take 2 tablets (650 mg total) by mouth every 6 (six) hours as needed for mild pain (or Fever >/= 101).    Marland Kitchen albuterol (PROVENTIL) (2.5 MG/3ML) 0.083% nebulizer solution Take 3 mLs (2.5 mg total) by nebulization every 6 (six) hours as needed for wheezing or shortness of breath. 90 mL 2  . albuterol (VENTOLIN HFA) 108 (90 Base) MCG/ACT inhaler Inhale 1-2 puffs into the lungs every 6 (six) hours as needed for wheezing or shortness of breath. 18 g 2  . aspirin EC 81 MG tablet Take 1 tablet (81 mg  total) by mouth daily. 100 tablet 1  . azithromycin (ZITHROMAX Z-PAK) 250 MG tablet 2 tabs p.o. daily x1 day then 1 tablet daily. (Patient not taking: Reported on 12/28/2019) 6 each 0  . budesonide-formoterol (SYMBICORT) 160-4.5 MCG/ACT inhaler Inhale 2 puffs into the lungs 2 (two) times daily. 10.2 g 2  . diphenhydramine-acetaminophen (TYLENOL PM) 25-500 MG TABS tablet Take 1 tablet by mouth at bedtime as needed.    . Glecaprevir-Pibrentasvir (MAVYRET) 100-40 MG TABS Take 3 tablets by mouth daily with breakfast. 84 tablet 1  . Multiple Vitamins-Minerals (MULTIVITAMIN ADULTS 50+) TABS Take 1 tablet by mouth daily.    . nicotine polacrilex (COMMIT) 2 MG lozenge Take 1 lozenge (2 mg total) by mouth as needed for smoking cessation. 50 tablet 1  . predniSONE (DELTASONE) 10 MG tablet 4 tabs daily x 1 day then 3 tabs daily x 2 days then 2 tabs x 2 days then 1 tab x 1 day (Patient not taking: Reported on 12/28/2019) 15 tablet 0  . Respiratory Therapy Supplies (FLUTTER) DEVI Use 4 times daily 1 each 0  . traMADol (ULTRAM) 50 MG tablet Take 1 tablet (50 mg total) by mouth every 8 (eight) hours as needed. 40 tablet 0  . traZODone (DESYREL) 50 MG tablet Take 0.5-1 tablets (25-50 mg total) by mouth at bedtime as needed for sleep. 30 tablet 6  . umeclidinium bromide (INCRUSE ELLIPTA) 62.5 MCG/INH AEPB Inhale 1 puff into the lungs daily. 30 each 6   No current facility-administered medications on file prior to visit.     patient denies chest pain, shortness of breath, orthopnea. Denies lower extremity edema, abdominal pain, change in appetite, change in bowel movements. Patient denies rashes, musculoskeletal complaints. No other specific complaints in a complete review of systems.   BP 108/66   Pulse 72   Resp 16   Wt 127 lb 6.4 oz (57.8 kg)   SpO2 96%   BMI 21.53 kg/m   Pulse ox 99%, after 50 foot walk, pulse ox 94%  well-developed well-nourished male in no acute distress. HEENT exam atraumatic,  normocephalic, neck supple without jugular venous distention. Chest with bilateral wheeze. Also hyperresonant to percussion.  cardiac exam S1-S2 are regular. Abdominal exam thin with bowel sounds, soft and nontender. Extremities no edema. Neurologic exam is alert with a normal gait.   Hep C w/o coma, chronic (HCC) Reviewed labs from [facility]-  trnasaminases are now normal.  I have asked him to decrease acetaminophen  to < 2g/day  Centrilobular emphysema (HCC) This is a tremendous challenge.  He will continue current inhalers.  I reviewed the purpose of each inhaler.  I also reviewed the rescue inhaler and his albuterol nebulizer.  He has a pretty good understanding of which inhalers to use when.  He is having a current exacerbation we will give him prednisone 20 mg p.o. daily for 5 days.  Inhalers refilled.

## 2019-12-28 NOTE — Assessment & Plan Note (Signed)
Reviewed labs from [facility]-  trnasaminases are now normal.  I have asked him to decrease acetaminophen to < 2g/day

## 2020-01-06 ENCOUNTER — Telehealth: Payer: Self-pay | Admitting: Internal Medicine

## 2020-01-06 ENCOUNTER — Telehealth: Payer: Self-pay

## 2020-01-06 ENCOUNTER — Ambulatory Visit: Payer: Medicaid Other | Admitting: Critical Care Medicine

## 2020-01-06 ENCOUNTER — Encounter: Payer: Self-pay | Admitting: Critical Care Medicine

## 2020-01-06 ENCOUNTER — Other Ambulatory Visit: Payer: Self-pay

## 2020-01-06 ENCOUNTER — Ambulatory Visit: Payer: Medicaid Other | Attending: Critical Care Medicine | Admitting: Critical Care Medicine

## 2020-01-06 VITALS — BP 127/76 | HR 71 | Wt 126.6 lb

## 2020-01-06 DIAGNOSIS — F17200 Nicotine dependence, unspecified, uncomplicated: Secondary | ICD-10-CM | POA: Diagnosis not present

## 2020-01-06 DIAGNOSIS — R590 Localized enlarged lymph nodes: Secondary | ICD-10-CM

## 2020-01-06 DIAGNOSIS — R918 Other nonspecific abnormal finding of lung field: Secondary | ICD-10-CM

## 2020-01-06 DIAGNOSIS — J440 Chronic obstructive pulmonary disease with acute lower respiratory infection: Secondary | ICD-10-CM | POA: Diagnosis not present

## 2020-01-06 DIAGNOSIS — Z23 Encounter for immunization: Secondary | ICD-10-CM

## 2020-01-06 DIAGNOSIS — J209 Acute bronchitis, unspecified: Secondary | ICD-10-CM

## 2020-01-06 DIAGNOSIS — J432 Centrilobular emphysema: Secondary | ICD-10-CM

## 2020-01-06 MED ORDER — TRELEGY ELLIPTA 200-62.5-25 MCG/INH IN AEPB: 1.0000 | INHALATION_SPRAY | Freq: Every day | RESPIRATORY_TRACT | 6 refills | Status: DC

## 2020-01-06 MED ORDER — PREDNISONE 10 MG PO TABS: ORAL_TABLET | ORAL | 0 refills | Status: DC

## 2020-01-06 MED ORDER — BUDESONIDE-FORMOTEROL FUMARATE 160-4.5 MCG/ACT IN AERO: 2.0000 | INHALATION_SPRAY | Freq: Two times a day (BID) | RESPIRATORY_TRACT | 6 refills | Status: DC

## 2020-01-06 MED ORDER — CHANTIX STARTING MONTH PAK 0.5 MG X 11 & 1 MG X 42 PO TABS: ORAL_TABLET | ORAL | 0 refills | Status: DC

## 2020-01-06 NOTE — Progress Notes (Signed)
Bilateral hand pain Left foot pain  Still experiencing SOB Insurance will not pay for Incruse Symbicort refill needed

## 2020-01-06 NOTE — Telephone Encounter (Signed)
Dosing # for pred changed

## 2020-01-06 NOTE — Assessment & Plan Note (Signed)
Adenopathy has resolved

## 2020-01-06 NOTE — Assessment & Plan Note (Signed)
As per emphysema assessment 

## 2020-01-06 NOTE — Telephone Encounter (Signed)
I left msg on pts phone, call him back and tell him I sent an Rx for trelegy for him to try one puff daily,  While on trelegy he needs to stop symbicort

## 2020-01-06 NOTE — Telephone Encounter (Signed)
Mandy, from pharmacy, called stating that she is needing to verify the prescription for Prednisone. She states that the full taper quantity would be 84 tablets, but only 80 were sent in. Please advise.

## 2020-01-06 NOTE — Assessment & Plan Note (Signed)
  .   Current smoking consumption amount: Half a pack a day  . Dicsussion on advise to quit smoking and smoking impacts: Behavioral modification medication treatments  . Patient's willingness to quit: Wants to stay quit  . Methods to quit smoking discussed: He has failed nicotine replacement discussed Chantix  . Medication management of smoking session drugs discussed: Chantix  . Resources provided:  AVS   . Setting quit date not established  . Follow-up arranged 6 weeks   Time spent counseling the patient:

## 2020-01-06 NOTE — Patient Instructions (Signed)
Take prednisone 6 a day for 4 days and then reduce by 1 every 4 days till off a total of 80 tablets were given and sent to your [facility] in [place]  Stay on Symbicort 2 puffs twice a day and use a spacer  Use the spacer for your albuterol inhaler as well  We are trying to see if we can get Trelegy on your insurance plan but it appears they are wanting a prior authorization we will check into this  A flu vaccine was given  Stop smoking using Chantix I sent the prescription to your [facility] see below  Return to see Dr. Delford Field in 6 weeks  Varenicline oral tablets What is this medicine? VARENICLINE (var e NI kleen) is used to help people quit smoking. It is used with a patient support program recommended by your physician. This medicine may be used for other purposes; ask your health care provider or pharmacist if you have questions. COMMON BRAND NAME(S): Chantix What should I tell my health care provider before I take this medicine? They need to know if you have any of these conditions:  heart disease  if you often drink alcohol  kidney disease  mental illness  on hemodialysis  seizures  history of stroke  suicidal thoughts, plans, or attempt; a previous suicide attempt by you or a family member  an unusual or allergic reaction to varenicline, other medicines, foods, dyes, or preservatives  pregnant or trying to get pregnant  breast-feeding How should I use this medicine? Take this medicine by mouth after eating. Take with a full glass of water. Follow the directions on the prescription label. Take your doses at regular intervals. Do not take your medicine more often than directed. There are 3 ways you can use this medicine to help you quit smoking; talk to your health care professional to decide which plan is right for you: 1) you can choose a quit date and start this medicine 1 week before the quit date, or, 2) you can start taking this medicine before you choose  a quit date, and then pick a quit date between day 8 and 35 days of treatment, or, 3) if you are not sure that you are able or willing to quit smoking right away, start taking this medicine and slowly decrease the amount you smoke as directed by your health care professional with the goal of being cigarette-free by week 12 of treatment. Stick to your plan; ask about support groups or other ways to help you remain cigarette-free. If you are motivated to quit smoking and did not succeed during a previous attempt with this medicine for reasons other than side effects, or if you returned to smoking after this treatment, speak with your health care professional about whether another course of this medicine may be right for you. A special MedGuide will be given to you by the pharmacist with each prescription and refill. Be sure to read this information carefully each time. Talk to your pediatrician regarding the use of this medicine in children. This medicine is not approved for use in children. Overdosage: If you think you have taken too much of this medicine contact a poison control center or emergency room at once. NOTE: This medicine is only for you. Do not share this medicine with others. What if I miss a dose? If you miss a dose, take it as soon as you can. If it is almost time for your next dose, take only that dose. Do not  take double or extra doses. What may interact with this medicine?  alcohol  insulin  other medicines used to help people quit smoking  theophylline  warfarin This list may not describe all possible interactions. Give your health care provider a list of all the medicines, herbs, non-prescription drugs, or dietary supplements you use. Also tell them if you smoke, drink alcohol, or use illegal drugs. Some items may interact with your medicine. What should I watch for while using this medicine? It is okay if you do not succeed at your attempt to quit and have a cigarette. You  can still continue your quit attempt and keep using this medicine as directed. Just throw away your cigarettes and get back to your quit plan. Talk to your health care provider before using other treatments to quit smoking. Using this medicine with other treatments to quit smoking may increase the risk for side effects compared to using a treatment alone. You may get drowsy or dizzy. Do not drive, use machinery, or do anything that needs mental alertness until you know how this medicine affects you. Do not stand or sit up quickly, especially if you are an older patient. This reduces the risk of dizzy or fainting spells. Decrease the number of alcoholic beverages that you drink during treatment with this medicine until you know if this medicine affects your ability to tolerate alcohol. Some people have experienced increased drunkenness (intoxication), unusual or sometimes aggressive behavior, or no memory of things that have happened (amnesia) during treatment with this medicine. Sleepwalking can happen during treatment with this medicine, and can sometimes lead to behavior that is harmful to you, other people, or property. Stop taking this medicine and tell your doctor if you start sleepwalking or have other unusual sleep-related activity. After taking this medicine, you may get up out of bed and do an activity that you do not know you are doing. The next morning, you may have no memory of this. Activities include driving a car ("sleep-driving"), making and eating food, talking on the phone, sexual activity, and sleep-walking. Serious injuries have occurred. Stop the medicine and call your doctor right away if you find out you have done any of these activities. Do not take this medicine if you have used alcohol that evening. Do not take it if you have taken another medicine for sleep. The risk of doing these sleep-related activities is higher. Patients and their families should watch out for new or worsening  depression or thoughts of suicide. Also watch out for sudden changes in feelings such as feeling anxious, agitated, panicky, irritable, hostile, aggressive, impulsive, severely restless, overly excited and hyperactive, or not being able to sleep. If this happens, call your health care professional. If you have diabetes and you quit smoking, the effects of insulin may be increased and you may need to reduce your insulin dose. Check with your doctor or health care professional about how you should adjust your insulin dose. What side effects may I notice from receiving this medicine? Side effects that you should report to your doctor or health care professional as soon as possible:  allergic reactions like skin rash, itching or hives, swelling of the face, lips, tongue, or throat  acting aggressive, being angry or violent, or acting on dangerous impulses  breathing problems  changes in emotions or moods  chest pain or chest tightness  feeling faint or lightheaded, falls  hallucination, loss of contact with reality  mouth sores  redness, blistering, peeling or loosening  of the skin, including inside the mouth  signs and symptoms of a stroke like changes in vision; confusion; trouble speaking or understanding; severe headaches; sudden numbness or weakness of the face, arm or leg; trouble walking; dizziness; loss of balance or coordination  seizures  sleepwalking  suicidal thoughts or other mood changes Side effects that usually do not require medical attention (report to your doctor or health care professional if they continue or are bothersome):  constipation  gas  headache  nausea, vomiting  strange dreams  trouble sleeping This list may not describe all possible side effects. Call your doctor for medical advice about side effects. You may report side effects to FDA at 1-800-FDA-1088. Where should I keep my medicine? Keep out of the reach of children. Store at room  temperature between 15 and 30 degrees C (59 and 86 degrees F). Throw away any unused medicine after the expiration date. NOTE: This sheet is a summary. It may not cover all possible information. If you have questions about this medicine, talk to your doctor, pharmacist, or health care provider.  2020 Elsevier/Gold Standard (2018-01-09 14:27:36)  Tobacco Use Disorder Tobacco use disorder (TUD) occurs when a person craves, seeks, and uses tobacco, regardless of the consequences. This disorder can cause problems with mental and physical health. It can affect your ability to have healthy relationships, and it can keep you from meeting your responsibilities at work, home, or school. Tobacco may be:  Smoked as a cigarette or cigar.  Inhaled using e-cigarettes.  Smoked in a pipe or hookah.  Chewed as smokeless tobacco.  Inhaled into the nostrils as snuff. Tobacco products contain a dangerous chemical called nicotine, which is very addictive. Nicotine triggers hormones that make the body feel stimulated and works on areas of the brain that make you feel good. These effects can make it hard for people to quit nicotine. Tobacco contains many other unsafe chemicals that can damage almost every organ in the body. Smoking tobacco also puts others in danger due to fire risk and possible health problems caused by breathing in secondhand smoke. What are the signs or symptoms? Symptoms of TUD may include:  Being unable to slow down or stop your tobacco use.  Spending an abnormal amount of time getting or using tobacco.  Craving tobacco. Cravings may last for up to 6 months after quitting.  Tobacco use that: ? Interferes with your work, school, or home life. ? Interferes with your personal and social relationships. ? Makes you give up activities that you once enjoyed or found important.  Using tobacco even though you know that it is: ? Dangerous or bad for your health or someone else's  health. ? Causing problems in your life.  Needing more and more of the substance to get the same effect (developing tolerance).  Experiencing unpleasant symptoms if you do not use the substance (withdrawal). Withdrawal symptoms may include: ? Depressed, anxious, or irritable mood. ? Difficulty concentrating. ? Increased appetite. ? Restlessness or trouble sleeping.  Using the substance to avoid withdrawal. How is this diagnosed? This condition may be diagnosed based on:  Your current and past tobacco use. Your health care provider may ask questions about how your tobacco use affects your life.  A physical exam. You may be diagnosed with TUD if you have at least two symptoms within a 60-month period. How is this treated? This condition is treated by stopping tobacco use. Many people are unable to quit on their own and need help.  Treatment may include:  Nicotine replacement therapy (NRT). NRT provides nicotine without the other harmful chemicals in tobacco. NRT gradually lowers the dosage of nicotine in the body and reduces withdrawal symptoms. NRT is available as: ? Over-the-counter gums, lozenges, and skin patches. ? Prescription mouth inhalers and nasal sprays.  Medicine that acts on the brain to reduce cravings and withdrawal symptoms.  A type of talk therapy that examines your triggers for tobacco use, how to avoid them, and how to cope with cravings (behavioral therapy).  Hypnosis. This may help with withdrawal symptoms.  Joining a support group for others coping with TUD. The best treatment for TUD is usually a combination of medicine, talk therapy, and support groups. Recovery can be a long process. Many people start using tobacco again after stopping (relapse). If you relapse, it does not mean that treatment will not work. Follow these instructions at home:  Lifestyle  Do not use any products that contain nicotine or tobacco, such as cigarettes and e-cigarettes.  Avoid  things that trigger tobacco use as much as you can. Triggers include people and situations that usually cause you to use tobacco.  Avoid drinks that contain caffeine, including coffee. These may worsen some withdrawal symptoms.  Find ways to manage stress. Wanting to smoke may cause stress, and stress can make you want to smoke. Relaxation techniques such as deep breathing, meditation, and yoga may help.  Attend support groups as needed. These groups are an important part of long-term recovery for many people. General instructions  Take over-the-counter and prescription medicines only as told by your health care provider.  Check with your health care provider before taking any new prescription or over-the-counter medicines.  Decide on a friend, family member, or smoking quit-line (such as 1-800-QUIT-NOW in the [place] that you can call or text when you feel the urge to smoke or when you need help coping with cravings.  Keep all follow-up visits as told by your health care provider and therapist. This is important. Contact a health care provider if:  You are not able to take your medicines as prescribed.  Your symptoms get worse, even with treatment. Summary  Tobacco use disorder (TUD) occurs when a person craves, seeks, and uses tobacco regardless of the consequences.  This condition may be diagnosed based on your current and past tobacco use and a physical exam.  Many people are unable to quit on their own and need help. Recovery can be a long process.  The most effective treatment for TUD is usually a combination of medicine, talk therapy, and support groups. This information is not intended to replace advice given to you by your health care provider. Make sure you discuss any questions you have with your health care provider. Document Revised: 01/08/2017 Document Reviewed: 01/08/2017 Elsevier Patient Education  2020 [employer]

## 2020-01-06 NOTE — Assessment & Plan Note (Signed)
Centrilobular emphysema with reexacerbation of COPD We will prescribe another prolonged course of prednisone ' I am going to attempt to use Chantix to see if we can get this patient off tobacco products A spacer was given for the patient's inhalers  We will see if the patient's insurance will cover Trelegy but for now use Symbicort

## 2020-01-06 NOTE — Telephone Encounter (Signed)
Pt seen Dr. Delford Field this morning will forward to him

## 2020-01-06 NOTE — Progress Notes (Signed)
Subjective:    Patient ID: Corey HazardDavid W Schomer, male    DOB: 02/23/1955, 64 y.o.   MRN: [id number]  This is a 64 year old male who has advanced chronic obstructive lung disease.  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  The patient had [facility] but because after obtaining Medicaid for and formulary changes he was switched back to Symbicort and Spiriva.   Note unfortunately he is only been using the Symbicort once a day, and has run out of the Incruse and needs a refill.  Also a flutter valve was ordered the last visit but he never picked this up.   The patient continues to smoke a pack a week of cigarettes.  He has been trying to cut back and is using nicotine replacement therapy for this.  The patient states his dyspnea is getting progressively worse and he has had coughing up thick yellow mucus.  The mucus is difficult to raise.    The patient also has had chronic low back pain that was exacerbated by a fall previously this summer from his porch.  He has an MRI of the spine that is pending and has not yet made the appointment for that that was scheduled.  He also has nodules in his chest which need follow-up from her previous CT August 2019.  Once again he failed to follow-up on the order for this scan.  The patient was asking for pain management from me but I indicated to him I am seeing him for his lungs that he should follow-up with his primary care provider regarding the pain management.  He states the gabapentin caused side effects and tramadol does not affect his pain.  He is firmly adamant he does not want higher dose opiates   He is in the hepatitis C clinic and had a recent ultrasound of the liver showing moderate risk for fibrosis but currently only hepatic steatosis and he is now starting antivirals for hepatitis C  12/29/2018 This is a follow-up visit for a patient with history of COPD with gold stage C prior  smoking history history of lung nodules and right axillary lymphadenopathy.  Also the patient has chronic lumbar radiculopathy.  He had pending scans for the lung nodules and the neck and spine however he had not kept those appointments.  He [place] to care for his mother who is ill.  The patient's breathing is at baseline.  He states he is improved and states the flutter valve has been helpful in secretion removal.  The patient does maintain the Symbicort and Incruse The patient is still smoking about 10 cigarettes daily  01/06/2020 This patient is seen in return follow-up for severe COPD and is being seen on referral from Dr. Laural BenesJohnson.  Note the patient was able obtain a CT scan of the chest recently which showed resolution of all lung nodules in the chest and all he has now is just severe emphysema.  Note his adenopathy in the left and right axilla have resolved  The patient has had recurrent symptoms of COPD exacerbations and in fact just finished a course of prednisone with a rapid taper.  However as the prednisone tapered off his symptoms recurred.  He is still smoking a half a pack a day of cigarettes at this time.  He is yet to receive his flu vaccine this season however he  had received his Covid vaccines ending in September He was unable to receive the Incruse because it was not covered under his insurance plan he is on the Symbicort twice daily he does not have a spacer device.  Shortness of Breath This is a chronic problem. The current episode started more than 1 year ago. The problem occurs daily (any activity is dyspneic, walking or picking up an object ). The problem has been gradually worsening. Associated symptoms include neck pain and sputum production. Pertinent negatives include no chest pain, fever, headaches, hemoptysis, leg swelling, orthopnea, PND or wheezing. The symptoms are aggravated by lying flat, weather changes, emotional upset,  exercise and any activity. Associated symptoms comments: Left arm numbness, mucus is green yellow. Risk factors include smoking. He has tried steroid inhalers and beta agonist inhalers for the symptoms. His past medical history is significant for COPD and pneumonia. There is no history of CAD, a heart failure or PE.   Past Medical History:  Diagnosis Date  . Ankle fracture, left    "casted; no OR"  . Chronic lower back pain   . COPD (chronic obstructive pulmonary disease) (HCC)   . Lung nodules 09/12/2017  . Tobacco abuse      Family History  Problem Relation Age of Onset  . Esophageal cancer Father   . Colon cancer Sister   . Cancer - Other Brother   . Heart disease Mother   . Diabetes Mellitus II Mother   . Heart disease Other        multiple maternal family members  . Diabetes Mellitus II Other        multiple maternal family members     Social History   Socioeconomic History  . Marital status: Divorced    Spouse name: Not on file  . Number of children: Not on file  . Years of education: Not on file  . Highest education level: Not on file  Occupational History  . Not on file  Tobacco Use  . Smoking status: Current Every Day Smoker    Packs/day: 0.25    Years: 43.00    Pack years: 10.75    Types: Cigarettes  . Smokeless tobacco: Never Used  . Tobacco comment: currently on patch  Substance and Sexual Activity  . 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 Drug use: Yes    Comment: 09/21/2013 "used marijuana once when I was 20; used cocaine  in ~ 2000; stopped in ~ 2003"  . Sexual activity: Not Currently  Other Topics Concern  . Not on file  Social History Narrative   Lives in trailer.  Not married.  5 children.     Social Determinants of Health   Financial Resource Strain:   . Difficulty of Paying Living Expenses: Not on file  Food Insecurity:   . Worried About Programme researcher, broadcasting/film/video in the Last Year: Not  on file  . Ran Out of Food in the Last Year: Not on file  Transportation Needs:   . Lack of Transportation (Medical): Not on file  . Lack of Transportation (Non-Medical): Not on file  Physical Activity:   . Days of Exercise per Week: Not on file  . Minutes of Exercise per Session: Not on file  Stress:   . Feeling of Stress : Not on file  Social Connections:   . Frequency of Communication with Friends and Family: Not on file  . Frequency  of Social Gatherings with Friends and Family: Not on file  . Attends Religious Services: Not on file  . Active Member of Clubs or Organizations: Not on file  . Attends Banker Meetings: Not on file  . Marital Status: Not on file  Intimate Partner Violence:   . Fear of Current or Ex-Partner: Not on file  . Emotionally Abused: Not on file  . Physically Abused: Not on file  . Sexually Abused: Not on file     No Known Allergies   Outpatient Medications Prior to Visit  Medication Sig Dispense Refill  . acetaminophen (TYLENOL) 325 MG tablet Take 2 tablets (650 mg total) by mouth every 6 (six) hours as needed for mild pain (or Fever >/= 101).    Marland Kitchen albuterol (VENTOLIN HFA) 108 (90 Base) MCG/ACT inhaler Inhale 1-2 puffs into the lungs every 6 (six) hours as needed for wheezing or shortness of breath. 18 g 2  . aspirin EC 81 MG tablet Take 1 tablet (81 mg total) by mouth daily. 100 tablet 1  . budesonide-formoterol (SYMBICORT) 160-4.5 MCG/ACT inhaler Inhale 2 puffs into the lungs 2 (two) times daily. 10.2 g 2  . diphenhydramine-acetaminophen (TYLENOL PM) 25-500 MG TABS tablet Take 1 tablet by mouth at bedtime as needed.    . Multiple Vitamins-Minerals (MULTIVITAMIN ADULTS 50+) TABS Take 1 tablet by mouth daily.    Marland Kitchen Respiratory Therapy Supplies (FLUTTER) DEVI Use 4 times daily 1 each 0  . albuterol (PROVENTIL) (2.5 MG/3ML) 0.083% nebulizer solution Take 2.5 mg by nebulization every 6 (six) hours as needed.    . umeclidinium bromide (INCRUSE  ELLIPTA) 62.5 MCG/INH AEPB Inhale 1 puff into the lungs daily. (Patient not taking: Reported on 01/06/2020) 30 each 6   No facility-administered medications prior to visit.      Review of Systems  Constitutional: Positive for activity change and fatigue. Negative for appetite change and fever.  HENT: Negative.   Eyes: Positive for visual disturbance.  Respiratory: Positive for cough, sputum production, chest tightness and shortness of breath. Negative for hemoptysis and wheezing.   Cardiovascular: Negative for chest pain, orthopnea, leg swelling and PND.  Gastrointestinal: Negative.   Genitourinary: Negative.   Musculoskeletal: Positive for back pain, gait problem, myalgias, neck pain and neck stiffness.  Skin: Negative.   Neurological: Positive for dizziness, weakness and numbness. Negative for tremors, syncope, facial asymmetry, speech difficulty, light-headedness and headaches.  Hematological: Positive for adenopathy.  Psychiatric/Behavioral: Negative.        Objective:   Physical Exam Vitals:   01/06/20 0859  BP: 127/76  Pulse: 71  SpO2: 97%  Weight: 126 lb 9.6 oz (57.4 kg)    Gen: Pleasant, thin  in no distress,  normal affect  ENT: No lesions,  mouth clear,  oropharynx clear, no postnasal drip  Neck: No JVD, no TMG, no carotid bruits  Lungs: No use of accessory muscles, no dullness to percussion, distant breath sounds without evidence of active wheezing  Axillary adenopathy has resolved cardiovascular: RRR, heart sounds normal, no murmur or gallops, no peripheral edema  Abdomen: soft and NT, no HSM,  BS normal  Musculoskeletal: No deformities, no cyanosis or clubbing  Neuro: alert, non focal  Skin: Warm, no lesions or rashes  CT of the chest was reviewed from August 2019 and showed scattered pulmonary nodules appear to be benign in nature Narrative   CT Chest from 12/07/19 Performed by [facility] INDICATION: Persistent cough..  COMPARISON: Chest x-ray  12/07/2019 2 hours prior.  CT chest 03/13/2019.  TECHNIQUE: CT CHEST WO IV CONTRAST - Radiation dose reduction was utilized (automated exposure control, mA or kV adjustment based on patient size, or iterative image reconstruction).   FINDINGS:  # Heart: Heart size normal without pericardial effusion.  # Lymphadenopathy: No thoracic lymphadenopathy.  # Lungs: No focal consolidation. No pleural effusions. Emphysema.  # Pneumothorax: None.  # Bones: No acute fractures or dislocations. No osseous destructive lesions.      Assessment & Plan:  I personally reviewed all images and lab data in the [facility] system as well as any outside material available during this office visit and agree with the  radiology impressions.   Centrilobular emphysema (HCC) Centrilobular emphysema with reexacerbation of COPD We will prescribe another prolonged course of prednisone ' I am going to attempt to use Chantix to see if we can get this patient off tobacco products A spacer was given for the patient's inhalers  We will see if the patient's insurance will cover Trelegy but for now use Symbicort  COPD with acute bronchitis (HCC) As per emphysema assessment  Axillary lymphadenopathy Adenopathy has resolved  Lung nodules Recent CT of the chest shows resolution of nodules  Tobacco dependence    . Current smoking consumption amount: Half a pack a day  . Dicsussion on advise to quit smoking and smoking impacts: Behavioral modification medication treatments  . Patient's willingness to quit: Wants to stay quit  . Methods to quit smoking discussed: He has failed nicotine replacement discussed Chantix  . Medication management of smoking session drugs discussed: Chantix  . Resources provided:  AVS   . Setting quit date not established  . Follow-up arranged 6 weeks   Time spent counseling the patient:      Jane was seen today for follow-up.  Diagnoses and all orders for this visit:  Need for  immunization against influenza -     Flu Vaccine QUAD 36+ mos IM  Tobacco dependence  Centrilobular emphysema (HCC)  COPD with acute bronchitis (HCC)  Axillary lymphadenopathy  Lung nodules  Other orders -     Discontinue: predniSONE (DELTASONE) 10 MG tablet; Take 6  for four days 5  for four days 4  for four days 3  for four days 2 for four days 1 for four days and stop -     varenicline (CHANTIX STARTING MONTH PAK) 0.5 MG X 11 & 1 MG X 42 tablet; Take one 0.5 mg tablet by mouth once daily for 3 days, then increase to one 0.5 mg tablet twice daily for 4 days, then increase to one 1 mg tablet twice daily.  Flu vaccine given this visit

## 2020-01-06 NOTE — Assessment & Plan Note (Signed)
Recent CT of the chest shows resolution of nodules

## 2020-01-06 NOTE — Telephone Encounter (Signed)
Trelegy PA has been approved until 01/05/21

## 2020-01-06 NOTE — Addendum Note (Signed)
Addended by: Storm Frisk on: 01/06/2020 03:42 PM   Modules accepted: Orders

## 2020-01-06 NOTE — Telephone Encounter (Signed)
Att to contact pt to advise that Trelegy approved w/$3 copay no ans unable to lvm for pt

## 2020-01-13 IMAGING — CT CT CHEST W/O CM
2 of 3 series · 15 of 36 positions shown, 18 images · non-contrast
Comparison: CTA chest dated 04/04/2017

CLINICAL DATA: Follow-up pulmonary nodules

EXAM:
CT CHEST WITHOUT CONTRAST
TECHNIQUE: Multidetector CT imaging of the chest was performed following the
standard protocol without IV contrast.

[Series 4: thorax 2.0 · axial · 0.70mm/px · z∈[+980,+1240]mm · 12 of 154 slices shown, 15 images]
[im 12/154  mediastinal]
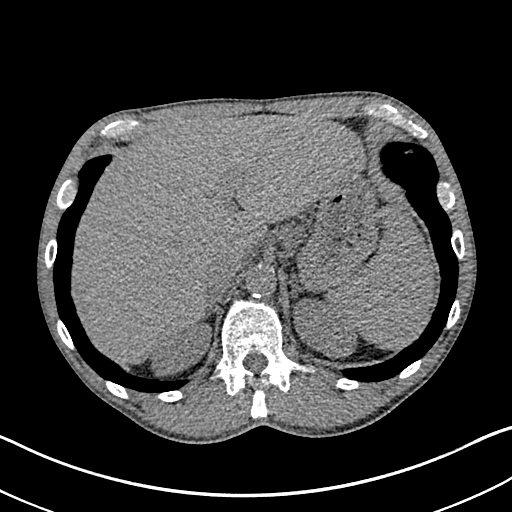
[im 12/154  lung]
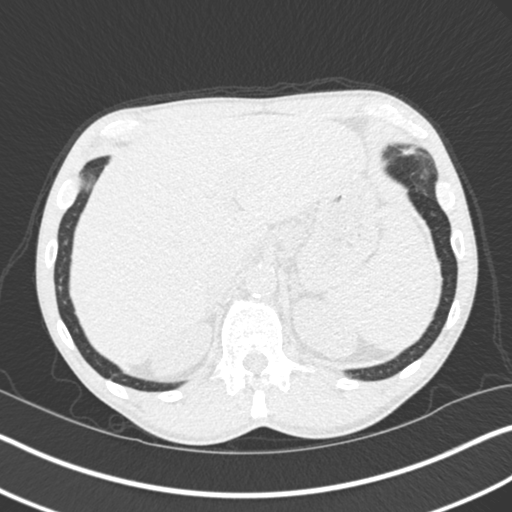
[im 23/154  lung]
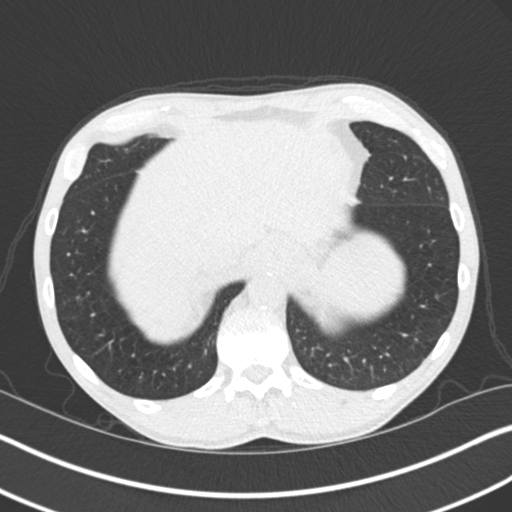
[im 35/154  lung]
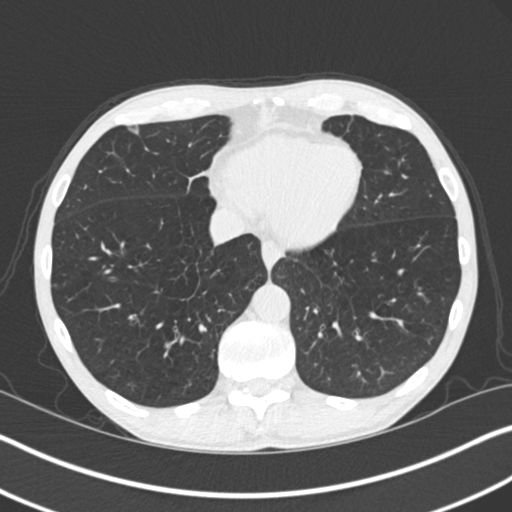
[im 46/154  lung]
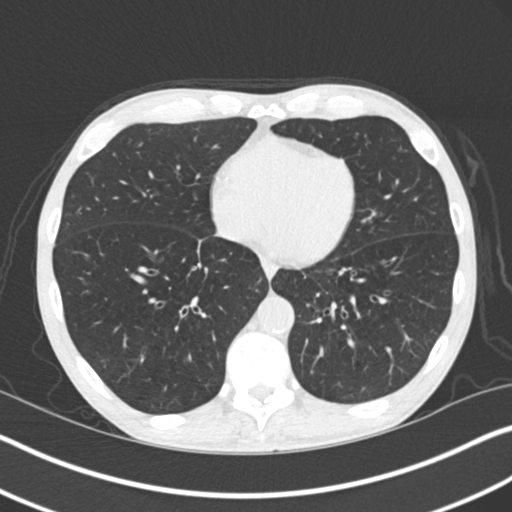
[im 57/154  mediastinal]
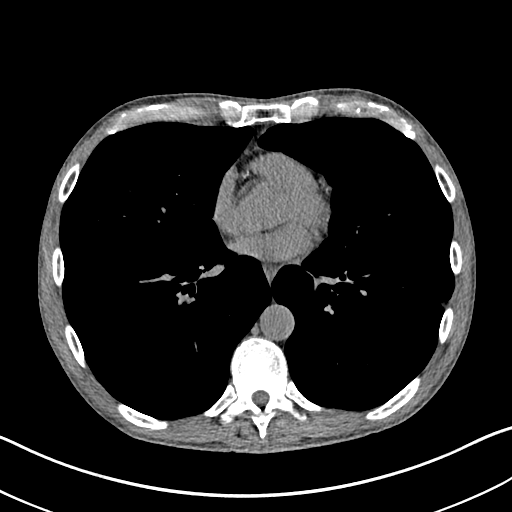
[im 57/154  lung]
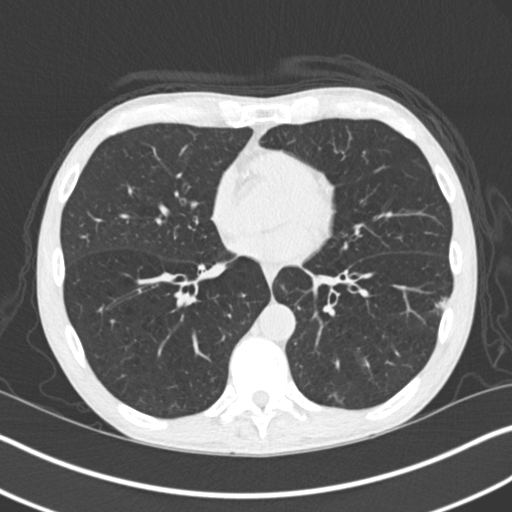
[im 69/154  lung]
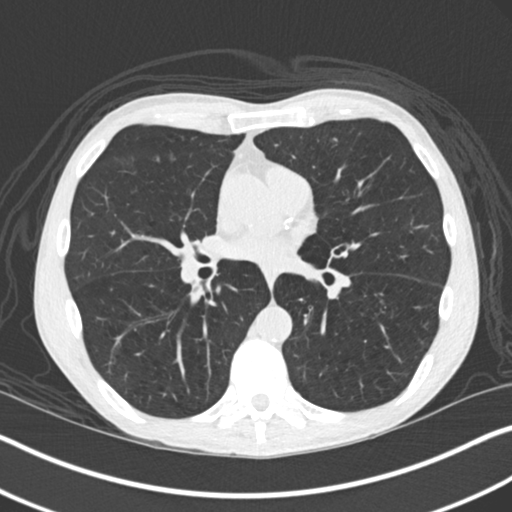
[im 86/154  lung]
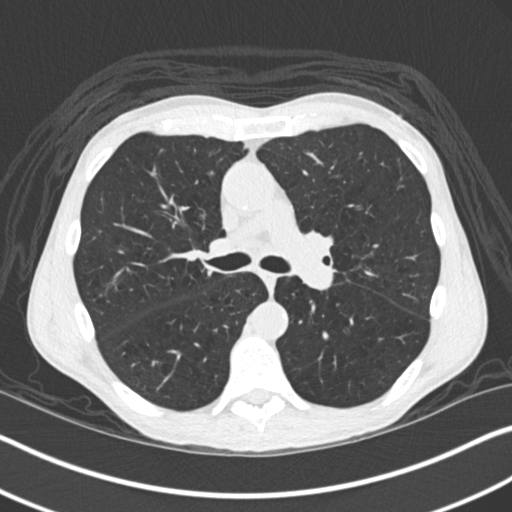
[im 97/154  lung]
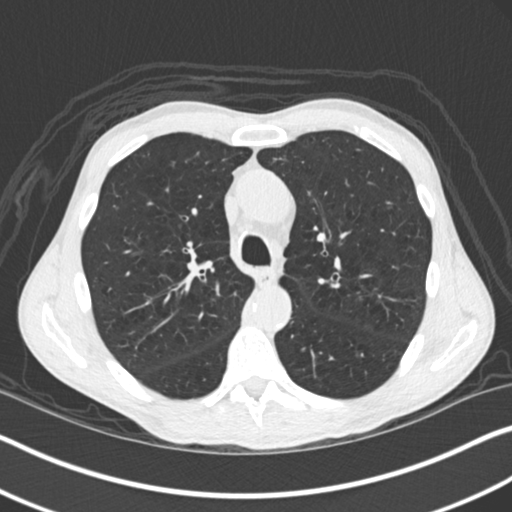
[im 108/154  mediastinal]
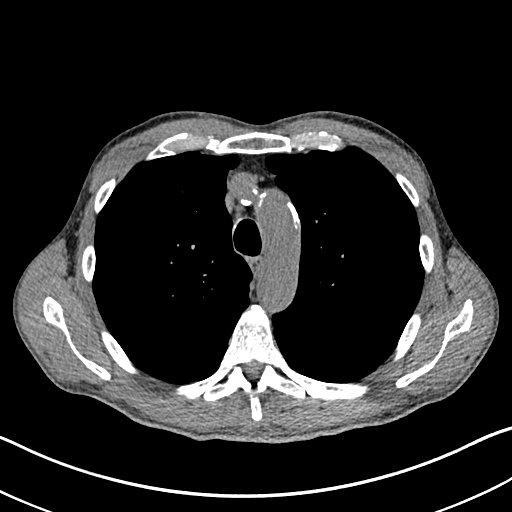
[im 108/154  lung]
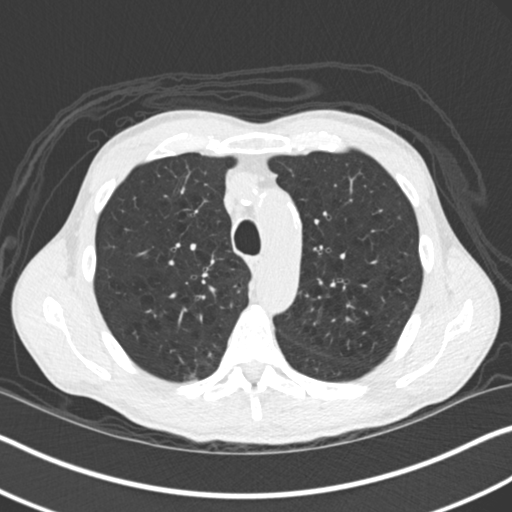
[im 120/154  lung]
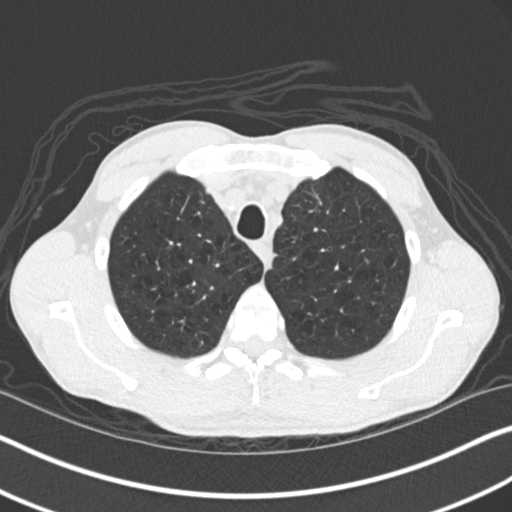
[im 131/154  lung]
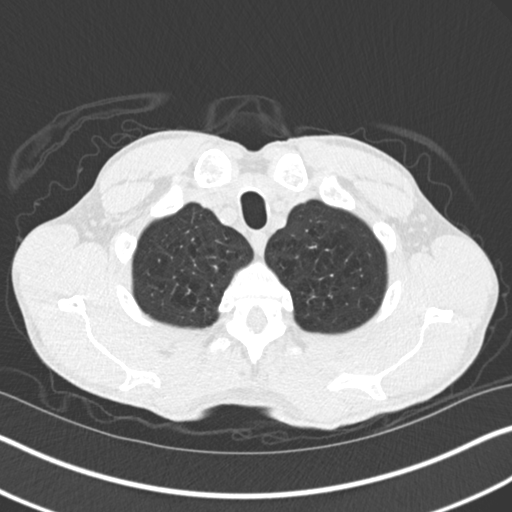
[im 142/154  lung]
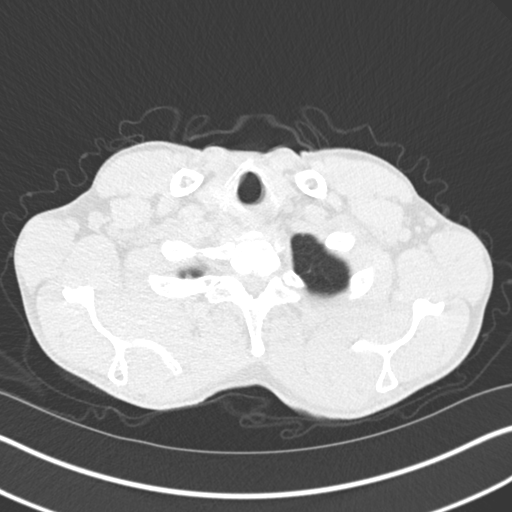

[Series 6: coronal · coronal · 0.66mm/px · 3 of 99 slices shown]
[im 20/99  lung]
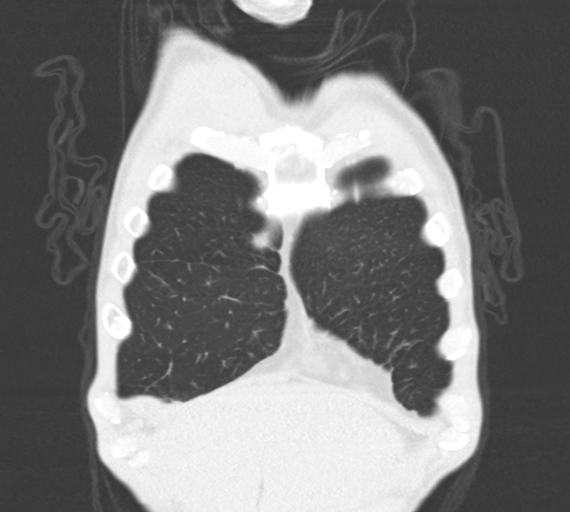
[im 40/99  lung]
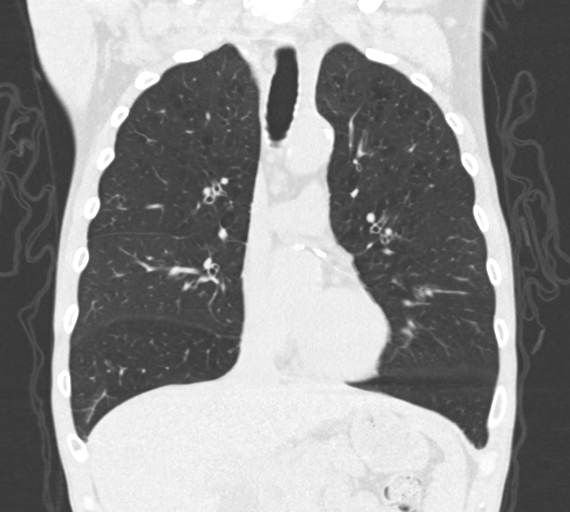
[im 59/99  lung]
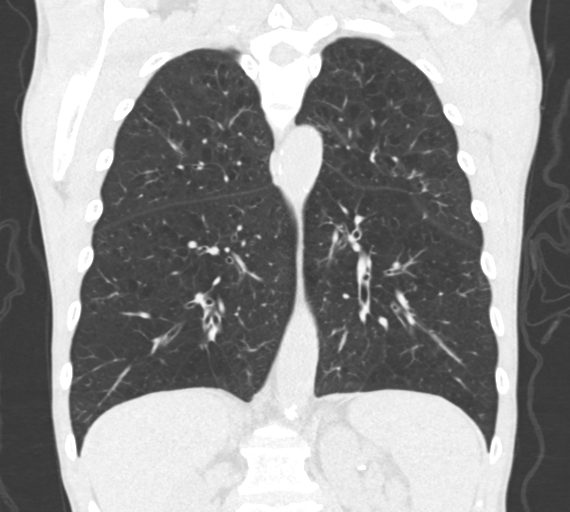

[15 of 36 positions shown; findings below may reference images not displayed]

FINDINGS: Cardiovascular: The heart is normal in size. No pericardial
effusion.

No evidence thoracic aortic aneurysm. Atherosclerotic calcifications
of the aortic arch.

Coronary atherosclerosis the LAD and right coronary artery.

Mediastinum/Nodes: 11 mm short axis low right paratracheal node.

Visualized thyroid is unremarkable.

Lungs/Pleura: Prior bilateral lower lobe pneumonia has resolved.

New irregular/spiculated opacity in the subpleural left lower lobe
measuring 1.7 x 1.0 cm (series 8/image 95), favoring post
infectious/inflammatory scarring.

Stable 7 mm ovoid subpleural right lower lobe nodule (series 8/image
125). Stable 8 mm triangular subpleural nodule in the right middle
lobe (series 8/image 121). Stable 3 mm triangular subpleural nodule
in the posterior left upper lobe (series 8/image 44). These all
likely reflect benign subpleural lymph nodes.

No new/suspicious pulmonary nodules.

Mild centrilobular and paraseptal emphysematous changes, upper lobe
predominant.

No pleural effusion or pneumothorax.

Upper Abdomen: Visualized upper abdomen is grossly unremarkable,
noting vascular calcifications.

Musculoskeletal: Visualized osseous structures are within normal
limits.
IMPRESSION: Stable bilateral pulmonary nodules measuring up to 8 mm, favoring
benign subpleural lymph nodes. Six month stability has been
demonstrated. Follow-up CT chest is recommended in 12-18 months
(18-24 months from initial CT) in this high risk patient. This
recommendation follows the consensus statement: Guidelines for
Management of Small Pulmonary Nodules Detected on CT Images: From
the [HOSPITAL] 5176; Radiology 5176; [DATE].

However, there is new irregular/spiculated opacity in the subpleural
left lower lobe, measuring 1.7 x 1.0 cm. While this appearance
favors postinfectious/inflammatory scarring, a single interval
follow-up CT chest in 3-6 months should be considered to document
stability.

Prior bilateral lower lobe pneumonia has resolved.

Aortic Atherosclerosis (5V5JX-BXA.A) and Emphysema (5V5JX-CJQ.J).

## 2020-01-19 ENCOUNTER — Other Ambulatory Visit: Payer: Self-pay | Admitting: Critical Care Medicine

## 2020-01-19 DIAGNOSIS — J432 Centrilobular emphysema: Secondary | ICD-10-CM

## 2020-01-20 ENCOUNTER — Other Ambulatory Visit: Payer: Self-pay | Admitting: Internal Medicine

## 2020-01-20 MED ORDER — CHANTIX STARTING MONTH PAK 0.5 MG X 11 & 1 MG X 42 PO TABS
ORAL_TABLET | ORAL | 3 refills | Status: DC
Start: 2020-01-20 — End: 2020-07-10

## 2020-01-20 NOTE — Telephone Encounter (Signed)
Medication Refill - Medication: varenicline (CHANTIX STARTING MONTH PAK) 0.5 MG X 11 & 1 MG X 42 tablet    Has the patient contacted their pharmacy? yes (Agent: If no, request that the patient contact the pharmacy for the refill.) (Agent: If yes, when and what did the pharmacy advise?)Contact PCP  Preferred Pharmacy (with phone number or street name):  [id number] - [place], [place] - [address] Phone:  [phone]  Fax:  [phone]       Agent: Please be advised that RX refills may take up to 3 business days. We ask that you follow-up with your pharmacy.

## 2020-02-03 ENCOUNTER — Other Ambulatory Visit: Payer: Self-pay | Admitting: Internal Medicine

## 2020-02-03 NOTE — Telephone Encounter (Signed)
Requested medication (s) are due for refill today:   Yes  Requested medication (s) are on the active medication list:   Yes  Future visit scheduled:   No   Last ordered: Recently seen by Dr. Delford Field. Returned because this was last prescribed by a historical provider.   Looks like from ED at [facility].   Requested Prescriptions  Pending Prescriptions Disp Refills   albuterol (PROVENTIL) (2.5 MG/3ML) 0.083% nebulizer solution [Pharmacy Med Name: ALBUTEROL 0.083%(2.5MG /3ML) 30X3ML] 90 mL     Sig: USE 1 VIAL VIA NEBULIZER EVERY 6 HOURS AS NEEDED FOR WHEEZING OR SHORTNESS OF BREATH      Pulmonology:  Beta Agonists Failed - 02/03/2020 11:52 AM      Failed - One inhaler should last at least one month. If the patient is requesting refills earlier, contact the patient to check for uncontrolled symptoms.      Passed - Valid encounter within last 12 months    Recent Outpatient Visits           4 weeks ago COPD with acute bronchitis (HCC)   [facility] Storm Frisk, MD   1 month ago Hyperglycemia   [facility] Swords, Valetta Mole, MD   5 months ago Lumbar radiculopathy   [facility] Marcine Matar, MD   1 year ago Centrilobular emphysema [facility])   [facility] Storm Frisk, MD   1 year ago COPD with acute bronchitis [facility])   [facility] Storm Frisk, MD

## 2020-02-04 ENCOUNTER — Other Ambulatory Visit: Payer: Self-pay | Admitting: Internal Medicine

## 2020-02-07 ENCOUNTER — Other Ambulatory Visit: Payer: Self-pay | Admitting: Internal Medicine

## 2020-02-19 ENCOUNTER — Other Ambulatory Visit: Payer: Self-pay | Admitting: Critical Care Medicine

## 2020-02-19 DIAGNOSIS — J432 Centrilobular emphysema: Secondary | ICD-10-CM

## 2020-02-23 ENCOUNTER — Telehealth: Payer: Self-pay | Admitting: Pharmacist

## 2020-02-23 ENCOUNTER — Telehealth: Payer: Self-pay | Admitting: Internal Medicine

## 2020-02-23 MED ORDER — BUDESONIDE-FORMOTEROL FUMARATE 160-4.5 MCG/ACT IN AERO: 2.0000 | INHALATION_SPRAY | Freq: Two times a day (BID) | RESPIRATORY_TRACT | 6 refills | Status: DC

## 2020-02-23 NOTE — Telephone Encounter (Signed)
Touched base with patient. He reports that the Trelegy is not working for him. It is a DPI inhaler and he is unable to inhale the powder. He requests to be placed back on Symbicort since it is an HFA. He reports better control with Symbicort. Will route to PCP to approve.

## 2020-02-23 NOTE — Telephone Encounter (Signed)
Contacted pt to go over Dr. Johnson response pt is aware and doesn't have any questions or concerns  

## 2020-02-23 NOTE — Addendum Note (Signed)
Addended by: Jonah Blue B on: 02/23/2020 02:03 PM   Modules accepted: Orders

## 2020-02-23 NOTE — Telephone Encounter (Signed)
Call placed to patient with no answer. Left VM stating the reason for my call with instructions to call back. Will continue to try and reach patient throughout today.

## 2020-02-23 NOTE — Telephone Encounter (Signed)
Patient called to speak with Dr. Delford Field regarding his medication.  He stated that he prefers to take the [facility] 160-4.5 MCG/ACT inhaler because it works best for him.  He would like to know why it was denied.  Please call patient back asap to discuss at [phone]

## 2020-03-08 ENCOUNTER — Other Ambulatory Visit: Payer: Self-pay | Admitting: Critical Care Medicine

## 2020-04-04 ENCOUNTER — Other Ambulatory Visit: Payer: Self-pay | Admitting: Internal Medicine

## 2020-04-04 DIAGNOSIS — J432 Centrilobular emphysema: Secondary | ICD-10-CM

## 2020-04-04 MED ORDER — ALBUTEROL SULFATE HFA 108 (90 BASE) MCG/ACT IN AERS: 1.0000 | INHALATION_SPRAY | Freq: Four times a day (QID) | RESPIRATORY_TRACT | 0 refills | Status: DC | PRN

## 2020-04-04 NOTE — Telephone Encounter (Signed)
Medication Refill - Medication: albuterol (VENTOLIN HFA) 108 (90 Base) MCG/ACT inhaler   predniSONE (DELTASONE) 10 MG tablet  Pt has a 24hr override from [facility] due to other insurance companies claiming pt has coverage with them / Pt needs refills sent today if possible due to this issue [facility] will only cover his meds today with the override/ please advise / Please advise if Rx is sent today  Has the patient contacted their pharmacy? Yes.   (Agent: If no, request that the patient contact the pharmacy for the refill.) (Agent: If yes, when and what did the pharmacy advise?) Rx expired / call pcp Preferred Pharmacy (with phone number or street name):  [facility] - [place], [place] - [address] Phone:  [phone]  Fax:  [phone]       Agent: Please be advised that RX refills may take up to 3 business days. We ask that you follow-up with your pharmacy.

## 2020-04-04 NOTE — Telephone Encounter (Signed)
Requested medication (s) are due for refill today: no  Requested medication (s) are on the active medication list: yes  Last refill:  01/05/2021  Future visit scheduled: yes  Notes to clinic:  this refill cannot be delegated    Requested Prescriptions  Pending Prescriptions Disp Refills   predniSONE (DELTASONE) 10 MG tablet 84 tablet 0    Sig: Take 6  for four days 5  for four days 4  for four days 3  for four days 2 for four days 1 for four days and stop      Not Delegated - Endocrinology:  Oral Corticosteroids Failed - 04/04/2020 11:05 AM      Failed - This refill cannot be delegated      Passed - Last BP in normal range    BP Readings from Last 1 Encounters:  01/06/20 127/76          Passed - Valid encounter within last 6 months    Recent Outpatient Visits           2 months ago COPD with acute bronchitis (HCC)   [facility] Storm Frisk, MD   3 months ago Hyperglycemia   [facility] Swords, Valetta Mole, MD   7 months ago Lumbar radiculopathy   [facility] Marcine Matar, MD   1 year ago Centrilobular emphysema [facility])   [facility] Storm Frisk, MD   1 year ago COPD with acute bronchitis [facility])   [facility] Storm Frisk, MD       Future Appointments             In 1 month Storm Frisk, MD [facility]              Signed Prescriptions Disp Refills   albuterol (VENTOLIN HFA) 108 (90 Base) MCG/ACT inhaler 18 g 0    Sig: Inhale 1-2 puffs into the lungs every 6 (six) hours as needed for wheezing or shortness of breath.      Pulmonology:  Beta Agonists Failed - 04/04/2020 11:05 AM      Failed - One inhaler should last at least one month. If the patient is requesting refills earlier, contact the patient to check for uncontrolled symptoms.      Passed - Valid encounter  within last 12 months    Recent Outpatient Visits           2 months ago COPD with acute bronchitis (HCC)   [facility] Storm Frisk, MD   3 months ago Hyperglycemia   [facility] Swords, Valetta Mole, MD   7 months ago Lumbar radiculopathy   [facility] Marcine Matar, MD   1 year ago Centrilobular emphysema [facility])   [facility] Storm Frisk, MD   1 year ago COPD with acute bronchitis [facility])   [facility] Storm Frisk, MD       Future Appointments             In 1 month Delford Field Charlcie Cradle, MD [facility]

## 2020-04-05 MED ORDER — PREDNISONE 10 MG PO TABS: ORAL_TABLET | ORAL | 0 refills | Status: DC

## 2020-05-10 ENCOUNTER — Ambulatory Visit: Payer: Medicaid Other | Attending: Critical Care Medicine | Admitting: Critical Care Medicine

## 2020-05-10 ENCOUNTER — Encounter: Payer: Self-pay | Admitting: Critical Care Medicine

## 2020-05-10 ENCOUNTER — Other Ambulatory Visit: Payer: Self-pay

## 2020-05-10 DIAGNOSIS — J209 Acute bronchitis, unspecified: Secondary | ICD-10-CM

## 2020-05-10 DIAGNOSIS — F17200 Nicotine dependence, unspecified, uncomplicated: Secondary | ICD-10-CM | POA: Diagnosis not present

## 2020-05-10 DIAGNOSIS — M545 Low back pain, unspecified: Secondary | ICD-10-CM | POA: Diagnosis not present

## 2020-05-10 DIAGNOSIS — J440 Chronic obstructive pulmonary disease with acute lower respiratory infection: Secondary | ICD-10-CM

## 2020-05-10 DIAGNOSIS — G8929 Other chronic pain: Secondary | ICD-10-CM

## 2020-05-10 MED ORDER — PROAIR RESPICLICK 108 (90 BASE) MCG/ACT IN AEPB: 2.0000 | INHALATION_SPRAY | Freq: Four times a day (QID) | RESPIRATORY_TRACT | 1 refills | Status: DC | PRN

## 2020-05-10 MED ORDER — BUDESONIDE-FORMOTEROL FUMARATE 160-4.5 MCG/ACT IN AERO: 2.0000 | INHALATION_SPRAY | Freq: Two times a day (BID) | RESPIRATORY_TRACT | 1 refills | Status: DC

## 2020-05-10 MED ORDER — INCRUSE ELLIPTA 62.5 MCG/INH IN AEPB: 1.0000 | INHALATION_SPRAY | Freq: Every day | RESPIRATORY_TRACT | 1 refills | Status: DC

## 2020-05-10 MED ORDER — PREDNISONE 10 MG PO TABS: ORAL_TABLET | ORAL | 0 refills | Status: DC

## 2020-05-10 MED ORDER — ALBUTEROL SULFATE (2.5 MG/3ML) 0.083% IN NEBU: INHALATION_SOLUTION | RESPIRATORY_TRACT | 2 refills | Status: DC

## 2020-05-10 NOTE — Assessment & Plan Note (Signed)
COPD with acute bronchitis patient does have stage C COPD  We will repulsed prednisone refill all of his inhalers

## 2020-05-10 NOTE — Assessment & Plan Note (Signed)
  .   Current smoking consumption amount: Half a pack a day  . Dicsussion on advise to quit smoking and smoking impacts: Behavioral modification medication treatments  . Patient's willingness to quit: Wants to stay quit  . Methods to quit smoking discussed: He has failed nicotine replacement and Chantix  . Medication management of smoking session drugs discussed: No other pharmacologic approach is available  . Resources provided:  AVS   . Setting quit date not established  . Follow-up arranged 6 weeks   Time spent counseling the patient:

## 2020-05-10 NOTE — Progress Notes (Addendum)
Subjective:    Patient ID: Corey Lawrence, male    DOB: 10-Jan-1956, 65 y.o.   MRN: [id number] Virtual Visit via Telephone Note  I connected with Corey Lawrence on 05/10/20 at 11:00 AM EDT by telephone and verified that I am speaking with the correct person using two identifiers.   Consent:  I discussed the limitations, risks, security and privacy concerns of performing an evaluation and management service by telephone and the availability of in person appointments. I also discussed with the patient that there may be a patient responsible charge related to this service. The patient expressed understanding and agreed to proceed.  Location of patient: Patient's at his mother's home  Location of provider: I am in my office  Persons participating in the televisit with the patient.   No one else on the call  Note the patient was able to establish a video connection but he could not hear me and I could not hear him he could see me and I could see him because of the technical issues we switched to a phone visit    History of Present Illness: 02/17/18 This is a 65 year old male who has advanced chronic obstructive lung disease.  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  The patient had been on previously [facility] and Incruse but because after obtaining Medicaid for and formulary changes he was switched back to Symbicort and Spiriva.   Note unfortunately he is only been using the Symbicort once a day, and has run out of the Incruse and needs a refill.  Also a flutter valve was ordered the last visit but he never picked this up.   The patient continues to smoke a pack a week of cigarettes.  He has been trying to cut back and is using nicotine replacement therapy for this.  The patient states his dyspnea is getting progressively worse and he has had coughing up thick yellow mucus.  The mucus is difficult to raise.    The patient also has had chronic low  back pain that was exacerbated by a fall previously this summer from his porch.  He has an MRI of the spine that is pending and has not yet made the appointment for that that was scheduled.  He also has nodules in his chest which need follow-up from her previous CT August 2019.  Once again he failed to follow-up on the order for this scan.  The patient was asking for pain management from me but I indicated to him I am seeing him for his lungs that he should follow-up with his primary care provider regarding the pain management.  He states the gabapentin caused side effects and tramadol does not affect his pain.  He is firmly adamant he does not want higher dose opiates   He is in the hepatitis C clinic and had a recent ultrasound of the liver showing moderate risk for fibrosis but currently only hepatic steatosis and he is now starting antivirals for hepatitis C  12/29/2018 This is a follow-up visit for a patient with history of COPD with gold stage C prior smoking history history of lung nodules and right axillary lymphadenopathy.  Also the patient has chronic lumbar radiculopathy.  He had pending scans for the lung nodules and the neck and spine however he had not kept those appointments.  He has been traveling to the [place] part of the state to care for his mother  who is ill.  The patient's breathing is at baseline.  He states he is improved and states the flutter valve has been helpful in secretion removal.  The patient does maintain the Symbicort and Incruse The patient is still smoking about 10 cigarettes daily  01/06/2020 This patient is seen in return follow-up for severe COPD and is being seen on referral from Dr. Laural Benes.  Note the patient was able obtain a CT scan of the chest recently which showed resolution of all lung nodules in the chest and all he has now is just severe emphysema.  Note his adenopathy in the left and right axilla have resolved  The patient has had recurrent symptoms  of COPD exacerbations and in fact just finished a course of prednisone with a rapid taper.  However as the prednisone tapered off his symptoms recurred.  He is still smoking a half a pack a day of cigarettes at this time.  He is yet to receive his flu vaccine this season however he had received his Covid vaccines ending in September He was unable to receive the Incruse because it was not covered under his insurance plan he is on the Symbicort twice daily he does not have a spacer device.  05/10/2020 This is a 65 year old male seen in return follow-up for COPD as a consult for Dr. Laural Benes.  This was a video visit because his car broke down and he was staying with his [place] in  [place]  This patient states he had increased shortness of breath more recently he does complain of chronic low back pain he is still smoking a pack and a half of cigarettes every other day.  He failed Chantix he failed nicotine replacement  He needs refills on all his inhalers  He is requesting a formal pain management consult as were not able to provide opiates for this patient  Shortness of Breath This is a chronic problem. The current episode started more than 1 year ago. The problem occurs daily (any activity is dyspneic, walking or picking up an object ). The problem has been gradually worsening. Associated symptoms include neck pain and sputum production. Pertinent negatives include no chest pain, fever, headaches, hemoptysis, leg swelling, orthopnea, PND or wheezing. The symptoms are aggravated by lying flat, weather changes, emotional upset, exercise and any activity. Associated symptoms comments: Left arm numbness, mucus is green yellow. Risk factors include smoking. He has tried steroid inhalers and beta agonist inhalers for the symptoms. His past medical history is significant for COPD and pneumonia. There is no history of CAD, a heart failure or PE.   Past Medical  History:  Diagnosis Date  . Ankle fracture, left    "casted; no OR"  . Chronic lower back pain   . COPD (chronic obstructive pulmonary disease) (HCC)   . Lung nodules 09/12/2017  . Tobacco abuse      Family History  Problem Relation Age of Onset  . Esophageal cancer Father   . Colon cancer Sister   . Cancer - Other Brother   . Heart disease Mother   . Diabetes Mellitus II Mother   . Heart disease Other        multiple maternal family members  . Diabetes Mellitus II Other        multiple maternal family members     Social History   Socioeconomic History  . Marital status: Divorced    Spouse name: Not on file  .  Number of children: Not on file  . Years of education: Not on file  . Highest education level: Not on file  Occupational History  . Not on file  Tobacco Use  . Smoking status: Current Every Day Smoker    Packs/day: 0.25    Years: 43.00    Pack years: 10.75    Types: Cigarettes  . Smokeless tobacco: Never Used  . Tobacco comment: currently on patch  Substance and Sexual Activity  . 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 Drug use: Yes    Comment: 09/21/2013 "used marijuana once when I was 20; used cocaine  in ~ 2000; stopped in ~ 2003"  . Sexual activity: Not Currently  Other Topics Concern  . Not on file  Social History Narrative   Lives in trailer.  Not married.  5 children.     Social Determinants of Health   Financial Resource Strain: Not on file  Food Insecurity: Not on file  Transportation Needs: Not on file  Physical Activity: Not on file  Stress: Not on file  Social Connections: Not on file  Intimate Partner Violence: Not on file     No Known Allergies   Outpatient Medications Prior to Visit  Medication Sig Dispense Refill  . acetaminophen (TYLENOL) 325 MG tablet Take 2 tablets (650 mg total) by mouth every 6 (six) hours as needed for mild pain (or Fever >/= 101).    Marland Kitchen. aspirin  EC 81 MG tablet Take 1 tablet (81 mg total) by mouth daily. 100 tablet 1  . diphenhydramine-acetaminophen (TYLENOL PM) 25-500 MG TABS tablet Take 1 tablet by mouth at bedtime as needed.    . Multiple Vitamins-Minerals (MULTIVITAMIN ADULTS 50+) TABS Take 1 tablet by mouth daily.    Marland Kitchen. Respiratory Therapy Supplies (FLUTTER) DEVI Use 4 times daily 1 each 0  . traZODone (DESYREL) 100 MG tablet Take by mouth.    Marland Kitchen. albuterol (PROVENTIL) (2.5 MG/3ML) 0.083% nebulizer solution USE 1 VIAL VIA NEBULIZER EVERY 6 HOURS AS NEEDED FOR WHEEZING OR SHORTNESS OF BREATH 90 mL 2  . albuterol (VENTOLIN HFA) 108 (90 Base) MCG/ACT inhaler Inhale 1-2 puffs into the lungs every 6 (six) hours as needed for wheezing or shortness of breath. 18 g 0  . SYMBICORT 160-4.5 MCG/ACT inhaler Inhale 2 puffs into the lungs 2 (two) times daily. 10.2 g 6  . umeclidinium bromide (INCRUSE ELLIPTA) 62.5 MCG/INH AEPB Inhale into the lungs.    . varenicline (CHANTIX STARTING MONTH PAK) 0.5 MG X 11 & 1 MG X 42 tablet Take one 0.5 mg tablet by mouth once daily for 3 days, then increase to one 0.5 mg tablet twice daily for 4 days, then increase to one 1 mg tablet twice daily. (Patient not taking: Reported on 05/10/2020) 60 tablet 3  . predniSONE (DELTASONE) 10 MG tablet 4 tabs PO x 4 days then 2 tabs x 4 days (Patient not taking: Reported on 05/10/2020) 24 tablet 0  . traMADol (ULTRAM) 50 MG tablet Take by mouth. (Patient not taking: Reported on 05/10/2020)     No facility-administered medications prior to visit.      Review of Systems  Constitutional: Positive for activity change and fatigue. Negative for appetite change and fever.  HENT: Negative.   Eyes: Positive for visual disturbance.  Respiratory: Positive for cough, sputum production, chest tightness and shortness of breath. Negative for hemoptysis and wheezing.   Cardiovascular: Negative for  chest pain, orthopnea, leg swelling and PND.  Gastrointestinal: Negative.   Genitourinary:  Negative.   Musculoskeletal: Positive for back pain, gait problem, myalgias, neck pain and neck stiffness.  Skin: Negative.   Neurological: Positive for dizziness, weakness and numbness. Negative for tremors, syncope, facial asymmetry, speech difficulty, light-headedness and headaches.  Hematological: Positive for adenopathy.  Psychiatric/Behavioral: Negative.        Objective:   Physical Exam There were no vitals filed for this visit. No exam this is a phone visit CT of the chest was reviewed from August 2019 and showed scattered pulmonary nodules appear to be benign in nature Narrative   CT Chest from 12/07/19 Performed by [facility] INDICATION: Persistent cough..  COMPARISON: Chest x-ray 12/07/2019 2 hours prior. CT chest 03/13/2019.  TECHNIQUE: CT CHEST WO IV CONTRAST - Radiation dose reduction was utilized (automated exposure control, mA or kV adjustment based on patient size, or iterative image reconstruction).   FINDINGS:  # Heart: Heart size normal without pericardial effusion.  # Lymphadenopathy: No thoracic lymphadenopathy.  # Lungs: No focal consolidation. No pleural effusions. Emphysema.  # Pneumothorax: None.  # Bones: No acute fractures or dislocations. No osseous destructive lesions.      Assessment & Plan:  I personally reviewed all images and lab data in the [facility] system as well as any outside material available during this office visit and agree with the  radiology impressions.   COPD with acute bronchitis (HCC) COPD with acute bronchitis patient does have stage C COPD  We will repulsed prednisone refill all of his inhalers  Chronic low back pain Chronic pain syndrome we will refer this patient to [facility] as he does now have Medicaid  Tobacco dependence    . Current smoking consumption amount: Half a pack a day  . Dicsussion on advise to quit smoking and smoking impacts: Behavioral modification medication treatments  . Patient's  willingness to quit: Wants to stay quit  . Methods to quit smoking discussed: He has failed nicotine replacement and Chantix  . Medication management of smoking session drugs discussed: No other pharmacologic approach is available  . Resources provided:  AVS   . Setting quit date not established  . Follow-up arranged 6 weeks   Time spent counseling the patient:      Diagnoses and all orders for this visit:  COPD with acute bronchitis (HCC)  Chronic bilateral low back pain without sciatica  Tobacco dependence  Other orders -     Albuterol Sulfate (PROAIR RESPICLICK) 108 (90 Base) MCG/ACT AEPB; Inhale 2 puffs into the lungs every 6 (six) hours as needed. -     predniSONE (DELTASONE) 10 MG tablet; Take 4 tablets daily for 5 days then stop -     budesonide-formoterol (SYMBICORT) 160-4.5 MCG/ACT inhaler; Inhale 2 puffs into the lungs 2 (two) times daily. -     umeclidinium bromide (INCRUSE ELLIPTA) 62.5 MCG/INH AEPB; Inhale 1 puff into the lungs daily. -     albuterol (PROVENTIL) (2.5 MG/3ML) 0.083% nebulizer solution; USE 1 VIAL VIA NEBULIZER EVERY 6 HOURS AS NEEDED FOR WHEEZING OR SHORTNESS OF BREATH    33 MINUTES OF TIME SPENT TOTAL WITH THIS PATIENT  Follow Up Instructions:   Patient knows a pain management referral be sent for his chronic low back pain and also his inhalers will be refilled on a course of prednisone sent to his pharmacy near his mother's home he also knows a follow-up exam will be scheduled in 6 weeks face-to-face  I discussed the assessment and treatment plan with the patient. The patient was provided an opportunity to ask questions and all were answered. The patient agreed with the plan and demonstrated an understanding of the instructions.   The patient was advised to call back or seek an in-person evaluation if the symptoms worsen or if the condition fails to improve as anticipated.  I provided 33 minutes of non-face-to-face time during this encounter   including  median intraservice time , review of notes, labs, imaging, medications  and explaining diagnosis and management to the patient .    Shan Levans, MD

## 2020-05-10 NOTE — Assessment & Plan Note (Signed)
Chronic pain syndrome we will refer this patient to [facility] as he does now have Medicaid

## 2020-05-26 ENCOUNTER — Telehealth: Payer: Self-pay | Admitting: Internal Medicine

## 2020-05-26 DIAGNOSIS — J432 Centrilobular emphysema: Secondary | ICD-10-CM

## 2020-06-01 MED ORDER — ALBUTEROL SULFATE HFA 108 (90 BASE) MCG/ACT IN AERS: 2.0000 | INHALATION_SPRAY | Freq: Four times a day (QID) | RESPIRATORY_TRACT | 2 refills | Status: DC | PRN

## 2020-06-01 NOTE — Telephone Encounter (Signed)
Carollee Herter from the pharmacy called back to report that the Albuterol Sulfate (PROAIR RESPICLICK) 108 (90 Base) MCG/ACT AEPB   is not covered by his insurance. Please advise   Pt is completely out of his inhalers completely, he needs a 30 day supply.   [facility] - [place], [place] - [address]  [address] [place] [address]  Phone: [phone] Fax: [phone]

## 2020-06-01 NOTE — Telephone Encounter (Signed)
Done

## 2020-06-01 NOTE — Telephone Encounter (Signed)
Will forward to provider to change inhaler

## 2020-06-01 NOTE — Telephone Encounter (Signed)
Corey Lawrence, can you please change his inhaler to that which is preferred by his pharmacy?  Thank you

## 2020-06-01 NOTE — Addendum Note (Signed)
Addended by: Lois Huxley, Demarqus Jocson L on: 06/01/2020 02:00 PM   Modules accepted: Orders

## 2020-07-09 NOTE — Progress Notes (Signed)
Subjective:    Patient ID: Corey Lawrence, male    DOB: Nov 08, 1955, 65 y.o.   MRN: [id number]  History of Present Illness: 02/17/18 This is a 65 year old male who has advanced chronic obstructive lung disease.  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  The patient had been on previously [facility] and Incruse but because after obtaining Medicaid for and formulary changes he was switched back to Symbicort and Spiriva.   Note unfortunately he is only been using the Symbicort once a day, and has run out of the Incruse and needs a refill.  Also a flutter valve was ordered the last visit but he never picked this up.   The patient continues to smoke a pack a week of cigarettes.  He has been trying to cut back and is using nicotine replacement therapy for this.  The patient states his dyspnea is getting progressively worse and he has had coughing up thick yellow mucus.  The mucus is difficult to raise.    The patient also has had chronic low back pain that was exacerbated by a fall previously this summer from his porch.  He has an MRI of the spine that is pending and has not yet made the appointment for that that was scheduled.  He also has nodules in his chest which need follow-up from her previous CT August 2019.  Once again he failed to follow-up on the order for this scan.  The patient was asking for pain management from me but I indicated to him I am seeing him for his lungs that he should follow-up with his primary care provider regarding the pain management.  He states the gabapentin caused side effects and tramadol does not affect his pain.  He is firmly adamant he does not want higher dose opiates   He is in the hepatitis C clinic and had a recent ultrasound of the liver showing moderate risk for fibrosis but currently only hepatic steatosis and he is now starting antivirals for hepatitis C  12/29/2018 This is a follow-up visit for a patient with  history of COPD with gold stage C prior smoking history history of lung nodules and right axillary lymphadenopathy.  Also the patient has chronic lumbar radiculopathy.  He had pending scans for the lung nodules and the neck and spine however he had not kept those appointments.  He has been traveling to the [place] part of the state to care for his mother who is ill.  The patient's breathing is at baseline.  He states he is improved and states the flutter valve has been helpful in secretion removal.  The patient does maintain the Symbicort and Incruse The patient is still smoking about 10 cigarettes daily  01/06/2020 This patient is seen in return follow-up for severe COPD and is being seen on referral from Dr. Laural Benes.  Note the patient was able obtain a CT scan of the chest recently which showed resolution of all lung nodules in the chest and all he has now is just severe emphysema.  Note his adenopathy in the left and right axilla have resolved  The patient has had recurrent symptoms of COPD exacerbations and in fact just finished a course of prednisone with a rapid taper.  However as the prednisone tapered off his symptoms recurred.  He is still smoking a half a pack a day of cigarettes at this time.  He is yet to receive his flu  vaccine this season however he had received his Covid vaccines ending in September He was unable to receive the Incruse because it was not covered under his insurance plan he is on the Symbicort twice daily he does not have a spacer device.  05/10/2020 This is a 65 year old male seen in return follow-up for COPD as a consult for Dr. Laural Benes.  This was a video visit because his car broke down and he was staying with his mother in  [place]  This patient states he had increased shortness of breath more recently he does complain of chronic low back pain he is still smoking a pack and a half of cigarettes every other day.  He  failed Chantix he failed nicotine replacement  He needs refills on all his inhalers  He is requesting a formal pain management consult as were not able to provide opiates for this patient  07/10/2021 Patient presents today for a follow up visit regarding his COPD. The patient states over the past 2 weeks his symptoms have been worsening. He has been experiencing worsening cough, shortness of breath, wheezing, chest tightness, and yellow to green sputum production. He denies any chest pain. The patient's mother moved to [place] two weeks ago. The patient has been having to walk up and down stairs because his mother now lives in an upstairs apartment. Due to the increased symptoms the patient has been using his inhalers more than usual. Over the past 5 days, the patient has run out of his Symbicort and albuterol. Since running out of his inhalers the patient's symptoms have become more severe. The patient smokes 1.5 packs of cigarettes each week. The patient feels he needs a course of steroids today. He has been using a friend's Flovent inhaler as a rescue inhaler since running out of his albuterol.   The patient also has been experiencing chronic knee and back pain. He takes Tylenol with some relief. He has been referred to the pain clinic in the past but has not made an appointment yet.   The patient has been experiencing dizzy episodes 1-2 times per week. During these episodes he feels as though the room is spinning around him. These episodes resolve on their own. See shortness of breath assessment below  Shortness of Breath This is a chronic problem. The current episode started more than 1 year ago. The problem occurs daily (any activity is dyspneic, walking or picking up an object ). The problem has been rapidly worsening (ran out of inhalers one week ago). Associated symptoms include neck pain, sputum production and wheezing. Pertinent negatives include no chest pain, fever, headaches, hemoptysis,  leg pain, leg swelling, orthopnea, PND, rhinorrhea or vomiting. The symptoms are aggravated by lying flat, weather changes, emotional upset, exercise and any activity. Associated symptoms comments: Left arm numbness, mucus is  yellow. Risk factors include smoking. He has tried steroid inhalers and beta agonist inhalers for the symptoms. His past medical history is significant for COPD and pneumonia. There is no history of CAD, a heart failure or PE.   Past Medical History:  Diagnosis Date  . Ankle fracture, left    "casted; no OR"  . Chronic lower back pain   . COPD (chronic obstructive pulmonary disease) (HCC)   . Lung nodules 09/12/2017  . Tobacco abuse      Family History  Problem Relation Age of Onset  . Esophageal cancer Father   . Colon cancer Sister   .  Cancer - Other Brother   . Heart disease Mother   . Diabetes Mellitus II Mother   . Heart disease Other        multiple maternal family members  . Diabetes Mellitus II Other        multiple maternal family members     Social History   Socioeconomic History  . Marital status: Divorced    Spouse name: Not on file  . Number of children: Not on file  . Years of education: Not on file  . Highest education level: Not on file  Occupational History  . Not on file  Tobacco Use  . Smoking status: Current Every Day Smoker    Packs/day: 0.25    Years: 43.00    Pack years: 10.75    Types: Cigarettes  . Smokeless tobacco: Never Used  . Tobacco comment: currently on patch  Substance and Sexual Activity  . 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 Drug use: Yes    Comment: 09/21/2013 "used marijuana once when I was 20; used cocaine  in ~ 2000; stopped in ~ 2003"  . Sexual activity: Not Currently  Other Topics Concern  . Not on file  Social History Narrative   Lives in trailer.  Not married.  5 children.     Social Determinants of Health   Financial Resource Strain:  Not on file  Food Insecurity: Not on file  Transportation Needs: Not on file  Physical Activity: Not on file  Stress: Not on file  Social Connections: Not on file  Intimate Partner Violence: Not on file     No Known Allergies   Outpatient Medications Prior to Visit  Medication Sig Dispense Refill  . acetaminophen (TYLENOL) 325 MG tablet Take 2 tablets (650 mg total) by mouth every 6 (six) hours as needed for mild pain (or Fever >/= 101).    Marland Kitchen. aspirin EC 81 MG tablet Take 1 tablet (81 mg total) by mouth daily. 100 tablet 1  . diphenhydramine-acetaminophen (TYLENOL PM) 25-500 MG TABS tablet Take 1 tablet by mouth at bedtime as needed.    . Multiple Vitamins-Minerals (MULTIVITAMIN ADULTS 50+) TABS Take 1 tablet by mouth daily.    Marland Kitchen. Respiratory Therapy Supplies (FLUTTER) DEVI Use 4 times daily 1 each 0  . traZODone (DESYREL) 100 MG tablet Take by mouth.    Marland Kitchen. albuterol (PROAIR HFA) 108 (90 Base) MCG/ACT inhaler Inhale 2 puffs into the lungs every 6 (six) hours as needed for wheezing or shortness of breath. 8.5 g 2  . albuterol (PROVENTIL) (2.5 MG/3ML) 0.083% nebulizer solution USE 1 VIAL VIA NEBULIZER EVERY 6 HOURS AS NEEDED FOR WHEEZING OR SHORTNESS OF BREATH 90 mL 2  . umeclidinium bromide (INCRUSE ELLIPTA) 62.5 MCG/INH AEPB Inhale 1 puff into the lungs daily. 90 each 1  . budesonide-formoterol (SYMBICORT) 160-4.5 MCG/ACT inhaler Inhale 2 puffs into the lungs 2 (two) times daily. (Patient not taking: Reported on 07/10/2020) 3 each 1  . predniSONE (DELTASONE) 10 MG tablet Take 4 tablets daily for 5 days then stop (Patient not taking: Reported on 07/10/2020) 20 tablet 0  . varenicline (CHANTIX STARTING MONTH PAK) 0.5 MG X 11 & 1 MG X 42 tablet Take one 0.5 mg tablet by mouth once daily for 3 days, then increase to one 0.5 mg tablet twice daily for 4 days, then increase to one 1 mg tablet twice daily. (Patient not taking: No sig reported)  60 tablet 3   No facility-administered medications prior to  visit.      Review of Systems  Constitutional: Positive for activity change and fatigue. Negative for appetite change and fever.  HENT: Negative.  Negative for rhinorrhea.   Eyes: Positive for visual disturbance.  Respiratory: Positive for cough, sputum production, chest tightness, shortness of breath and wheezing. Negative for hemoptysis.   Cardiovascular: Negative for chest pain, orthopnea, leg swelling and PND.  Gastrointestinal: Negative.  Negative for nausea and vomiting.  Genitourinary: Negative.  Negative for difficulty urinating.  Musculoskeletal: Positive for back pain, gait problem, myalgias, neck pain and neck stiffness.  Skin: Negative.   Neurological: Positive for dizziness, weakness and numbness. Negative for tremors, syncope, facial asymmetry, speech difficulty, light-headedness and headaches.  Hematological: Positive for adenopathy.  Psychiatric/Behavioral: Negative.        Objective:   Physical Exam Constitutional:      Appearance: Normal appearance.  HENT:     Head: Normocephalic and atraumatic.     Mouth/Throat:     Mouth: Mucous membranes are moist.     Pharynx: Oropharynx is clear.  Eyes:     Conjunctiva/sclera: Conjunctivae normal.  Cardiovascular:     Rate and Rhythm: Normal rate and regular rhythm.     Heart sounds: Normal heart sounds.  Pulmonary:     Breath sounds: Wheezing (Bilateral inspiratory and expiratory wheezes in all lung fields. Some improvement after albuterol inhaler use) present.     Comments: Increased work of breathing before albuterol inhaler use Abdominal:     General: Bowel sounds are normal. There is no distension.     Palpations: Abdomen is soft.     Tenderness: There is no abdominal tenderness.  Musculoskeletal:        General: Normal range of motion.     Cervical back: Normal range of motion.  Skin:    General: Skin is warm.  Neurological:     Mental Status: He is alert and oriented to person, place, and time.   Psychiatric:        Mood and Affect: Mood normal.        Behavior: Behavior normal.    Vitals:   07/10/20 1413  BP: 129/69  Pulse: 70  SpO2: 99%  Weight: 124 lb 12.8 oz (56.6 kg)  Height: 5\' 2"  (1.575 m)    CT Chest from 12/07/19 Performed by [facility] INDICATION: Persistent cough..  COMPARISON: Chest x-ray 12/07/2019 2 hours prior. CT chest 03/13/2019.  TECHNIQUE: CT CHEST WO IV CONTRAST - Radiation dose reduction was utilized (automated exposure control, mA or kV adjustment based on patient size, or iterative image reconstruction).   FINDINGS:  # Heart: Heart size normal without pericardial effusion.  # Lymphadenopathy: No thoracic lymphadenopathy.  # Lungs: No focal consolidation. No pleural effusions. Emphysema.  # Pneumothorax: None.  # Bones: No acute fractures or dislocations. No osseous destructive lesions.      Assessment & Plan:  I personally reviewed all images and lab data in the [facility] system as well as any outside material available during this office visit and agree with the  radiology impressions.   COPD with acute bronchitis (HCC) Take Symbicort two puffs twice daily as prescribed. Take Incruse one puff daily as prescribed.  Use albuterol 2 puffs every 4-6 hours as needed. Refilled all medications and send order for new nebulizer machine today. Take prednisone 4 tabs over the next 5 days.  Follow up in 4 months.   Tobacco dependence    .  Current smoking consumption amount: Half a pack a day to one PPD  . Dicsussion on advise to quit smoking and smoking impacts: Behavioral modification medication treatments  . Patient's willingness to quit: a lot of stress in his life a barrier to quit  . Methods to quit smoking discussed: He has failed nicotine replacement and Chantix  . Medication management of smoking session drugs discussed: No other pharmacologic approach is available  . Resources provided:  AVS   . Setting quit date not  established  . Follow-up arranged 6 weeks   Time spent counseling the patient:      Corey Lawrence was seen today for copd.  Diagnoses and all orders for this visit:  COPD exacerbation (HCC) -     For home use only DME Nebulizer machine -     albuterol (VENTOLIN HFA) 108 (90 Base) MCG/ACT inhaler 4 puff  Aortic atherosclerosis (HCC)  Hep C w/o coma, chronic (HCC)  COPD with acute bronchitis (HCC)  Tobacco dependence  Other orders -     albuterol (PROAIR HFA) 108 (90 Base) MCG/ACT inhaler; Inhale 2 puffs into the lungs every 6 (six) hours as needed for wheezing or shortness of breath. -     albuterol (PROVENTIL) (2.5 MG/3ML) 0.083% nebulizer solution; USE 1 VIAL VIA NEBULIZER EVERY 6 HOURS AS NEEDED FOR WHEEZING OR SHORTNESS OF BREATH -     budesonide-formoterol (SYMBICORT) 160-4.5 MCG/ACT inhaler; Inhale 2 puffs into the lungs 2 (two) times daily. -     predniSONE (DELTASONE) 10 MG tablet; Take 4 tablets daily for 5 days then stop -     umeclidinium bromide (INCRUSE ELLIPTA) 62.5 MCG/INH AEPB; Inhale 1 puff into the lungs daily.

## 2020-07-10 ENCOUNTER — Other Ambulatory Visit: Payer: Self-pay

## 2020-07-10 ENCOUNTER — Encounter: Payer: Self-pay | Admitting: Critical Care Medicine

## 2020-07-10 ENCOUNTER — Ambulatory Visit: Payer: Medicaid Other | Attending: Critical Care Medicine | Admitting: Critical Care Medicine

## 2020-07-10 VITALS — BP 129/69 | HR 70 | Ht 62.0 in | Wt 124.8 lb

## 2020-07-10 DIAGNOSIS — J441 Chronic obstructive pulmonary disease with (acute) exacerbation: Secondary | ICD-10-CM

## 2020-07-10 DIAGNOSIS — B182 Chronic viral hepatitis C: Secondary | ICD-10-CM

## 2020-07-10 DIAGNOSIS — F17200 Nicotine dependence, unspecified, uncomplicated: Secondary | ICD-10-CM

## 2020-07-10 DIAGNOSIS — I700 Atherosclerosis of aorta: Secondary | ICD-10-CM

## 2020-07-10 DIAGNOSIS — J209 Acute bronchitis, unspecified: Secondary | ICD-10-CM

## 2020-07-10 DIAGNOSIS — J440 Chronic obstructive pulmonary disease with acute lower respiratory infection: Secondary | ICD-10-CM | POA: Diagnosis not present

## 2020-07-10 MED ORDER — ALBUTEROL SULFATE HFA 108 (90 BASE) MCG/ACT IN AERS
2.0000 | INHALATION_SPRAY | Freq: Four times a day (QID) | RESPIRATORY_TRACT | 2 refills | Status: DC | PRN
  Filled 2020-07-10: qty 18, 25d supply, fill #0

## 2020-07-10 MED ORDER — ALBUTEROL SULFATE (2.5 MG/3ML) 0.083% IN NEBU
INHALATION_SOLUTION | RESPIRATORY_TRACT | 2 refills | Status: DC
  Filled 2020-07-10: qty 90, 7d supply, fill #0

## 2020-07-10 MED ORDER — PREDNISONE 10 MG PO TABS
ORAL_TABLET | ORAL | 0 refills | Status: DC
  Filled 2020-07-10: qty 20, 5d supply, fill #0

## 2020-07-10 MED ORDER — BUDESONIDE-FORMOTEROL FUMARATE 160-4.5 MCG/ACT IN AERO
2.0000 | INHALATION_SPRAY | Freq: Two times a day (BID) | RESPIRATORY_TRACT | 1 refills | Status: DC
  Filled 2020-07-10: qty 10.2, 30d supply, fill #0

## 2020-07-10 MED ORDER — INCRUSE ELLIPTA 62.5 MCG/INH IN AEPB
1.0000 | INHALATION_SPRAY | Freq: Every day | RESPIRATORY_TRACT | 1 refills | Status: DC
  Filled 2020-07-10: qty 30, 30d supply, fill #0

## 2020-07-10 MED ORDER — ALBUTEROL SULFATE HFA 108 (90 BASE) MCG/ACT IN AERS
4.0000 | INHALATION_SPRAY | Freq: Once | RESPIRATORY_TRACT | Status: AC
  Administered 2020-07-10: 4 via RESPIRATORY_TRACT

## 2020-07-10 NOTE — Progress Notes (Signed)
Nees refill on inhaler today. Prefers Ventalin inhaler.

## 2020-07-10 NOTE — Assessment & Plan Note (Addendum)
Take Symbicort two puffs twice daily as prescribed. Take Incruse one puff daily as prescribed.  Use albuterol 2 puffs every 4-6 hours as needed. Refilled all medications and send order for new nebulizer machine today. Take prednisone 4 tabs over the next 5 days.  Follow up in 4 months.

## 2020-07-10 NOTE — Patient Instructions (Signed)
A new nebulizer will be ordered  Refills on all of your inhalers sent to our pharmacy here at the clinic and a course of prednisone was sent to our clinic pharmacy  No change in other medications  Return to see Dr. Delford Field in 4 months

## 2020-07-10 NOTE — Assessment & Plan Note (Signed)
  .   Current smoking consumption amount: Half a pack a day to one PPD  . Dicsussion on advise to quit smoking and smoking impacts: Behavioral modification medication treatments  . Patient's willingness to quit: a lot of stress in his life a barrier to quit  . Methods to quit smoking discussed: He has failed nicotine replacement and Chantix  . Medication management of smoking session drugs discussed: No other pharmacologic approach is available  . Resources provided:  AVS   . Setting quit date not established  . Follow-up arranged 6 weeks   Time spent counseling the patient:  

## 2020-09-21 ENCOUNTER — Other Ambulatory Visit: Payer: Self-pay | Admitting: *Deleted

## 2020-09-21 ENCOUNTER — Other Ambulatory Visit: Payer: Self-pay | Admitting: Internal Medicine

## 2020-09-21 ENCOUNTER — Ambulatory Visit: Payer: Self-pay | Admitting: *Deleted

## 2020-09-21 NOTE — Telephone Encounter (Signed)
Duplicate request from today

## 2020-09-21 NOTE — Telephone Encounter (Signed)
Called patient to review need for request to refill prednisone. C/o difficulty coughing up thick yellow phlegm x 3 days. Reports "it is getting hard to cough up " "phlegm". "Thought I was going to die last night because it got hung" in throat. Patient reports he has been using nebulizer more and flutter valve to try to get thick phlegm out. C/o wheezing and shortness of breath. Recommended UC or ED for worsening symptoms . Patient requesting PCP give prednisone .Patient also requesting a call back from clinic to give a reference # for insurance for [place] [facility] for [facility] medical . Please advise. Care advise given. Patient verbalized understanding of care advise and to call back or go to [facility] or ED or call 911 if symptoms worsen. Please advise

## 2020-09-21 NOTE — Telephone Encounter (Signed)
Medication Refill - Medication: predniSONE (DELTASONE) 10 MG tablet  Has the patient contacted their pharmacy? No. (Agent: If no, request that the patient contact the pharmacy for the refill.) (Agent: If yes, when and what did the pharmacy advise?)  Preferred Pharmacy (with phone number or street name):  [id number] - [place], [place] - [address] AT [facility] [address] Phone:  [phone]  Fax:  [phone]      Agent: Please be advised that RX refills may take up to 3 business days. We ask that you follow-up with your pharmacy.

## 2020-09-21 NOTE — Telephone Encounter (Signed)
Requesting prednisone due to wheezing and increase in thick yellow phlegm and difficulty coughing up.

## 2020-09-21 NOTE — Telephone Encounter (Signed)
Reason for Disposition  [1] Longstanding difficulty breathing (e.g., CHF, COPD, emphysema) AND [2] WORSE than normal  Answer Assessment - Initial Assessment Questions 1. RESPIRATORY STATUS: "Describe your breathing?" (e.g., wheezing, shortness of breath, unable to speak, severe coughing)      Wheezing, shortness of breath, coughing up thick yellow phlegm  2. ONSET: "When did this breathing problem begin?"      3 days ago  3. PATTERN "Does the difficult breathing come and go, or has it been constant since it started?"      Getting worse 4. SEVERITY: "How bad is your breathing?" (e.g., mild, moderate, severe)    - MILD: No SOB at rest, mild SOB with walking, speaks normally in sentences, can lie down, no retractions, pulse < 100.    - MODERATE: SOB at rest, SOB with minimal exertion and prefers to sit, cannot lie down flat, speaks in phrases, mild retractions, audible wheezing, pulse 100-120.    - SEVERE: Very SOB at rest, speaks in single words, struggling to breathe, sitting hunched forward, retractions, pulse > 120      na 5. RECURRENT SYMPTOM: "Have you had difficulty breathing before?" If Yes, ask: "When was the last time?" and "What happened that time?"      Yes  6. CARDIAC HISTORY: "Do you have any history of heart disease?" (e.g., heart attack, angina, bypass surgery, angioplasty)      Na  7. LUNG HISTORY: "Do you have any history of lung disease?"  (e.g., pulmonary embolus, asthma, emphysema)     COPD 8. CAUSE: "What do you think is causing the breathing problem?"      Thick yellow phlegm  9. OTHER SYMPTOMS: "Do you have any other symptoms? (e.g., dizziness, runny nose, cough, chest pain, fever)     Difficulty coughing up think yellow phlegm 10. O2 SATURATION MONITOR:  "Do you use an oxygen saturation monitor (pulse oximeter) at home?" If Yes, "What is your reading (oxygen level) today?" "What is your usual oxygen saturation reading?" (e.g., 95%)       na 11. PREGNANCY: "Is there  any chance you are pregnant?" "When was your last menstrual period?"       na 12. TRAVEL: "Have you traveled out of the country in the last month?" (e.g., travel history, exposures)       na  Protocols used: Breathing Difficulty-A-AH

## 2020-09-21 NOTE — Addendum Note (Signed)
Addended by: Cleotilde Neer on: 09/21/2020 06:34 PM   Modules accepted: Orders

## 2020-09-21 NOTE — Telephone Encounter (Signed)
Patient called, left VM to return the call to speak to a nurse about why he needs a refill on prednisone. Noted to stop after 5 days per mediation profile.

## 2020-09-21 NOTE — Telephone Encounter (Signed)
Requested medication (s) are due for refill today: yes  Requested medication (s) are on the active medication list: yes  Last refill:  07/10/20 #20 0 refill  Future visit scheduled: yes in 2 months   Notes to clinic:  not delegated per protocol . C/o increase in difficulty coughing up thick yellow phlegm.      Requested Prescriptions  Pending Prescriptions Disp Refills   predniSONE (DELTASONE) 10 MG tablet 20 tablet 0    Sig: Take 4 tablets daily for 5 days then stop     Not Delegated - Endocrinology:  Oral Corticosteroids Failed - 09/21/2020  6:34 PM      Failed - This refill cannot be delegated      Passed - Last BP in normal range    BP Readings from Last 1 Encounters:  07/10/20 129/69          Passed - Valid encounter within last 6 months    Recent Outpatient Visits           2 months ago COPD exacerbation [facility])   [facility] Storm Frisk, MD   4 months ago COPD with acute bronchitis [facility])   [facility] Storm Frisk, MD   8 months ago COPD with acute bronchitis [facility])   [facility] Storm Frisk, MD   8 months ago Hyperglycemia   [facility] Swords, Valetta Mole, MD   1 year ago Lumbar radiculopathy   [facility] Marcine Matar, MD       Future Appointments             In 2 months Delford Field Charlcie Cradle, MD [facility]

## 2020-09-21 NOTE — Telephone Encounter (Signed)
Called patient to review need for prednisone . Patient reports he has had sx x 3 days of coughing and getting strangled by thick yellow phlegm. Wheezing and shortness of breath reported. Using nebulizer, flutter valve as directed and continues to have difficulty coughing up thick yellow phlegm.

## 2020-09-22 ENCOUNTER — Other Ambulatory Visit: Payer: Self-pay

## 2020-09-22 MED ORDER — PREDNISONE 10 MG PO TABS: ORAL_TABLET | ORAL | 0 refills | Status: DC

## 2020-09-22 MED ORDER — PREDNISONE 10 MG PO TABS
ORAL_TABLET | ORAL | 0 refills | Status: DC
  Filled 2020-09-22: qty 20, 5d supply, fill #0

## 2020-09-22 NOTE — Telephone Encounter (Signed)
Contacted pt and made aware of rx being sent. Pt requested rx being resent to [facility] on [address]. Rx has been resent

## 2020-09-22 NOTE — Addendum Note (Signed)
Addended by: Jonah Blue B on: 09/22/2020 11:44 AM   Modules accepted: Orders

## 2020-09-22 NOTE — Telephone Encounter (Signed)
Will forward to provider  

## 2020-09-25 ENCOUNTER — Other Ambulatory Visit: Payer: Self-pay

## 2020-09-27 ENCOUNTER — Other Ambulatory Visit: Payer: Self-pay

## 2020-10-04 ENCOUNTER — Other Ambulatory Visit: Payer: Self-pay | Admitting: Internal Medicine

## 2020-10-04 NOTE — Telephone Encounter (Signed)
Requested Prescriptions  Pending Prescriptions Disp Refills  . albuterol (PROVENTIL) (2.5 MG/3ML) 0.083% nebulizer solution [Pharmacy Med Name: ALBUTEROL 0.083%(2.5MG /3ML) 30X3ML] 90 mL 2    Sig: USE 1 VIAL VIA NEBULIZER EVERY 6 HOURS AS NEEDED FOR WHEEZING OR SHORTNESS OF BREATH     Pulmonology:  Beta Agonists Failed - 10/04/2020  8:59 PM      Failed - One inhaler should last at least one month. If the patient is requesting refills earlier, contact the patient to check for uncontrolled symptoms.      Passed - Valid encounter within last 12 months    Recent Outpatient Visits          2 months ago COPD exacerbation [facility])   [facility] Frisk, MD   4 months ago COPD with acute bronchitis [facility])   [place] [facility] Storm Frisk, MD   9 months ago COPD with acute bronchitis [facility])   [place] Storm Frisk, MD   9 months ago Hyperglycemia   [facility] Swords, Valetta Mole, MD   1 year ago Lumbar radiculopathy   [facility] Marcine Matar, MD      Future Appointments            In 1 month Delford Field Charlcie Cradle, MD [facility]

## 2020-11-01 ENCOUNTER — Telehealth: Payer: Self-pay | Admitting: Internal Medicine

## 2020-11-01 NOTE — Telephone Encounter (Signed)
Patient called and asked why does he need the prednisone and azithromycin. He says last month he called because of coughing up phlegm and SOB from his COPD. He says he took them both, but feels like he needs another round and more of the prednisone. He says he doesn't feel any better and is trying to prevent pneumonia. He says Dr. Delford Field is his lung doctor and will prescribe the prednisone and Dr. Laural Benes will prescribe the antibiotic. I advised he may need to be seen in the ED or UC, he says he doesn't know if his insurance will pay for him. He says he doesn't feel like he needs to go to the hospital at this time. He says he is using the proair inhaler more than normal and he's is running out sooner than 1 month and the insurance will not pay for more than one month. He says if he could get Ventolin instead of ProAir that would be better, because Ventolin works better for him. He also says he would like a referral to Dr. Nancy Marus at [facility] in [place], Vein and Vascular. I advised I will send this to Dr. Laural Benes that Dr. Delford Field is out of the office and someone will call back with her recommendation, patient verbalized understanding.

## 2020-11-01 NOTE — Telephone Encounter (Unsigned)
Copied from CRM [id number]. Topic: Quick Communication - Rx Refill/Question >> Nov 01, 2020  8:52 AM Gaetana Michaelis A wrote: Medication: predniSONE (DELTASONE) 10 MG tablet [[id number]]   azithromycin (ZITHROMAX) 250 MG tablet [[id number]]   Has the patient contacted their pharmacy? Yes.   (Agent: If no, request that the patient contact the pharmacy for the refill.) (Agent: If yes, when and what did the pharmacy advise?)  Preferred Pharmacy (with phone number or street name): [id number] - [place], [place] - [address]  Phone:  [phone] Fax:  [phone]  Has the patient been seen for an appointment in the last year OR does the patient have an upcoming appointment? Yes.    Agent: Please be advised that RX refills may take up to 3 business days. We ask that you follow-up with your pharmacy.

## 2020-11-02 ENCOUNTER — Other Ambulatory Visit: Payer: Self-pay | Admitting: Nurse Practitioner

## 2020-11-02 MED ORDER — PREDNISONE 10 MG PO TABS
ORAL_TABLET | ORAL | 0 refills | Status: DC
  Filled 2020-11-02: qty 20, 5d supply, fill #0

## 2020-11-02 NOTE — Telephone Encounter (Signed)
I have sent prednisone. His insurance does not cover ventolin. Also he will need to follow up with Dr Laural Benes regarding referral for to Dr. Nancy Marus. I do not see anything in his chart as to why he needs a referral to Vein and vascular.

## 2020-11-02 NOTE — Telephone Encounter (Signed)
Will forward to covering provider.

## 2020-11-03 ENCOUNTER — Other Ambulatory Visit: Payer: Self-pay

## 2020-11-07 NOTE — Telephone Encounter (Signed)
Contacted pt to go over provider message pt didn't answer lvm  

## 2020-11-10 ENCOUNTER — Other Ambulatory Visit: Payer: Self-pay

## 2020-11-13 ENCOUNTER — Other Ambulatory Visit: Payer: Self-pay

## 2020-11-13 ENCOUNTER — Other Ambulatory Visit: Payer: Self-pay | Admitting: Internal Medicine

## 2020-11-13 NOTE — Telephone Encounter (Signed)
Medication Refill - Medication: budesonide-formoterol (SYMBICORT) 160-4.5 MCG/ACT inhaler   Has the patient contacted their pharmacy? Yes.   (Agent: If no, request that the patient contact the pharmacy for the refill.) (Agent: If yes, when and what did the pharmacy advise?)request sent with no response last week   Preferred Pharmacy (with phone number or street name):  [id number] - [place], [place] - [address] Phone:  [phone]  Fax:  [phone]     Has the patient been seen for an appointment in the last year OR does the patient have an upcoming appointment? Yes.    Agent: Please be advised that RX refills may take up to 3 business days. We ask that you follow-up with your pharmacy.

## 2020-11-14 NOTE — Telephone Encounter (Signed)
Requested medication (s) are due for refill today  Yes  Requested medication (s) are on the active medication list Yes  Future visit scheduled Yes for 11/27/20  Note to clinic-Prescription has expired-needs current prescription sent in. This is the patient's 2nd request. Routing to clinic for new prescription.   Requested Prescriptions  Pending Prescriptions Disp Refills   budesonide-formoterol (SYMBICORT) 160-4.5 MCG/ACT inhaler 30.6 g 1    Sig: Inhale 2 puffs into the lungs 2 (two) times daily.     Pulmonology:  Combination Products Passed - 11/13/2020  7:20 PM      Passed - Valid encounter within last 12 months    Recent Outpatient Visits           4 months ago COPD exacerbation [facility])   [facility], MD   6 months ago COPD with acute bronchitis [facility])   [place] [facility] Storm Frisk, MD   10 months ago COPD with acute bronchitis [facility])   [place] [facility] Storm Frisk, MD   10 months ago Hyperglycemia   [facility] Swords, Valetta Mole, MD   1 year ago Lumbar radiculopathy   [facility] Marcine Matar, MD       Future Appointments             In 1 week Delford Field Charlcie Cradle, MD [facility]

## 2020-11-15 MED ORDER — BUDESONIDE-FORMOTEROL FUMARATE 160-4.5 MCG/ACT IN AERO: 2.0000 | INHALATION_SPRAY | Freq: Two times a day (BID) | RESPIRATORY_TRACT | 0 refills | Status: DC

## 2020-11-16 ENCOUNTER — Other Ambulatory Visit: Payer: Self-pay | Admitting: Critical Care Medicine

## 2020-11-17 NOTE — Telephone Encounter (Signed)
Refused due to already sent on 11/15/2020.  Requested Prescriptions  Refused Prescriptions Disp Refills  . SYMBICORT 160-4.5 MCG/ACT inhaler [Pharmacy Med Name: SYMBICORT 160/4. (120 ORAL INH)] 10.2 g 0    Sig: INHALE 2 PUFFS TWICE DAILY     Pulmonology:  Combination Products Passed - 11/16/2020 10:03 AM      Passed - Valid encounter within last 12 months    Recent Outpatient Visits          4 months ago COPD exacerbation [facility])   [place] [facility], MD   6 months ago COPD with acute bronchitis [facility])   [place] [facility] Storm Frisk, MD   10 months ago COPD with acute bronchitis [facility])   [facility] Storm Frisk, MD   10 months ago Hyperglycemia   [facility] Swords, Valetta Mole, MD   1 year ago Lumbar radiculopathy   [facility] Marcine Matar, MD      Future Appointments            In 1 week Storm Frisk, MD [facility]           . budesonide-formoterol (SYMBICORT) 160-4.5 MCG/ACT inhaler [Pharmacy Med Name: BUDESONIDE/FORM 160/4.5MCG(120 INH)] 10.2 g 0    Sig: INHALE 2 PUFFS TWICE DAILY     Pulmonology:  Combination Products Passed - 11/16/2020 10:03 AM      Passed - Valid encounter within last 12 months    Recent Outpatient Visits          4 months ago COPD exacerbation [facility])   [place] [facility]isk, MD   6 months ago COPD with acute bronchitis [facility])   [place] [facility] Storm Frisk, MD   10 months ago COPD with acute bronchitis [facility])   [facility] Storm Frisk, MD   10 months ago Hyperglycemia   [facility] Swords, Valetta Mole, MD   1 year ago Lumbar radiculopathy   [facility] Marcine Matar, MD       Future Appointments            In 1 week Storm Frisk, MD [facility]           . budesonide-formoterol (SYMBICORT) 160-4.5 MCG/ACT inhaler [Pharmacy Med Name: BUDESONIDE/FORM 160/4.5MCG(120 INH)] 10.2 g 0    Sig: INHALE 2 PUFFS TWICE DAILY     Pulmonology:  Combination Products Passed - 11/16/2020 10:03 AM      Passed - Valid encounter within last 12 months    Recent Outpatient Visits          4 months ago COPD exacerbation [facility])   [place] [facility] Frisk, MD   6 months ago COPD with acute bronchitis [facility])   [place] [facility] Storm Frisk, MD   10 months ago COPD with acute bronchitis [facility])   [facility] Storm Frisk, MD   10 months ago Hyperglycemia   [facility] Swords, Valetta Mole, MD   1 year ago Lumbar radiculopathy   [place] Marcine Matar, MD      Future Appointments  In 1 week Storm Frisk, MD [facility] And [facility]

## 2020-11-24 ENCOUNTER — Other Ambulatory Visit: Payer: Self-pay | Admitting: Internal Medicine

## 2020-11-24 MED ORDER — ALBUTEROL SULFATE (2.5 MG/3ML) 0.083% IN NEBU: INHALATION_SOLUTION | RESPIRATORY_TRACT | 2 refills | Status: DC

## 2020-11-24 NOTE — Telephone Encounter (Signed)
Copied from CRM [id number]. Topic: Quick Communication - Rx Refill/Question >> Nov 24, 2020  8:49 AM Jaquita Rector A wrote: Medication: albuterol (PROVENTIL) (2.5 MG/3ML) 0.083% nebulizer solution   Has the patient contacted their pharmacy? Yes.  Was told there are no more refills from last Rx  (Agent: If no, request that the patient contact the pharmacy for the refill.) (Agent: If yes, when and what did the pharmacy advise?)  Preferred Pharmacy (with phone number or street name): [id number] - [place], [place] - [address]  Phone:  [phone] Fax:  [phone]    Has the patient been seen for an appointment in the last year OR does the patient have an upcoming appointment? Yes.    Agent: Please be advised that RX refills may take up to 3 business days. We ask that you follow-up with your pharmacy.

## 2020-11-24 NOTE — Telephone Encounter (Signed)
Requested Prescriptions  Pending Prescriptions Disp Refills  . albuterol (PROVENTIL) (2.5 MG/3ML) 0.083% nebulizer solution 90 mL 2    Sig: USE 1 VIAL VIA NEBULIZER EVERY 6 HOURS AS NEEDED FOR WHEEZING OR SHORTNESS OF BREATH     Pulmonology:  Beta Agonists Failed - 11/24/2020  6:25 PM      Failed - One inhaler should last at least one month. If the patient is requesting refills earlier, contact the patient to check for uncontrolled symptoms.      Passed - Valid encounter within last 12 months    Recent Outpatient Visits          4 months ago COPD exacerbation [facility])   [place] [facility]isk, MD   6 months ago COPD with acute bronchitis [facility])   [place] [facility] Storm Frisk, MD   10 months ago COPD with acute bronchitis [facility])    [facility] Storm Frisk, MD   11 months ago Hyperglycemia   [facility] Swords, Valetta Mole, MD   1 year ago Lumbar radiculopathy   [facility] Marcine Matar, MD      Future Appointments            In 3 days Storm Frisk, MD [facility]

## 2020-11-26 NOTE — Progress Notes (Signed)
Established Patient Office Visit  Subjective:  Patient ID: Corey Lawrence, male    DOB: 1955/08/31  Age: 65 y.o. MRN: [id number]  CC:  Chief Complaint  Patient presents with   COPD   Medication Refill    HPI Corey Lawrence presents for COPD follow-up.  Note patient was just in about 10 days ago for COPD exacerbation received a course of azithromycin and prednisone and is improved.  He is still smoking about 1/4 pack a week of cigarettes.  On arrival blood pressure 134/57.  He does agree to receive the flu vaccine.  He is due a CT scanning follow-up of his chest for lung nodules. The patient has received his pneumonia vaccine.  Patient is still coughing up green mucus in the morning but it is less.  He is still short of breath with exertion.  He states the triggers are smoking for him are eating and going to bed at night.  Patient has no other complaints.  He is on chronic pain management program.  He does need refills on his inhalers.  He does maintain the Symbicort albuterol and Incruse.  He uses about 2 canisters of albuterol a month.  Past Medical History:  Diagnosis Date   Ankle fracture, left    "casted; no OR"   Chronic lower back pain    COPD (chronic obstructive pulmonary disease) (HCC)    Lung nodules 09/12/2017   Tobacco abuse     Past Surgical History:  Procedure Laterality Date   INCISION AND DRAINAGE OF WOUND Left ~ 1977   "splinter got infected between" digits 2 & 3    Family History  Problem Relation Age of Onset   Esophageal cancer Father    Colon cancer Sister    Cancer - Other Brother    Heart disease Mother    Diabetes Mellitus II Mother    Heart disease Other        multiple maternal family members   Diabetes Mellitus II Other        multiple maternal family members    Social History   Socioeconomic History   Marital status: Divorced    Spouse name: Not on file   Number of children: Not on file   Years of education: Not on file   Highest  education level: Not on file  Occupational History   Not on file  Tobacco Use   Smoking status: Every Day    Packs/day: 0.25    Years: 43.00    Pack years: 10.75    Types: Cigarettes   Smokeless tobacco: Never   Tobacco comments:    currently on patch  Substance and Sexual Activity   Alcohol use: No    Alcohol/week: 0.0 standard drinks    Comment: "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  Not married.  5 children.     Social Determinants of Health   Financial Resource Strain: Not on file  Food Insecurity: Not on file  Transportation Needs: Not on file  Physical Activity: Not on file  Stress: Not on file  Social Connections: Not on file  Intimate Partner Violence: Not on file    Outpatient Medications Prior to Visit  Medication Sig Dispense  Refill   acetaminophen (TYLENOL) 325 MG tablet Take 2 tablets (650 mg total) by mouth every 6 (six) hours as needed for mild pain (or Fever >/= 101).     albuterol (PROVENTIL) (2.5 MG/3ML) 0.083% nebulizer solution USE 1 VIAL VIA NEBULIZER EVERY 6 HOURS AS NEEDED FOR WHEEZING OR SHORTNESS OF BREATH 90 mL 2   aspirin EC 81 MG tablet Take 1 tablet (81 mg total) by mouth daily. 100 tablet 1   diphenhydramine-acetaminophen (TYLENOL PM) 25-500 MG TABS tablet Take 1 tablet by mouth at bedtime as needed.     HYDROcodone-acetaminophen (NORCO) 7.5-325 MG tablet Take 1 tablet by mouth 2 (two) times daily as needed.     Multiple Vitamins-Minerals (MULTIVITAMIN ADULTS 50+) TABS Take 1 tablet by mouth daily.     naloxone (NARCAN) nasal spray 4 mg/0.1 mL 1 spray as directed.     Respiratory Therapy Supplies (FLUTTER) DEVI Use 4 times daily 1 each 0   traZODone (DESYREL) 100 MG tablet Take by mouth.     albuterol (PROAIR HFA) 108  (90 Base) MCG/ACT inhaler Inhale 2 puffs into the lungs every 6 (six) hours as needed for wheezing or shortness of breath. 36 g 2   budesonide-formoterol (SYMBICORT) 160-4.5 MCG/ACT inhaler Inhale 2 puffs into the lungs 2 (two) times daily. 10.2 g 0   umeclidinium bromide (INCRUSE ELLIPTA) 62.5 MCG/INH AEPB Inhale 1 puff into the lungs daily. 90 each 1   predniSONE (DELTASONE) 10 MG tablet Take 4 tablets daily for 5 days then stop 20 tablet 0   No facility-administered medications prior to visit.    No Known Allergies  ROS Review of Systems  Constitutional:  Negative for chills, diaphoresis and fever.  HENT:  Negative for congestion, hearing loss, nosebleeds, sore throat and tinnitus.   Eyes:  Negative for photophobia and redness.  Respiratory:  Positive for cough, chest tightness, shortness of breath and wheezing. Negative for stridor.        Thin green mucus with small amounts daily  Cardiovascular:  Negative for chest pain, palpitations and leg swelling.  Gastrointestinal:  Negative for abdominal pain, blood in stool, constipation, diarrhea, nausea and vomiting.  Endocrine: Negative for polydipsia.  Genitourinary:  Negative for dysuria, flank pain, frequency, hematuria and urgency.  Musculoskeletal:  Negative for back pain, myalgias and neck pain.  Skin:  Negative for rash.  Allergic/Immunologic: Negative for environmental allergies.  Neurological:  Negative for dizziness, tremors, seizures, weakness and headaches.  Hematological:  Does not bruise/bleed easily.  Psychiatric/Behavioral:  Negative for suicidal ideas. The patient is not nervous/anxious.      Objective:    Physical Exam Vitals reviewed.  Constitutional:      Appearance: Normal appearance. He is well-developed. He is not diaphoretic.     Comments: Thin  HENT:     Head: Normocephalic and atraumatic.     Nose: No nasal deformity, septal deviation, mucosal edema or rhinorrhea.     Right Sinus: No maxillary sinus  tenderness or frontal sinus tenderness.     Left Sinus: No maxillary sinus tenderness or frontal sinus tenderness.     Mouth/Throat:     Mouth: Mucous membranes are moist.     Pharynx: Oropharynx is clear. No oropharyngeal exudate.  Eyes:     General: No scleral icterus.    Conjunctiva/sclera: Conjunctivae normal.     Pupils: Pupils are equal, round, and reactive to light.  Neck:     Thyroid: No thyromegaly.     Vascular: No carotid  bruit or JVD.     Trachea: Trachea normal. No tracheal tenderness or tracheal deviation.  Cardiovascular:     Rate and Rhythm: Normal rate and regular rhythm.     Chest Wall: PMI is not displaced.     Pulses: Normal pulses. No decreased pulses.     Heart sounds: Normal heart sounds, S1 normal and S2 normal. Heart sounds not distant. No murmur heard. No systolic murmur is present.  No diastolic murmur is present.    No friction rub. No gallop. No S3 or S4 sounds.  Pulmonary:     Effort: Pulmonary effort is normal. No tachypnea, accessory muscle usage or respiratory distress.     Breath sounds: No stridor. Wheezing and rhonchi present. No decreased breath sounds or rales.  Chest:     Chest wall: No tenderness.  Abdominal:     General: Bowel sounds are normal. There is no distension.     Palpations: Abdomen is soft. Abdomen is not rigid.     Tenderness: There is no abdominal tenderness. There is no guarding or rebound.  Musculoskeletal:        General: Normal range of motion.     Cervical back: Normal range of motion and neck supple. No edema, erythema or rigidity. No muscular tenderness. Normal range of motion.  Lymphadenopathy:     Head:     Right side of head: No submental or submandibular adenopathy.     Left side of head: No submental or submandibular adenopathy.     Cervical: No cervical adenopathy.  Skin:    General: Skin is warm and dry.     Coloration: Skin is not pale.     Findings: No rash.     Nails: There is no clubbing.  Neurological:      Mental Status: He is alert and oriented to person, place, and time.     Sensory: No sensory deficit.  Psychiatric:        Mood and Affect: Mood normal.        Speech: Speech normal.        Behavior: Behavior normal.        Thought Content: Thought content normal.        Judgment: Judgment normal.    BP 134/67   Pulse 62   Resp 16   Wt 126 lb 12.8 oz (57.5 kg)   SpO2 99%   BMI 23.19 kg/m  Wt Readings from Last 3 Encounters:  11/27/20 126 lb 12.8 oz (57.5 kg)  07/10/20 124 lb 12.8 oz (56.6 kg)  01/06/20 126 lb 9.6 oz (57.4 kg)     Health Maintenance Due  Topic Date Due   COLONOSCOPY (Pts 45-64yrs Insurance coverage will need to be confirmed)  Never done   Zoster Vaccines- Shingrix (1 of 2) Never done   COVID-19 Vaccine (3 - Booster for Pfizer series) 12/22/2019    There are no preventive care reminders to display for this patient.  Lab Results  Component Value Date   TSH 0.227 (L) 07/22/2017   Lab Results  Component Value Date   WBC 3.1 (L) 08/27/2018   HGB 14.3 08/27/2018   HCT 41.2 08/27/2018   MCV 92.8 08/27/2018   PLT 193 08/27/2018   Lab Results  Component Value Date   NA 140 08/27/2018   K 4.0 08/27/2018   CO2 27 08/27/2018   GLUCOSE 138 (H) 08/27/2018   BUN 21 08/27/2018   CREATININE 0.99 08/27/2018   BILITOT 0.6 08/27/2018  ALKPHOS 114 07/22/2017   AST 103 (H) 08/27/2018   ALT 99 (H) 08/27/2018   PROT 7.8 08/27/2018   ALBUMIN 4.3 07/22/2017   CALCIUM 9.5 08/27/2018   ANIONGAP 11 04/04/2017   Lab Results  Component Value Date   CHOL 153 07/22/2017   Lab Results  Component Value Date   HDL 37 (L) 07/22/2017   Lab Results  Component Value Date   LDLCALC 101 (H) 07/22/2017   Lab Results  Component Value Date   TRIG 76 07/22/2017   Lab Results  Component Value Date   CHOLHDL 4.1 07/22/2017   Lab Results  Component Value Date   HGBA1C 5.5 12/28/2019      Assessment & Plan:   Problem List Items Addressed This Visit        Respiratory   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(HCC)    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stable at this time recent exacerbation  I spent considerable amount of time going over smoking cessation I am not altogether convinced this patient will completely quit smoking despite our efforts  Refills on Symbicort and Incruse sent to the pharmacy along with albuterol      Relevant Medications   albuterol (PROAIR HFA) 108 (90 Base) MCG/ACT inhaler   budesonide-formoterol (SYMBICORT) 160-4.5 MCG/ACT inhaler   umeclidinium bromide (INCRUSE ELLIPTA) 62.5 MCG/ACT AEPB   Centrilobular emphysema (HCC)    As per COPD assessment      Relevant Medications   albuterol (PROAIR HFA) 108 (90 Base) MCG/ACT inhaler   budesonide-formoterol (SYMBICORT) 160-4.5 MCG/ACT inhaler   umeclidinium bromide (INCRUSE ELLIPTA) 62.5 MCG/ACT AEPB     Other   Multiple lung nodules on CT    Multiple lung nodules on CT not image since December 2020  Repeat low-dose CT imaging to follow-up lung nodules particular since the patient has not quit smoking      Relevant Orders   CT CHEST NODULE FOLLOW UP LOW DOSE W/O   Tobacco dependence - Primary       Current smoking consumption amount: 1 pack a week  Dicsussion on advise to quit smoking and smoking impacts: Behavioral modification medication treatments  Patient's willingness to quit: a lot of stress in his life a barrier to quit  Methods to quit smoking discussed: He has failed nicotine replacement and Chantix  Medication management of smoking session drugs discussed: No other pharmacologic approach is available  Resources provided:  AVS   Setting quit date not established  Follow-up arranged 4 months   Time spent counseling the patient:         Relevant Orders   CT CHEST NODULE FOLLOW UP LOW DOSE W/O   Other Visit Diagnoses     Need for immunization against influenza       Relevant Orders   Flu Vaccine QUAD 69mo+IM (Fluarix, Fluzone & Alfiuria  Quad PF) (Completed)       Meds ordered this encounter  Medications   albuterol (PROAIR HFA) 108 (90 Base) MCG/ACT inhaler    Sig: Inhale 2 puffs into the lungs every 6 (six) hours as needed for wheezing or shortness of breath.    Dispense:  36 g    Refill:  2    Two inhalers per month   budesonide-formoterol (SYMBICORT) 160-4.5 MCG/ACT inhaler    Sig: Inhale 2 puffs into the lungs 2 (two) times daily.    Dispense:  10.2 g    Refill:  4    Must have office visit for refills  umeclidinium bromide (INCRUSE ELLIPTA) 62.5 MCG/ACT AEPB    Sig: Inhale 1 puff into the lungs daily.    Dispense:  30 each    Refill:  4    Follow-up: Return in about 4 months (around 03/30/2021).    Shan Levans, MD

## 2020-11-27 ENCOUNTER — Other Ambulatory Visit: Payer: Self-pay

## 2020-11-27 ENCOUNTER — Encounter: Payer: Self-pay | Admitting: Critical Care Medicine

## 2020-11-27 ENCOUNTER — Ambulatory Visit: Payer: Medicaid Other | Attending: Critical Care Medicine | Admitting: Critical Care Medicine

## 2020-11-27 VITALS — BP 134/67 | HR 62 | Resp 16 | Wt 126.8 lb

## 2020-11-27 DIAGNOSIS — F17200 Nicotine dependence, unspecified, uncomplicated: Secondary | ICD-10-CM

## 2020-11-27 DIAGNOSIS — F17210 Nicotine dependence, cigarettes, uncomplicated: Secondary | ICD-10-CM

## 2020-11-27 DIAGNOSIS — J449 Chronic obstructive pulmonary disease, unspecified: Secondary | ICD-10-CM

## 2020-11-27 DIAGNOSIS — J432 Centrilobular emphysema: Secondary | ICD-10-CM | POA: Diagnosis not present

## 2020-11-27 DIAGNOSIS — Z23 Encounter for immunization: Secondary | ICD-10-CM

## 2020-11-27 DIAGNOSIS — R918 Other nonspecific abnormal finding of lung field: Secondary | ICD-10-CM | POA: Diagnosis not present

## 2020-11-27 DIAGNOSIS — J342 Deviated nasal septum: Secondary | ICD-10-CM | POA: Diagnosis not present

## 2020-11-27 MED ORDER — ALBUTEROL SULFATE HFA 108 (90 BASE) MCG/ACT IN AERS: 2.0000 | INHALATION_SPRAY | Freq: Four times a day (QID) | RESPIRATORY_TRACT | 2 refills | Status: DC | PRN

## 2020-11-27 MED ORDER — UMECLIDINIUM BROMIDE 62.5 MCG/ACT IN AEPB: 1.0000 | INHALATION_SPRAY | Freq: Every day | RESPIRATORY_TRACT | 4 refills | Status: DC

## 2020-11-27 MED ORDER — BUDESONIDE-FORMOTEROL FUMARATE 160-4.5 MCG/ACT IN AERO: 2.0000 | INHALATION_SPRAY | Freq: Two times a day (BID) | RESPIRATORY_TRACT | 4 refills | Status: DC

## 2020-11-27 NOTE — Patient Instructions (Signed)
A CT of the chest will be obtained to evaluate your lung nodules  Focus on smoking cessation as we discussed consider using nicotine patch 21 mg daily  Refills on your inhaler sent to your [facility] pharmacy  Flu vaccine was given  Return to Dr. Delford Field 4 months

## 2020-11-27 NOTE — Assessment & Plan Note (Signed)
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stable at this time recent exacerbation  I spent considerable amount of time going over smoking cessation I am not altogether convinced this patient will completely quit smoking despite our efforts  Refills on Symbicort and Incruse sent to the pharmacy along with albuterol

## 2020-11-27 NOTE — Assessment & Plan Note (Signed)
As per COPD assessment 

## 2020-11-27 NOTE — Assessment & Plan Note (Signed)
  .   Current smoking consumption amount: 1 pack a week  . Dicsussion on advise to quit smoking and smoking impacts: Behavioral modification medication treatments  . Patient's willingness to quit: a lot of stress in his life a barrier to quit  . Methods to quit smoking discussed: He has failed nicotine replacement and Chantix  . Medication management of smoking session drugs discussed: No other pharmacologic approach is available  . Resources provided:  AVS   . Setting quit date not established  . Follow-up arranged 4 months   Time spent counseling the patient:  

## 2020-11-27 NOTE — Assessment & Plan Note (Signed)
Multiple lung nodules on CT not image since December 2020  Repeat low-dose CT imaging to follow-up lung nodules particular since the patient has not quit smoking

## 2021-01-09 ENCOUNTER — Other Ambulatory Visit: Payer: Self-pay | Admitting: Critical Care Medicine

## 2021-01-24 ENCOUNTER — Other Ambulatory Visit: Payer: Self-pay | Admitting: Internal Medicine

## 2021-01-24 ENCOUNTER — Ambulatory Visit: Payer: Self-pay

## 2021-01-24 ENCOUNTER — Telehealth: Payer: Medicaid Other | Admitting: Physician Assistant

## 2021-01-24 DIAGNOSIS — J449 Chronic obstructive pulmonary disease, unspecified: Secondary | ICD-10-CM | POA: Diagnosis not present

## 2021-01-24 MED ORDER — ALBUTEROL SULFATE (2.5 MG/3ML) 0.083% IN NEBU: INHALATION_SOLUTION | RESPIRATORY_TRACT | 4 refills | Status: DC

## 2021-01-24 MED ORDER — PREDNISONE 20 MG PO TABS: 40.0000 mg | ORAL_TABLET | Freq: Every day | ORAL | 0 refills | Status: DC

## 2021-01-24 MED ORDER — DOXYCYCLINE HYCLATE 100 MG PO TABS: 100.0000 mg | ORAL_TABLET | Freq: Two times a day (BID) | ORAL | 0 refills | Status: DC

## 2021-01-24 MED ORDER — BENZONATATE 100 MG PO CAPS
100.0000 mg | ORAL_CAPSULE | Freq: Three times a day (TID) | ORAL | 0 refills | Status: DC | PRN
Start: 2021-01-24 — End: 2022-08-29

## 2021-01-24 NOTE — Progress Notes (Signed)
Virtual Visit Consent   Corey Lawrence, you are scheduled for a virtual visit with a [facility] provider today.     Just as with appointments in the office, your consent must be obtained to participate.  Your consent will be active for this visit and any virtual visit you may have with one of our providers in the next 365 days.     If you have a MyChart account, a copy of this consent can be sent to you electronically.  All virtual visits are billed to your insurance company just like a traditional visit in the office.    As this is a virtual visit, video technology does not allow for your provider to perform a traditional examination.  This may limit your provider's ability to fully assess your condition.  If your provider identifies any concerns that need to be evaluated in person or the need to arrange testing (such as labs, EKG, etc.), we will make arrangements to do so.     Although advances in technology are sophisticated, we cannot ensure that it will always work on either your end or our end.  If the connection with a video visit is poor, the visit may have to be switched to a telephone visit.  With either a video or telephone visit, we are not always able to ensure that we have a secure connection.     I need to obtain your verbal consent now.   Are you willing to proceed with your visit today?    [name] has provided verbal consent on 01/24/2021 for a virtual visit (video or telephone).   Corey Lawrence, [place]   Date: 01/24/2021 11:40 AM   Virtual Visit via Video Note   I, Corey Lawrence, connected with  Corey Lawrence  ([id number], Aug 23, 1955) on 01/24/21 at 11:15 AM EST by a video-enabled telemedicine application and verified that I am speaking with the correct person using two identifiers.  Location: Patient: Virtual Visit Location Patient: Home Provider: Virtual Visit Location Provider: Home Office   I discussed the limitations of evaluation and  management by telemedicine and the availability of in person appointments. The patient expressed understanding and agreed to proceed.    History of Present Illness: Corey Lawrence is a 65 y.o. who identifies as a male who was assigned male at birth, and is being seen today for increase in his chronic COPD symptoms. Patient notes cold symptoms x 3-4 days. Denies fever, chills, aches. Milder and improving but he is out of his albuterol nebulizer machine and noting some increased chest tightness with exertion only. Notes cough is productive of phlegm that was clear but now a darker green. Is using his other maintenance COPD medications as directed. Has taken a COVID test which was negative.   HPI: HPI  Problems:  Patient Active Problem List   Diagnosis Date Noted   Aortic atherosclerosis (HCC) 07/10/2020   Elevated blood-pressure reading, without diagnosis of hypertension 02/23/2019   Coronary arteriosclerosis 01/26/2019   Centrilobular emphysema (HCC) 08/12/2018   Lumbar radiculopathy 08/12/2018   Cervical radiculopathy 08/12/2018   Urinary hesitancy 02/09/2018   Multiple lung nodules on CT 09/12/2017   Tobacco dependence 09/12/2017   Hep C w/o coma, chronic (HCC) 09/12/2017   Generalized anxiety disorder 05/22/2016   Prediabetes 09/29/2013   Chronic low back pain 09/29/2013   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(HCC) 09/21/2013    Allergies: No Known Allergies Medications:  Current Outpatient Medications:  benzonatate (TESSALON) 100 MG capsule, Take 1 capsule (100 mg total) by mouth 3 (three) times daily as needed for cough., Disp: 30 capsule, Rfl: 0   doxycycline (VIBRA-TABS) 100 MG tablet, Take 1 tablet (100 mg total) by mouth 2 (two) times daily., Disp: 14 tablet, Rfl: 0   predniSONE (DELTASONE) 20 MG tablet, Take 2 tablets (40 mg total) by mouth daily with breakfast., Disp: 10 tablet, Rfl: 0   acetaminophen (TYLENOL) 325 MG tablet, Take 2 tablets (650 mg total) by mouth every 6 (six)  hours as needed for mild pain (or Fever >/= 101)., Disp: , Rfl:    albuterol (PROVENTIL) (2.5 MG/3ML) 0.083% nebulizer solution, USE 1 VIAL VIA NEBULIZER EVERY 6 HOURS AS NEEDED FOR WHEEZING OR SHORTNESS OF BREATH, Disp: 90 mL, Rfl: 4   aspirin EC 81 MG tablet, Take 1 tablet (81 mg total) by mouth daily., Disp: 100 tablet, Rfl: 1   budesonide-formoterol (SYMBICORT) 160-4.5 MCG/ACT inhaler, Inhale 2 puffs into the lungs 2 (two) times daily., Disp: 10.2 g, Rfl: 4   diphenhydramine-acetaminophen (TYLENOL PM) 25-500 MG TABS tablet, Take 1 tablet by mouth at bedtime as needed., Disp: , Rfl:    HYDROcodone-acetaminophen (NORCO) 7.5-325 MG tablet, Take 1 tablet by mouth 2 (two) times daily as needed., Disp: , Rfl:    Multiple Vitamins-Minerals (MULTIVITAMIN ADULTS 50+) TABS, Take 1 tablet by mouth daily., Disp: , Rfl:    naloxone (NARCAN) nasal spray 4 mg/0.1 mL, 1 spray as directed., Disp: , Rfl:    Respiratory Therapy Supplies (FLUTTER) DEVI, Use 4 times daily, Disp: 1 each, Rfl: 0   traZODone (DESYREL) 100 MG tablet, Take by mouth., Disp: , Rfl:    umeclidinium bromide (INCRUSE ELLIPTA) 62.5 MCG/ACT AEPB, Inhale 1 puff into the lungs daily., Disp: 30 each, Rfl: 4   VENTOLIN HFA 108 (90 Base) MCG/ACT inhaler, INHALE 2 PUFFS INTO THE LUNGS EVERY 6 HOURS AS NEEDED FOR WHEEZING OR SHORTNESS OF BREATH, Disp: 36 g, Rfl: 2  Observations/Objective: Patient is well-developed, well-nourished in no acute distress.  Resting comfortably at home.  Head is normocephalic, atraumatic.  No labored breathing. Speech is clear and coherent with logical content.  Patient is alert and oriented at baseline.   Assessment and Plan: 1. 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(HCC) - doxycycline (VIBRA-TABS) 100 MG tablet; Take 1 tablet (100 mg total) by mouth 2 (two) times daily.  Dispense: 14 tablet; Refill: 0 - benzonatate (TESSALON) 100 MG capsule; Take 1 capsule (100 mg total) by mouth 3 (three) times daily as needed for cough.   Dispense: 30 capsule; Refill: 0 - predniSONE (DELTASONE) 20 MG tablet; Take 2 tablets (40 mg total) by mouth daily with breakfast.  Dispense: 10 tablet; Refill: 0 - albuterol (PROVENTIL) (2.5 MG/3ML) 0.083% nebulizer solution; USE 1 VIAL VIA NEBULIZER EVERY 6 HOURS AS NEEDED FOR WHEEZING OR SHORTNESS OF BREATH  Dispense: 90 mL; Refill: 4  Mild exacerbation of COPD with change in sputum color. Will have him take home COVID test just to be cautious but lower risk of this as he keeps to himself mostly. He is to let us or PCP know if positive.   Will start prednisone burst x 5 days, Tessalon per orders and course of Doxycycline. Albuterol nebulizer refilled. Supportive measures and OTC medications reviewed with patient. Strict ER precautions reviewed.   Follow Up Instructions: I discussed the assessment and treatment plan with the patient. The patient was provided an opportunity to ask questions and all were answered. The patient  agreed with the plan and demonstrated an understanding of the instructions.  A copy of instructions were sent to the patient via MyChart unless otherwise noted below.   The patient was advised to call back or seek an in-person evaluation if the symptoms worsen or if the condition fails to improve as anticipated.  Time:  I spent 12 minutes with the patient via telehealth technology discussing the above problems/concerns.    Corey Climes, PA-C

## 2021-01-24 NOTE — Telephone Encounter (Signed)
Medication Refill - Medication: Ventolin   Has the patient contacted their pharmacy? Yes.   Pt states that he was not able to pick up when it was first sent in. He is requesting to pick up now. Please advise.  (Agent: If no, request that the patient contact the pharmacy for the refill. If patient does not wish to contact the pharmacy document the reason why and proceed with request.) (Agent: If yes, when and what did the pharmacy advise?)  Preferred Pharmacy (with phone number or street name):  [id number] - [place], [place] - [address] AT [facility] OF [address]  [address] [place] [address]  Phone: [phone] Fax: [phone]  Hours: Not open 24 hours   Has the patient been seen for an appointment in the last year OR does the patient have an upcoming appointment? Yes.    Agent: Please be advised that RX refills may take up to 3 business days. We ask that you follow-up with your pharmacy.

## 2021-01-24 NOTE — Patient Instructions (Signed)
Corey Lawrence, thank you for joining [name], PA-C for today's virtual visit.  While this provider is not your primary care provider (PCP), if your PCP is located in our provider database this encounter information will be shared with them immediately following your visit.  Consent: (Patient) Corey Lawrence provided verbal consent for this virtual visit at the beginning of the encounter.  Current Medications:  Current Outpatient Medications:    benzonatate (TESSALON) 100 MG capsule, Take 1 capsule (100 mg total) by mouth 3 (three) times daily as needed for cough., Disp: 30 capsule, Rfl: 0   doxycycline (VIBRA-TABS) 100 MG tablet, Take 1 tablet (100 mg total) by mouth 2 (two) times daily., Disp: 14 tablet, Rfl: 0   predniSONE (DELTASONE) 20 MG tablet, Take 2 tablets (40 mg total) by mouth daily with breakfast., Disp: 10 tablet, Rfl: 0   acetaminophen (TYLENOL) 325 MG tablet, Take 2 tablets (650 mg total) by mouth every 6 (six) hours as needed for mild pain (or Fever >/= 101)., Disp: , Rfl:    albuterol (PROVENTIL) (2.5 MG/3ML) 0.083% nebulizer solution, USE 1 VIAL VIA NEBULIZER EVERY 6 HOURS AS NEEDED FOR WHEEZING OR SHORTNESS OF BREATH, Disp: 90 mL, Rfl: 4   aspirin EC 81 MG tablet, Take 1 tablet (81 mg total) by mouth daily., Disp: 100 tablet, Rfl: 1   budesonide-formoterol (SYMBICORT) 160-4.5 MCG/ACT inhaler, Inhale 2 puffs into the lungs 2 (two) times daily., Disp: 10.2 g, Rfl: 4   diphenhydramine-acetaminophen (TYLENOL PM) 25-500 MG TABS tablet, Take 1 tablet by mouth at bedtime as needed., Disp: , Rfl:    HYDROcodone-acetaminophen (NORCO) 7.5-325 MG tablet, Take 1 tablet by mouth 2 (two) times daily as needed., Disp: , Rfl:    Multiple Vitamins-Minerals (MULTIVITAMIN ADULTS 50+) TABS, Take 1 tablet by mouth daily., Disp: , Rfl:    naloxone (NARCAN) nasal spray 4 mg/0.1 mL, 1 spray as directed., Disp: , Rfl:    Respiratory Therapy Supplies (FLUTTER) DEVI, Use 4 times daily,  Disp: 1 each, Rfl: 0   traZODone (DESYREL) 100 MG tablet, Take by mouth., Disp: , Rfl:    umeclidinium bromide (INCRUSE ELLIPTA) 62.5 MCG/ACT AEPB, Inhale 1 puff into the lungs daily., Disp: 30 each, Rfl: 4   VENTOLIN HFA 108 (90 Base) MCG/ACT inhaler, INHALE 2 PUFFS INTO THE LUNGS EVERY 6 HOURS AS NEEDED FOR WHEEZING OR SHORTNESS OF BREATH, Disp: 36 g, Rfl: 2   Medications ordered in this encounter:  Meds ordered this encounter  Medications   doxycycline (VIBRA-TABS) 100 MG tablet    Sig: Take 1 tablet (100 mg total) by mouth 2 (two) times daily.    Dispense:  14 tablet    Refill:  0    Order Specific Question:   Supervising Provider    Answer:   MILLER, BRIAN [[id number]]   benzonatate (TESSALON) 100 MG capsule    Sig: Take 1 capsule (100 mg total) by mouth 3 (three) times daily as needed for cough.    Dispense:  30 capsule    Refill:  0    Order Specific Question:   Supervising Provider    Answer:   MILLER, BRIAN [[id number]]   predniSONE (DELTASONE) 20 MG tablet    Sig: Take 2 tablets (40 mg total) by mouth daily with breakfast.    Dispense:  10 tablet    Refill:  0    Order Specific Question:   Supervising Provider    Answer:   Eber Hong [[id number]]  albuterol (PROVENTIL) (2.5 MG/3ML) 0.083% nebulizer solution    Sig: USE 1 VIAL VIA NEBULIZER EVERY 6 HOURS AS NEEDED FOR WHEEZING OR SHORTNESS OF BREATH    Dispense:  90 mL    Refill:  4    Order Specific Question:   Supervising Provider    Answer:   Eber Hong [[id number]]     *If you need refills on other medications prior to your next appointment, please contact your pharmacy*  Follow-Up: Call back or seek an in-person evaluation if the symptoms worsen or if the condition fails to improve as anticipated.  Other Instructions   If you have been instructed to have an in-person evaluation today at a local Urgent Care facility, please use the link below. It will take you to a list of all of our available [facility], including  address, phone number and hours of operation. Please do not delay care.  [facility]  If you or a family member do not have a primary care provider, use the link below to schedule a visit and establish care. When you choose a [facility] primary care physician or advanced practice provider, you gain a long-term partner in health. Find a Primary Care Provider  Learn more about [facility]'s in-office and virtual care options: [facility] - Get Care Now

## 2021-01-24 NOTE — Telephone Encounter (Signed)
Requested Prescriptions  Pending Prescriptions Disp Refills   albuterol (PROVENTIL) (2.5 MG/3ML) 0.083% nebulizer solution 90 mL 4    Sig: USE 1 VIAL VIA NEBULIZER EVERY 6 HOURS AS NEEDED FOR WHEEZING OR SHORTNESS OF BREATH     Pulmonology:  Beta Agonists Failed - 01/24/2021 10:25 AM      Failed - One inhaler should last at least one month. If the patient is requesting refills earlier, contact the patient to check for uncontrolled symptoms.      Passed - Valid encounter within last 12 months    Recent Outpatient Visits          1 month ago 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(HCC)   [facility] Storm Frisk, MD   6 months ago COPD exacerbation [facility])   [place] [facility] Storm Frisk, MD   8 months ago COPD with acute bronchitis [facility])   [place] [facility] Storm Frisk, MD   1 year ago COPD with acute bronchitis [facility])   [place] [facility] Storm Frisk, MD   1 year ago Hyperglycemia   [facility] [place], Valetta Mole, MD      Future Appointments            In 4 weeks Storm Frisk, MD [facility]

## 2021-01-24 NOTE — Telephone Encounter (Signed)
Chief Complaint: Wheezing, SOB, chest congestion (wanting prednisone and antibiotic) Symptoms: SOB with exertion, wheezing x 3-4 days Frequency: Off and on with exertion, relieved with inhaler/nebulizer Pertinent Negatives: Patient denies fever, CP, other symptoms Disposition: [] ED /[] Urgent Care (no appt availability in office) / [] Appointment(In office/virtual)/ [x]  [facility]/ [] Home Care/ [] Refused Recommended Disposition  Additional Notes: No office availability.  Reason for Disposition  [1] MILD difficulty breathing (e.g., minimal/no SOB at rest, SOB with walking, pulse <100) AND [2] NEW-onset or WORSE than normal  Answer Assessment - Initial Assessment Questions 1. RESPIRATORY STATUS: "Describe your breathing?" (e.g., wheezing, shortness of breath, unable to speak, severe coughing)      Wheezing, SOB 2. ONSET: "When did this breathing problem begin?"      4-5 days ago 3. PATTERN "Does the difficult breathing come and go, or has it been constant since it started?"      With exertion 4. SEVERITY: "How bad is your breathing?" (e.g., mild, moderate, severe)    - MILD: No SOB at rest, mild SOB with walking, speaks normally in sentences, can lie down, no retractions, pulse < 100.    - MODERATE: SOB at rest, SOB with minimal exertion and prefers to sit, cannot lie down flat, speaks in phrases, mild retractions, audible wheezing, pulse 100-120.    - SEVERE: Very SOB at rest, speaks in single words, struggling to breathe, sitting hunched forward, retractions, pulse > 120      Moderate 5. RECURRENT SYMPTOM: "Have you had difficulty breathing before?" If Yes, ask: "When was the last time?" and "What happened that time?"      Yes 6. CARDIAC HISTORY: "Do you have any history of heart disease?" (e.g., heart attack, angina, bypass surgery, angioplasty)      N/A 7. LUNG HISTORY: "Do you have any history of lung disease?"  (e.g., pulmonary embolus, asthma, emphysema)     Yes COPD 8.  CAUSE: "What do you think is causing the breathing problem?"      N/A 9. OTHER SYMPTOMS: "Do you have any other symptoms? (e.g., dizziness, runny nose, cough, chest pain, fever)     Cough 10. O2 SATURATION MONITOR:  "Do you use an oxygen saturation monitor (pulse oximeter) at home?" If Yes, "What is your reading (oxygen level) today?" "What is your usual oxygen saturation reading?" (e.g., 95%)       N/A 11. PREGNANCY: "Is there any chance you are pregnant?" "When was your last menstrual period?"       N/A 12. TRAVEL: "Have you traveled out of the country in the last month?" (e.g., travel history, exposures)       N/A  Protocols used: Breathing Difficulty-A-AH

## 2021-02-22 ENCOUNTER — Telehealth: Payer: Self-pay | Admitting: Critical Care Medicine

## 2021-03-16 ENCOUNTER — Telehealth: Payer: Self-pay | Admitting: Internal Medicine

## 2021-03-16 ENCOUNTER — Other Ambulatory Visit: Payer: Self-pay | Admitting: Internal Medicine

## 2021-03-16 DIAGNOSIS — J449 Chronic obstructive pulmonary disease, unspecified: Secondary | ICD-10-CM

## 2021-03-16 MED ORDER — ALBUTEROL SULFATE (2.5 MG/3ML) 0.083% IN NEBU: INHALATION_SOLUTION | RESPIRATORY_TRACT | 1 refills | Status: DC

## 2021-03-16 MED ORDER — BUDESONIDE-FORMOTEROL FUMARATE 160-4.5 MCG/ACT IN AERO: 2.0000 | INHALATION_SPRAY | Freq: Two times a day (BID) | RESPIRATORY_TRACT | 1 refills | Status: DC

## 2021-03-16 MED ORDER — ALBUTEROL SULFATE HFA 108 (90 BASE) MCG/ACT IN AERS: 2.0000 | INHALATION_SPRAY | Freq: Four times a day (QID) | RESPIRATORY_TRACT | 1 refills | Status: DC | PRN

## 2021-03-16 NOTE — Telephone Encounter (Signed)
Medication Refill - Medication: budesonide-formoterol (SYMBICORT) 160-4.5 MCG/ACT inhaler   albuterol (PROVENTIL) (2.5 MG/3ML) 0.083% nebulizer solution   VENTOLIN HFA 108 (90 Base) MCG/ACT inhaler Has the patient contacted their pharmacy? Yes.   (Agent: If no, request that the patient contact the pharmacy for the refill. If patient does not wish to contact the pharmacy document the reason why and proceed with request.) (Agent: If yes, when and what did the pharmacy advise?) pharmacy advised pt to call office   Preferred Pharmacy (with phone number or street name): [id number] - [place], [address]  [place], [place] [address]  Phone:  [phone]  Fax:  [phone] Has the patient been seen for an appointment in the last year OR does the patient have an upcoming appointment? Yes.    Agent: Please be advised that RX refills may take up to 3 business days. We ask that you follow-up with your pharmacy.

## 2021-03-16 NOTE — Telephone Encounter (Signed)
Requested Prescriptions  Pending Prescriptions Disp Refills   budesonide-formoterol (SYMBICORT) 160-4.5 MCG/ACT inhaler 10.2 g 1    Sig: Inhale 2 puffs into the lungs 2 (two) times daily.     Pulmonology:  Combination Products Passed - 03/16/2021  6:25 PM      Passed - Valid encounter within last 12 months    Recent Outpatient Visits          3 months ago 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([place])   [facility] Elsie Stain, MD   8 months ago COPD exacerbation [facility])   [place] [facility] Elsie Stain, MD   10 months ago COPD with acute bronchitis [facility])   [place] [facility] Elsie Stain, MD   1 year ago COPD with acute bronchitis [facility])   [place] [facility] Elsie Stain, MD   1 year ago Hyperglycemia   [place], Darrick Penna, MD      Future Appointments            In 3 weeks Elsie Stain, MD [place]            albuterol (PROVENTIL) (2.5 MG/3ML) 0.083% nebulizer solution 90 mL 1    Sig: USE 1 VIAL VIA NEBULIZER EVERY 6 HOURS AS NEEDED FOR WHEEZING OR SHORTNESS OF BREATH     Pulmonology:  Beta Agonists 2 Passed - 03/16/2021  6:25 PM      Passed - Last BP in normal range    BP Readings from Last 1 Encounters:  11/27/20 134/67         Passed - Last Heart Rate in normal range    Pulse Readings from Last 1 Encounters:  11/27/20 62         Passed - Valid encounter within last 12 months    Recent Outpatient Visits          3 months ago 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([place])   [place] Elsie Stain, MD   8 months ago COPD exacerbation [facility])   [place], Patrick E, MD   10 months ago COPD with acute bronchitis [facility])   [place] [facility] Elsie Stain, MD   1 year ago COPD with acute bronchitis  [facility])   [place] Elsie Stain, MD   1 year ago Hyperglycemia   [place], MD      Future Appointments            In 3 weeks Elsie Stain, MD [place]            albuterol (VENTOLIN HFA) 108 (90 Base) MCG/ACT inhaler 36 g 1    Sig: Inhale 2 puffs into the lungs every 6 (six) hours as needed for wheezing or shortness of breath.     Pulmonology:  Beta Agonists 2 Passed - 03/16/2021  6:25 PM      Passed - Last BP in normal range    BP Readings from Last 1 Encounters:  11/27/20 134/67         Passed - Last Heart Rate in normal range    Pulse Readings from Last 1 Encounters:  11/27/20 62         Passed -  Valid encounter within last 12 months    Recent Outpatient Visits          3 months ago 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([place])   [place] Elsie Stain, MD   8 months ago COPD exacerbation [facility])   [place], Patrick E, MD   10 months ago COPD with acute bronchitis [facility])   [place] [facility] Elsie Stain, MD   1 year ago COPD with acute bronchitis [facility])   [place] Elsie Stain, MD   1 year ago Hyperglycemia   [place], Darrick Penna, MD      Future Appointments            In 3 weeks Elsie Stain, MD [place]

## 2021-03-17 NOTE — Telephone Encounter (Signed)
Medication refilled 03/16/2021 10.2 with 1 refill. Requested Prescriptions  Pending Prescriptions Disp Refills   budesonide-formoterol (SYMBICORT) 160-4.5 MCG/ACT inhaler [Pharmacy Med Name: BUDESONIDE/FORM 160/4.5MCG(120 INH)] 30.6 g     Sig: INHALE 2 PUFFS INTO THE LUNGS TWICE DAILY     Pulmonology:  Combination Products Passed - 03/16/2021  7:55 PM      Passed - Valid encounter within last 12 months    Recent Outpatient Visits          3 months ago 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([place])   [place] Elsie Stain, MD   8 months ago COPD exacerbation [facility])   [place], Patrick E, MD   10 months ago COPD with acute bronchitis [facility])   [place] [facility] Elsie Stain, MD   1 year ago COPD with acute bronchitis [facility])   [place] Elsie Stain, MD   1 year ago Hyperglycemia   [place], Darrick Penna, MD      Future Appointments            In 3 weeks Elsie Stain, MD [place]

## 2021-03-22 ENCOUNTER — Ambulatory Visit: Payer: Self-pay | Admitting: *Deleted

## 2021-03-22 ENCOUNTER — Telehealth: Payer: Self-pay | Admitting: Internal Medicine

## 2021-03-22 DIAGNOSIS — J449 Chronic obstructive pulmonary disease, unspecified: Secondary | ICD-10-CM

## 2021-03-22 MED ORDER — ALBUTEROL SULFATE HFA 108 (90 BASE) MCG/ACT IN AERS: 2.0000 | INHALATION_SPRAY | Freq: Four times a day (QID) | RESPIRATORY_TRACT | 1 refills | Status: DC | PRN

## 2021-03-22 MED ORDER — PREDNISONE 20 MG PO TABS: 40.0000 mg | ORAL_TABLET | Freq: Every day | ORAL | 0 refills | Status: DC

## 2021-03-22 NOTE — Telephone Encounter (Signed)
°  Chief Complaint: SOB, cough Symptoms: Cough, greenish phlegm, SOB at rest, wheezing, audible wheezing during call, chest tightness Frequency: 4 days ago Pertinent Negatives: Patient denies fever Disposition: [x] ED /[] Urgent Care (no appt availability in office) / [] Appointment(In office/virtual)/ []  [facility]/ [] Home Care/ [] Refused Recommended Disposition /[] [facility]/ []  Follow-up with PCP Additional Notes: Pt with angry affect. States "I just need albuterol inhaler which my insurance says I can't have unless Dr. sends letter that pro-air doesn't work for me. "I also need an antibiotic and prednisone. Dr. is my lung doctor, I want him to know." Reiterated need for ED eval. Pt continuous cough during call. Declines ED.   Reason for Disposition  [1] MODERATE difficulty breathing (e.g., speaks in phrases, SOB even at rest, pulse 100-120) AND [2] still present when not coughing  Answer Assessment - Initial Assessment Questions 1. ONSET: "When did the cough begin?"      4 days 2. SEVERITY: "How bad is the cough today?"      Severe 3. SPUTUM: "Describe the color of your sputum" (none, dry cough; clear, white, yellow, green)     Greenish 4. HEMOPTYSIS: "Are you coughing up any blood?" If so ask: "How much?" (flecks, streaks, tablespoons, etc.)    no 5. DIFFICULTY BREATHING: "Are you having difficulty breathing?" If Yes, ask: "How bad is it?" (e.g., mild, moderate, severe)    - MILD: No SOB at rest, mild SOB with walking, speaks normally in sentences, can lie down, no retractions, pulse < 100.    - MODERATE: SOB at rest, SOB with minimal exertion and prefers to sit, cannot lie down flat, speaks in phrases, mild retractions, audible wheezing, pulse 100-120.    - SEVERE: Very SOB at rest, speaks in single words, struggling to breathe, sitting hunched forward, retractions, pulse > 120      severe 6. FEVER: "Do you have a fever?" If Yes, ask: "What is your  temperature, how was it measured, and when did it start?"     no 7. CARDIAC HISTORY: "Do you have any history of heart disease?" (e.g., heart attack, congestive heart failure)      *No Answer* 8. LUNG HISTORY: "Do you have any history of lung disease?"  (e.g., pulmonary embolus, asthma, emphysema)     *No Answer* 9. PE RISK FACTORS: "Do you have a history of blood clots?" (or: recent major surgery, recent prolonged travel, bedridden)     *No Answer* 10. OTHER SYMPTOMS: "Do you have any other symptoms?" (e.g., runny nose, wheezing, chest pain)       Wheezing, chest tightness  Protocols used: Cough - Acute Productive-A-AH

## 2021-03-22 NOTE — Telephone Encounter (Signed)
Pt calling b/c his albuterol (VENTOLIN HFA) 108 (90 Base) MCG/ACT inhaler Needs a prior authorization.

## 2021-03-22 NOTE — Telephone Encounter (Signed)
Will forward to provider  

## 2021-03-22 NOTE — Telephone Encounter (Signed)
Copied from CRM [id number]. Topic: General - Other >> Mar 22, 2021  3:57 PM Corey Lawrence wrote: Reason for CRM: The patient has called to provide additional information regarding their albuterol (VENTOLIN HFA) 108 (90 Base) MCG/ACT inhaler [[id number]] prescription   The patient has been told by their insurance company that if the prescription is submitted with instructions/notation that the inhaler is Lawrence "must have and needed prescription" for the patient then their insurance will honor coverage of the medication   Please contact further when possible

## 2021-03-23 ENCOUNTER — Telehealth: Payer: Self-pay

## 2021-03-23 ENCOUNTER — Other Ambulatory Visit: Payer: Self-pay

## 2021-03-23 ENCOUNTER — Emergency Department (HOSPITAL_COMMUNITY): Payer: Medicare Other

## 2021-03-23 ENCOUNTER — Encounter (HOSPITAL_COMMUNITY): Payer: Self-pay

## 2021-03-23 ENCOUNTER — Emergency Department (HOSPITAL_COMMUNITY)
Admission: EM | Admit: 2021-03-23 | Discharge: 2021-03-23 | Disposition: A | Discharge status: Home / Self Care | Payer: Medicare Other | Attending: Emergency Medicine | Admitting: Emergency Medicine

## 2021-03-23 DIAGNOSIS — Z20822 Contact with and (suspected) exposure to covid-19: Secondary | ICD-10-CM | POA: Insufficient documentation

## 2021-03-23 DIAGNOSIS — Z7951 Long term (current) use of inhaled steroids: Secondary | ICD-10-CM | POA: Insufficient documentation

## 2021-03-23 DIAGNOSIS — R0602 Shortness of breath: Secondary | ICD-10-CM | POA: Diagnosis not present

## 2021-03-23 DIAGNOSIS — Z7982 Long term (current) use of aspirin: Secondary | ICD-10-CM | POA: Insufficient documentation

## 2021-03-23 DIAGNOSIS — J449 Chronic obstructive pulmonary disease, unspecified: Secondary | ICD-10-CM | POA: Insufficient documentation

## 2021-03-23 DIAGNOSIS — R062 Wheezing: Secondary | ICD-10-CM | POA: Insufficient documentation

## 2021-03-23 DIAGNOSIS — R059 Cough, unspecified: Secondary | ICD-10-CM | POA: Insufficient documentation

## 2021-03-23 DIAGNOSIS — F17200 Nicotine dependence, unspecified, uncomplicated: Secondary | ICD-10-CM | POA: Insufficient documentation

## 2021-03-23 DIAGNOSIS — R0609 Other forms of dyspnea: Secondary | ICD-10-CM | POA: Insufficient documentation

## 2021-03-23 DIAGNOSIS — J439 Emphysema, unspecified: Secondary | ICD-10-CM | POA: Diagnosis not present

## 2021-03-23 LAB — CBC WITH DIFFERENTIAL/PLATELET
Abs Immature Granulocytes: 0.04 10*3/uL (ref 0.00–0.07)
Basophils Absolute: 0.1 10*3/uL (ref 0.0–0.1)
Basophils Relative: 1 %
Eosinophils Absolute: 0.1 10*3/uL (ref 0.0–0.5)
Eosinophils Relative: 1 %
HCT: 47.4 % (ref 39.0–52.0)
Hemoglobin: 16 g/dL (ref 13.0–17.0)
Immature Granulocytes: 0 %
Lymphocytes Relative: 10 %
Lymphs Abs: 1.1 10*3/uL (ref 0.7–4.0)
MCH: 31.1 pg (ref 26.0–34.0)
MCHC: 33.8 g/dL (ref 30.0–36.0)
MCV: 92 fL (ref 80.0–100.0)
Monocytes Absolute: 0.2 10*3/uL (ref 0.1–1.0)
Monocytes Relative: 2 %
Neutro Abs: 10.3 10*3/uL — ABNORMAL HIGH (ref 1.7–7.7)
Neutrophils Relative %: 86 %
Platelets: 265 10*3/uL (ref 150–400)
RBC: 5.15 MIL/uL (ref 4.22–5.81)
RDW: 12.9 % (ref 11.5–15.5)
WBC: 11.9 10*3/uL — ABNORMAL HIGH (ref 4.0–10.5)
nRBC: 0 % (ref 0.0–0.2)

## 2021-03-23 LAB — COMPREHENSIVE METABOLIC PANEL
ALT: 10 U/L (ref 0–44)
AST: 21 U/L (ref 15–41)
Albumin: 4.1 g/dL (ref 3.5–5.0)
Alkaline Phosphatase: 64 U/L (ref 38–126)
Anion gap: 13 (ref 5–15)
BUN: 9 mg/dL (ref 8–23)
CO2: 26 mmol/L (ref 22–32)
Calcium: 9.3 mg/dL (ref 8.9–10.3)
Chloride: 98 mmol/L (ref 98–111)
Creatinine, Ser: 0.79 mg/dL (ref 0.61–1.24)
GFR, Estimated: >60 mL/min (ref >60)
Glucose, Bld: 127 mg/dL — ABNORMAL HIGH (ref 70–99)
Potassium: 3.9 mmol/L (ref 3.5–5.1)
Sodium: 137 mmol/L (ref 135–145)
Total Bilirubin: 0.4 mg/dL (ref 0.3–1.2)
Total Protein: 7.8 g/dL (ref 6.5–8.1)

## 2021-03-23 LAB — TROPONIN I (HIGH SENSITIVITY)
Troponin I (High Sensitivity): 9 ng/L (ref <18)
Troponin I (High Sensitivity): 6 ng/L (ref <18)

## 2021-03-23 LAB — RESP PANEL BY RT-PCR (FLU A&B, COVID) ARPGX2
Influenza A by PCR: NEGATIVE
Influenza B by PCR: NEGATIVE
SARS Coronavirus 2 by RT PCR: NEGATIVE

## 2021-03-23 MED ORDER — ALBUTEROL SULFATE (2.5 MG/3ML) 0.083% IN NEBU: INHALATION_SOLUTION | RESPIRATORY_TRACT | 1 refills | Status: DC

## 2021-03-23 MED ORDER — AZITHROMYCIN 250 MG PO TABS: 250.0000 mg | ORAL_TABLET | Freq: Every day | ORAL | 0 refills | Status: AC

## 2021-03-23 MED ORDER — ALBUTEROL SULFATE HFA 108 (90 BASE) MCG/ACT IN AERS: 2.0000 | INHALATION_SPRAY | Freq: Four times a day (QID) | RESPIRATORY_TRACT | 2 refills | Status: DC | PRN

## 2021-03-23 MED ORDER — ALBUTEROL SULFATE 108 (90 BASE) MCG/ACT IN AEPB
2.0000 | INHALATION_SPRAY | Freq: Four times a day (QID) | RESPIRATORY_TRACT | 3 refills | Status: DC | PRN
  Filled 2021-03-23: qty 8.5, 213d supply, fill #0

## 2021-03-23 MED ORDER — IPRATROPIUM-ALBUTEROL 0.5-2.5 (3) MG/3ML IN SOLN
3.0000 mL | Freq: Once | RESPIRATORY_TRACT | Status: AC
Start: 2021-03-23 — End: 2021-03-23
  Administered 2021-03-23: 3 mL via RESPIRATORY_TRACT
  Filled 2021-03-23: qty 3

## 2021-03-23 MED ORDER — SODIUM CHLORIDE 0.9 % IV SOLN: 1.0000 g | Freq: Once | INTRAVENOUS | Status: DC

## 2021-03-23 MED ORDER — SODIUM CHLORIDE 0.9 % IV SOLN
500.0000 mg | Freq: Once | INTRAVENOUS | Status: AC
  Administered 2021-03-23: 500 mg via INTRAVENOUS
  Filled 2021-03-23: qty 5

## 2021-03-23 MED ORDER — METHYLPREDNISOLONE SODIUM SUCC 125 MG IJ SOLR
125.0000 mg | Freq: Once | INTRAMUSCULAR | Status: AC
  Administered 2021-03-23: 125 mg via INTRAVENOUS
  Filled 2021-03-23: qty 2

## 2021-03-23 MED ORDER — PREDNISONE 10 MG PO TABS: 40.0000 mg | ORAL_TABLET | Freq: Every day | ORAL | 0 refills | Status: AC

## 2021-03-23 MED ORDER — ALBUTEROL SULFATE HFA 108 (90 BASE) MCG/ACT IN AERS
2.0000 | INHALATION_SPRAY | Freq: Once | RESPIRATORY_TRACT | Status: AC
  Administered 2021-03-23: 2 via RESPIRATORY_TRACT
  Filled 2021-03-23: qty 6.7

## 2021-03-23 NOTE — Telephone Encounter (Signed)
Copied from [place]. Topic: General - Other >> Mar 23, 2021 12:47 PM Corey Lawrence wrote: Reason for CRM: Pt called to notify Dr. Joya Gaskins that he is on his way to the ED at [facility]

## 2021-03-23 NOTE — Discharge Instructions (Signed)
I have sent a refill for your nebulizer and albuterol inhaler to your pharmacy.  I have also given you a course of antibiotics and a prednisone course.  Please return if your symptoms start worsening.

## 2021-03-23 NOTE — ED Provider Triage Note (Signed)
Emergency Medicine Provider Triage Evaluation Note  RAWLINS STUARD , a 66 y.o. male  was evaluated in triage.  Pt complains of shortness of breath. States that same began on 2/2. Hx of COPD, has been able to manage at home with prescribed inhalers, but states that yesterday he ran out. Denies any fevers or chills. States that he has been coughing a significant amount. Endorses some pressure in his chest associated with coughing, no pain. No hx of cardiac problems  Review of Systems  Positive: Cough, shortness of breath Negative: Fevers, chills  Physical Exam  BP 139/86    Pulse (!) 101    Temp (!) 97.1 F (36.2 C) (Oral)    Resp (!) 22    SpO2 97%  Gen:   Awake, no distress  Resp:  Patient tachypneic and tachycardic with audible wheezing, satting 97% on room air MSK:   Moves extremities without difficulty  Other:    Medical Decision Making  Medically screening exam initiated at 2:11 PM.  Appropriate orders placed.  MAKENZIE VITTORIO was informed that the remainder of the evaluation will be completed by another provider, this initial triage assessment does not replace that evaluation, and the importance of remaining in the ED until their evaluation is complete.     Silva Bandy, PA-C 03/23/21 1420

## 2021-03-23 NOTE — ED Provider Notes (Signed)
[facility] EMERGENCY DEPARTMENT Provider Note   CSN: [id number] Arrival date & time: 03/23/21  1347     History  Chief Complaint  Patient presents with   Shortness of Breath    Corey Lawrence is a 66 y.o. male.  Medical history includes COPD, prediabetes, tobacco dependence, hepatitis C.  Patient presents the emergency department with worsening shortness of breath.  He states approximately 10 to 11 days ago he started having URI symptoms.  At that time he developed a cough that was productive.  He has been fighting this off for multiple days and has been increasing his inhaler use during this time.  He says that his cough symptoms have continued and now he has had green sputum.  He states that he ran out of his albuterol inhaler about 3 days ago.  Ever since then, he has had worsening shortness of breath and difficulty moving air.  He feels like he has some discomfort mostly on the left side and pain with deep breaths.  He denies any chest pain, abdominal pain, nausea, vomiting, back pain, numbness, weakness.  He denies any fevers or chills.   Shortness of Breath Associated symptoms: cough and wheezing       Home Medications Prior to Admission medications   Medication Sig Start Date End Date Taking? Authorizing Provider  azithromycin (ZITHROMAX) 250 MG tablet Take 1 tablet (250 mg total) by mouth daily for 5 days. Take first 2 tablets together, then 1 every day until finished. 03/23/21 03/28/21 Yes Shawanna Zanders, Finis Bud, PA-C  predniSONE (DELTASONE) 10 MG tablet Take 4 tablets (40 mg total) by mouth daily for 6 days. 03/23/21 03/29/21 Yes Myrtie Leuthold, Finis Bud, PA-C  acetaminophen (TYLENOL) 325 MG tablet Take 2 tablets (650 mg total) by mouth every 6 (six) hours as needed for mild pain (or Fever >/= 101). 09/23/13   Christiane Ha, MD  albuterol (PROAIR HFA) 108 (90 Base) MCG/ACT inhaler Inhale 2 puffs into the lungs every 6 (six) hours as needed for wheezing or shortness of  breath. This is necessary and a must have for patients health 03/23/21   Tristina Sahagian, Delorise Shiner C, PA-C  albuterol (PROVENTIL) (2.5 MG/3ML) 0.083% nebulizer solution USE 1 VIAL VIA NEBULIZER EVERY 6 HOURS AS NEEDED FOR WHEEZING OR SHORTNESS OF BREATH 03/23/21   Shastina Rua, Finis Bud, PA-C  aspirin EC 81 MG tablet Take 1 tablet (81 mg total) by mouth daily. 07/23/18   Marcine Matar, MD  benzonatate (TESSALON) 100 MG capsule Take 1 capsule (100 mg total) by mouth 3 (three) times daily as needed for cough. 01/24/21   Waldon Merl, PA-C  budesonide-formoterol [facility]) 160-4.5 MCG/ACT inhaler Inhale 2 puffs into the lungs 2 (two) times daily. 03/16/21 06/14/21  Marcine Matar, MD  diphenhydramine-acetaminophen (TYLENOL PM) 25-500 MG TABS tablet Take 1 tablet by mouth at bedtime as needed.    [provider]  doxycycline (VIBRA-TABS) 100 MG tablet Take 1 tablet (100 mg total) by mouth 2 (two) times daily. 01/24/21   Waldon Merl, PA-C  HYDROcodone-acetaminophen (NORCO) 7.5-325 MG tablet Take 1 tablet by mouth 2 (two) times daily as needed. 09/08/20   [provider]  Multiple Vitamins-Minerals (MULTIVITAMIN ADULTS 50+) TABS Take 1 tablet by mouth daily.    [provider]  naloxone [facility]) nasal spray 4 mg/0.1 mL 1 spray as directed. 09/21/20   [provider]  Respiratory Therapy Supplies (FLUTTER) DEVI Use 4 times daily 02/17/18   Storm Frisk,  MD  traZODone (DESYREL) 100 MG tablet Take by mouth.    [provider]  umeclidinium bromide (INCRUSE ELLIPTA) 62.5 MCG/ACT AEPB Inhale 1 puff into the lungs daily. 11/27/20   Storm Frisk, MD      Allergies    Patient has no known allergies.    Review of Systems   Review of Systems  Respiratory:  Positive for cough, shortness of breath and wheezing.   All other systems reviewed and are negative.  Physical Exam Updated Vital Signs BP 109/81    Pulse 95    Temp 98.5 F (36.9 C) (Oral)    Resp 17     Ht 5\' 2"  (1.575 m)    Wt 54 kg    SpO2 92%    BMI 21.77 kg/m  Physical Exam Vitals and nursing note reviewed.  Constitutional:      General: He is not in acute distress.    Appearance: Normal appearance. He is ill-appearing. He is not toxic-appearing or diaphoretic.     Comments: Chronically ill appearing  HENT:     Head: Normocephalic and atraumatic.     Nose: No nasal deformity.     Mouth/Throat:     Lips: Pink. No lesions.     Mouth: Mucous membranes are moist. No injury, lacerations, oral lesions or angioedema.     Pharynx: Oropharynx is clear. Uvula midline. No pharyngeal swelling, oropharyngeal exudate, posterior oropharyngeal erythema or uvula swelling.  Eyes:     General: Gaze aligned appropriately. No scleral icterus.       Right eye: No discharge.        Left eye: No discharge.     Conjunctiva/sclera: Conjunctivae normal.     Right eye: Right conjunctiva is not injected. No exudate or hemorrhage.    Left eye: Left conjunctiva is not injected. No exudate or hemorrhage. Neck:     Vascular: No JVD.     Trachea: No tracheal deviation.  Cardiovascular:     Rate and Rhythm: Normal rate and regular rhythm.     Pulses: Normal pulses.          Radial pulses are 2+ on the right side and 2+ on the left side.       Dorsalis pedis pulses are 2+ on the right side and 2+ on the left side.     Heart sounds: Normal heart sounds, S1 normal and S2 normal. Heart sounds not distant. No murmur heard.   No friction rub. No gallop. No S3 or S4 sounds.  Pulmonary:     Effort: Pulmonary effort is normal. No tachypnea, accessory muscle usage or respiratory distress.     Breath sounds: No stridor. Examination of the right-upper field reveals wheezing. Examination of the left-upper field reveals wheezing and rhonchi. Examination of the right-middle field reveals wheezing. Examination of the left-middle field reveals wheezing and rhonchi. Examination of the right-lower field reveals wheezing.  Examination of the left-lower field reveals wheezing. Wheezing and rhonchi present. No rales.  Chest:     Chest wall: No tenderness.  Abdominal:     General: Abdomen is flat. Bowel sounds are normal. There is no distension.     Palpations: Abdomen is soft. There is no mass or pulsatile mass.     Tenderness: There is no abdominal tenderness. There is no guarding or rebound.  Musculoskeletal:     Right lower leg: No edema.     Left lower leg: No edema.  Skin:    General: Skin is  warm and dry.     Coloration: Skin is not jaundiced or pale.     Findings: No bruising, erythema, lesion or rash.  Neurological:     General: No focal deficit present.     Mental Status: He is alert and oriented to person, place, and time.     GCS: GCS eye subscore is 4. GCS verbal subscore is 5. GCS motor subscore is 6.  Psychiatric:        Mood and Affect: Mood normal.        Behavior: Behavior normal. Behavior is cooperative.    ED Results / Procedures / Treatments   Labs (all labs ordered are listed, but only abnormal results are displayed) Labs Reviewed  CBC WITH DIFFERENTIAL/PLATELET - Abnormal; Notable for the following components:      Result Value   WBC 11.9 (*)    Neutro Abs 10.3 (*)    All other components within normal limits  COMPREHENSIVE METABOLIC PANEL - Abnormal; Notable for the following components:   Glucose, Bld 127 (*)    All other components within normal limits  RESP PANEL BY RT-PCR (FLU A&B, COVID) ARPGX2  TROPONIN I (HIGH SENSITIVITY)  TROPONIN I (HIGH SENSITIVITY)    EKG None  Radiology DG Chest 2 View  Result Date: 03/23/2021 CLINICAL DATA:  Shortness of breath EXAM: CHEST - 2 VIEW COMPARISON:  04/04/2017 FINDINGS: The heart size and mediastinal contours are within normal limits. Pulmonary hyperinflation and emphysema. The visualized skeletal structures are unremarkable. IMPRESSION: Pulmonary hyperinflation and emphysema. No acute abnormality of the lungs. Electronically  Signed   By: Jearld LeschAlex D Bibbey M.D.   On: 03/23/2021 14:25    Procedures .Critical Care Performed by: Claudie LeachLoeffler, Treylen Gibbs C, PA-C Authorized by: Claudie LeachLoeffler, Khrista Braun C, PA-C   Critical care provider statement:    Critical care time (minutes):  45   Critical care was necessary to treat or prevent imminent or life-threatening deterioration of the following conditions:  Respiratory failure   Critical care was time spent personally by me on the following activities:  Blood draw for specimens, discussions with consultants, discussions with primary provider, evaluation of patient's response to treatment, examination of patient, obtaining history from patient or surrogate, review of old charts, re-evaluation of patient's condition, pulse oximetry, ordering and review of radiographic studies, ordering and review of laboratory studies and ordering and performing treatments and interventions  Cardiac monitoring while in ED  Medications Ordered in ED Medications  cefTRIAXone (ROCEPHIN) 1 g in sodium chloride 0.9 % 100 mL IVPB (has no administration in time range)  albuterol (VENTOLIN HFA) 108 (90 Base) MCG/ACT inhaler 2 puff (2 puffs Inhalation Given 03/23/21 1354)  ipratropium-albuterol (DUONEB) 0.5-2.5 (3) MG/3ML nebulizer solution 3 mL (3 mLs Nebulization Given 03/23/21 1619)  methylPREDNISolone sodium succinate (SOLU-MEDROL) 125 mg/2 mL injection 125 mg (125 mg Intravenous Given 03/23/21 1708)  azithromycin (ZITHROMAX) 500 mg in sodium chloride 0.9 % 250 mL IVPB (500 mg Intravenous New Bag/Given 03/23/21 1714)    ED Course/ Medical Decision Making/ A&P                           Medical Decision Making Problems Addressed: 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(HCC): chronic illness or injury with exacerbation, progression, or side effects of treatment  Amount and/or Complexity of Data Reviewed External Data Reviewed: labs, radiology, ECG and notes. Labs: ordered. Decision-making details documented in ED  Course. Radiology: ordered and independent interpretation performed. Decision-making details documented in  ED Course. ECG/medicine tests: ordered and independent interpretation performed. Decision-making details documented in ED Course.  Risk Prescription drug management.  This is a 66 y.o. male who presents to the ED with this of breath and wheezing follows worsening cough.  He has a history of COPD and recently ran out of his medications 3 days ago.  Initial Impression: Patient has normal vitals and is hemodynamically stable.  He appears chronically ill.  He is not tachypneic.  He is oxygenating at 95% on room air.  Exam reveals significant wheezing in bilateral lung fields as well as some rhonchi more in the left side.  This is consistent with his previous COPD exasperations.  I concern for development of pneumonia versus COPD exasperation.  I have considered pulmonary embolism, however patient is not tachycardic, has no symptoms of DVT, and has another reasonable explanation for his symptoms.  His Wells score is 0 so do not think we need to do any further testing regarding this.  He is not having any chest pain so doubt ACS, dissection, or other abnormality. Plan to give DuoNeb here and give 125 of Solu-Medrol as well as initial dose of antibiotics to cover for possible pneumonia.  Will reevaluate following treatment.  I personally reviewed and interpreted all laboratory work and agree with radiologist interpretation of imaging. Abnormal results outlined below. Patient has trace leukocytosis to 11.9.  CMP is reassuring.  Troponin is negative x2.  His respiratory panel is negative.  Chest x-ray showed chronic COPD changes but no evidence of pneumonia.  EKG revealed sinus tachycardia with no changes from previous.  There are no ischemic changes on EKG.  Tachycardia resolved without intervention  Impression: Labs are reassuring.  Patient's presentation is consistent with COPD exasperation given being  without prescription for several days.  Patient has consistent symptoms with bronchitis with recent URI as well and worsening cough.  This is likely triggering his exasperation even more.  On reevaluation, patient states that he is feeling well enough to go home and does not want admission at this time.  Patient has remained saturating above 92% on room air.  He is able to ambulate without significant distress.  Lung sounds do sound slightly improved, however I suspect that he has chronic wheezing on his lung exam.  Patient will be discharged with a course of antibiotic given his high risk for pneumonia despite a negative chest x-ray.  We will also provide him a prednisone burst.  We will represcribe inhaler and nebulizer treatments.  Given return precautions if his symptoms worsen.  I have discussed this patient with my attending physician, Dr. Criss AlvineGoldston who has made changes to the plan accordingly.  Portions of this note were generated with Scientist, clinical (histocompatibility and immunogenetics)Dragon dictation software. Dictation errors may occur despite best attempts at proofreading.    Co morbidities that complicate the patient evaluation:  COPD, tobacco dependence   Additional History:  External records from outside source obtained and reviewed including recent PCP notes regarding COPD diagnosis.   Cardiac Monitoring: The patient was maintained on a cardiac monitor.  I personally viewed and interpreted the cardiac monitored which showed an underlying rhythm of: Minimally sinus tach that improved on own without intervention.  He has normal sinus rhythm prior to discharge   Medications:   I ordered medication including DuoNeb, Solu-Medrol, azithromycin, Rocephin for COPD exasperation with presumed community-acquired pneumonia versus chronic bronchitis Reevaluation of the patient after these medicines showed that the patient improved I have reviewed the patients home medicines and  have made adjustments as needed   Critical Interventions: Patient  was with short of this of breath that improved after nebulizer treatments and steroid therapy.  Without intervention, patient would likely decompensate from a respiratory standpoint.   Reevaluation:  After the interventions noted above, I reevaluated the patient and found that they have :improved   Social Determinants of Health: None   Disposition:  After consideration of the diagnostic results and the patients response to treatment, consider admission, however patient feels that his symptoms are improved enough to go home.  He seems to be a reliable patient and knows when/if he needs to return.  He is able to ambulate without significant shortness of breath.  I feel that this would be appropriate with return precautions.  We will provide him with steroid therapy, refill of inhalers, and antibiotic prescription for home  Final Clinical Impression(s) / ED Diagnoses Final diagnoses:  Dyspnea on exertion    Rx / DC Orders ED Discharge Orders          Ordered    albuterol (PROVENTIL) (2.5 MG/3ML) 0.083% nebulizer solution       Note to Pharmacy: This is necessary and a must have for patients health   03/23/21 1848    albuterol [facility] HFA) 108 (90 Base) MCG/ACT inhaler  Every 6 hours PRN        03/23/21 1848    azithromycin (ZITHROMAX) 250 MG tablet  Daily        03/23/21 1848    predniSONE (DELTASONE) 10 MG tablet  Daily        03/23/21 1849              Therese Sarah 03/23/21 1900    Pricilla Loveless, MD 03/23/21 2105

## 2021-03-23 NOTE — Telephone Encounter (Signed)
Ventolin is not covered by [facility] Medicaid, however, generic proair is. Resent rx for generic proair.

## 2021-03-23 NOTE — ED Triage Notes (Signed)
Pt here d/t increased SOB. History of COPD. No supplemental O2 needed . Pt feels as if something is sitting on his chest and can not get air. Pt coloring pale. Unable to clear phlem. Symptoms began approx 10 days ago. Did last breathing treatment this am.

## 2021-03-23 NOTE — Telephone Encounter (Signed)
Generic proair covered by insurance. Insurance continues to deny Ventolin. Rx sent for proair inhaler.

## 2021-03-23 NOTE — Telephone Encounter (Signed)
Luke please refer to Dr. Laural Benes message

## 2021-04-08 NOTE — Progress Notes (Deleted)
? ?Established Patient Office Visit ? ?Subjective:  ?Patient ID: Corey Lawrence, male    DOB: 1956/01/25  Age: 66 y.o. MRN: [id number] ? ?CC: No chief complaint on file. ? ? ?HPI ?[name] presents for *** ? ?Past Medical History:  ?Diagnosis Date  ? Ankle fracture, left   ? "casted; no OR"  ? Chronic lower back pain   ? COPD (chronic obstructive pulmonary disease) (HCC)   ? Lung nodules 09/12/2017  ? Tobacco abuse   ? ? ?Past Surgical History:  ?Procedure Laterality Date  ? INCISION AND DRAINAGE OF WOUND Left ~ 1977  ? "splinter got infected between" digits 2 & 3  ? ? ?Family History  ?Problem Relation Age of Onset  ? Esophageal cancer Father   ? Colon cancer Sister   ? Cancer - Other Brother   ? Heart disease Mother   ? Diabetes Mellitus II Mother   ? Heart disease Other   ?     multiple maternal family members  ? Diabetes Mellitus II Other   ?     multiple maternal family members  ? ? ?Social History  ? ?Socioeconomic History  ? Marital status: Divorced  ?  Spouse name: Not on file  ? Number of children: Not on file  ? Years of education: Not on file  ? Highest education level: Not on file  ?Occupational History  ? Not on file  ?Tobacco Use  ? Smoking status: Every Day  ?  Packs/day: 0.25  ?  Years: 43.00  ?  Pack years: 10.75  ?  Types: Cigarettes  ? Smokeless tobacco: Never  ? Tobacco comments:  ?  currently on patch  ?Substance and Sexual Activity  ? Alcohol use: No  ?  Alcohol/week: 0.0 standard drinks  ?  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 Drug use: Yes  ?  Comment: 09/21/2013 "used marijuana once when I was 20; used cocaine  in ~ 2000; stopped in ~ 2003"  ? Sexual activity: Not Currently  ?Other Topics Concern  ? Not on file  ?Social History Narrative  ? Lives in trailer.  Not married.  5 children.    ? ?Social Determinants of Health  ? ?Financial Resource Strain: Not on file  ?Food Insecurity: Not on file  ?Transportation Needs: Not on file  ?Physical Activity: Not on file  ?Stress: Not on  file  ?Social Connections: Not on file  ?Intimate Partner Violence: Not on file  ? ? ?Outpatient Medications Prior to Visit  ?Medication Sig Dispense Refill  ? acetaminophen (TYLENOL) 325 MG tablet Take 2 tablets (650 mg total) by mouth every 6 (six) hours as needed for mild pain (or Fever >/= 101).    ? albuterol (PROAIR HFA) 108 (90 Base) MCG/ACT inhaler Inhale 2 puffs into the lungs every 6 (six) hours as needed for wheezing or shortness of breath. This is necessary and a must have for patients health 8.5 g 2  ? albuterol (PROVENTIL) (2.5 MG/3ML) 0.083% nebulizer solution USE 1 VIAL VIA NEBULIZER EVERY 6 HOURS AS NEEDED FOR WHEEZING OR SHORTNESS OF BREATH 90 mL 1  ? aspirin EC 81 MG tablet Take 1 tablet (81 mg total) by mouth daily. 100 tablet 1  ? benzonatate (TESSALON) 100 MG capsule Take 1 capsule (100 mg total) by mouth 3 (three) times daily as needed for cough. 30 capsule 0  ? budesonide-formoterol (SYMBICORT) 160-4.5 MCG/ACT inhaler Inhale 2 puffs into the lungs 2 (two) times daily. 10.2 g  1  ? diphenhydramine-acetaminophen (TYLENOL PM) 25-500 MG TABS tablet Take 1 tablet by mouth at bedtime as needed.    ? doxycycline (VIBRA-TABS) 100 MG tablet Take 1 tablet (100 mg total) by mouth 2 (two) times daily. 14 tablet 0  ? HYDROcodone-acetaminophen (NORCO) 7.5-325 MG tablet Take 1 tablet by mouth 2 (two) times daily as needed.    ? Multiple Vitamins-Minerals (MULTIVITAMIN ADULTS 50+) TABS Take 1 tablet by mouth daily.    ? naloxone (NARCAN) nasal spray 4 mg/0.1 mL 1 spray as directed.    ? Respiratory Therapy Supplies (FLUTTER) DEVI Use 4 times daily 1 each 0  ? traZODone (DESYREL) 100 MG tablet Take by mouth.    ? umeclidinium bromide (INCRUSE ELLIPTA) 62.5 MCG/ACT AEPB Inhale 1 puff into the lungs daily. 30 each 4  ? ?No facility-administered medications prior to visit.  ? ? ?No Known Allergies ? ?ROS ?Review of Systems ? ?  ?Objective:  ?  ?Physical Exam ? ?There were no vitals taken for this visit. ?Wt  Readings from Last 3 Encounters:  ?03/23/21 119 lb (54 kg)  ?11/27/20 126 lb 12.8 oz (57.5 kg)  ?07/10/20 124 lb 12.8 oz (56.6 kg)  ? ? ? ?Health Maintenance Due  ?Topic Date Due  ? COLONOSCOPY (Pts 45-92yrs Insurance coverage will need to be confirmed)  Never done  ? Zoster Vaccines- Shingrix (1 of 2) Never done  ? Pneumonia Vaccine 3+ Years old (2 - PCV) 09/24/2014  ? COVID-19 Vaccine (3 - Booster for Pfizer series) 12/22/2019  ? ? ?There are no preventive care reminders to display for this patient. ? ?Lab Results  ?Component Value Date  ? TSH 0.227 (L) 07/22/2017  ? ?Lab Results  ?Component Value Date  ? WBC 11.9 (H) 03/23/2021  ? HGB 16.0 03/23/2021  ? HCT 47.4 03/23/2021  ? MCV 92.0 03/23/2021  ? PLT 265 03/23/2021  ? ?Lab Results  ?Component Value Date  ? NA 137 03/23/2021  ? K 3.9 03/23/2021  ? CO2 26 03/23/2021  ? GLUCOSE 127 (H) 03/23/2021  ? BUN 9 03/23/2021  ? CREATININE 0.79 03/23/2021  ? BILITOT 0.4 03/23/2021  ? ALKPHOS 64 03/23/2021  ? AST 21 03/23/2021  ? ALT 10 03/23/2021  ? PROT 7.8 03/23/2021  ? ALBUMIN 4.1 03/23/2021  ? CALCIUM 9.3 03/23/2021  ? ANIONGAP 13 03/23/2021  ? ?Lab Results  ?Component Value Date  ? CHOL 153 07/22/2017  ? ?Lab Results  ?Component Value Date  ? HDL 37 (L) 07/22/2017  ? ?Lab Results  ?Component Value Date  ? LDLCALC 101 (H) 07/22/2017  ? ?Lab Results  ?Component Value Date  ? TRIG 76 07/22/2017  ? ?Lab Results  ?Component Value Date  ? CHOLHDL 4.1 07/22/2017  ? ?Lab Results  ?Component Value Date  ? HGBA1C 5.5 12/28/2019  ? ? ?  ?Assessment & Plan:  ? ?Problem List Items Addressed This Visit   ?None ? ? ?No orders of the defined types were placed in this encounter. ? ? ?Follow-up: No follow-ups on file.  ? ? ?Shan Levans, MD ?

## 2021-04-10 ENCOUNTER — Ambulatory Visit: Payer: Self-pay | Admitting: Critical Care Medicine

## 2021-04-11 ENCOUNTER — Telehealth: Payer: Self-pay | Admitting: Internal Medicine

## 2021-04-11 NOTE — Telephone Encounter (Signed)
Will forward to provider  

## 2021-04-11 NOTE — Telephone Encounter (Signed)
Copied from CRM [id number]. Topic: General - Other ?>> Apr 10, 2021  2:26 PM Gaetana Michaelis A wrote: ?Reason for CRM: The patient would like to speak with a member of staff when possible ? ?The patient has been told by their insurance provider that if they're able to provide additional documentation from their PCP or Dr. Delford Field stating the need for them to have brand name medications rather than generics, their insurer will agree to cover the cost  ? ?Please contact further ?

## 2021-04-11 NOTE — Telephone Encounter (Signed)
I agree with this plan.

## 2021-04-12 ENCOUNTER — Other Ambulatory Visit: Payer: Self-pay

## 2021-04-13 ENCOUNTER — Telehealth: Payer: Self-pay

## 2021-04-13 ENCOUNTER — Other Ambulatory Visit: Payer: Self-pay

## 2021-04-13 ENCOUNTER — Other Ambulatory Visit: Payer: Self-pay | Admitting: Critical Care Medicine

## 2021-04-13 MED ORDER — LEVALBUTEROL TARTRATE 45 MCG/ACT IN AERO
2.0000 | INHALATION_SPRAY | RESPIRATORY_TRACT | 12 refills | Status: DC | PRN
  Filled 2021-04-13: qty 1, fill #0

## 2021-04-13 MED ORDER — LEVALBUTEROL TARTRATE 45 MCG/ACT IN AERO
2.0000 | INHALATION_SPRAY | RESPIRATORY_TRACT | 12 refills | Status: DC | PRN
  Filled 2021-04-13: qty 15, 25d supply, fill #0

## 2021-04-13 NOTE — Telephone Encounter (Signed)
He has never tried levalbuterol  that's Xopenex ??  I can order that one  ?

## 2021-04-13 NOTE — Addendum Note (Signed)
Addended by: Shan Levans E on: 04/13/2021 11:52 AM ? ? Modules accepted: Orders ? ?

## 2021-04-13 NOTE — Telephone Encounter (Signed)
PA for Ventolin has been denied, patient must have trial and failure of Xopenex HFA for approval.  Prescription has been changed to Xopenex HFA due to insurance preference.  If Xopenex does not work for patient after trial then we can attempt another PA for the Ventolin. ?

## 2021-04-16 ENCOUNTER — Other Ambulatory Visit: Payer: Self-pay

## 2021-04-23 ENCOUNTER — Other Ambulatory Visit: Payer: Self-pay

## 2021-05-08 ENCOUNTER — Telehealth: Payer: Medicaid Other | Admitting: Internal Medicine

## 2021-05-08 ENCOUNTER — Ambulatory Visit: Payer: Medicare Other | Attending: Internal Medicine | Admitting: Internal Medicine

## 2021-05-08 DIAGNOSIS — F17200 Nicotine dependence, unspecified, uncomplicated: Secondary | ICD-10-CM | POA: Diagnosis not present

## 2021-05-08 DIAGNOSIS — R918 Other nonspecific abnormal finding of lung field: Secondary | ICD-10-CM

## 2021-05-08 DIAGNOSIS — J449 Chronic obstructive pulmonary disease, unspecified: Secondary | ICD-10-CM | POA: Diagnosis not present

## 2021-05-08 MED ORDER — LEVALBUTEROL TARTRATE 45 MCG/ACT IN AERO: 2.0000 | INHALATION_SPRAY | RESPIRATORY_TRACT | 12 refills | Status: DC | PRN

## 2021-05-08 MED ORDER — UMECLIDINIUM BROMIDE 62.5 MCG/ACT IN AEPB: 1.0000 | INHALATION_SPRAY | Freq: Every day | RESPIRATORY_TRACT | 6 refills | Status: DC

## 2021-05-08 MED ORDER — AZITHROMYCIN 250 MG PO TABS: ORAL_TABLET | ORAL | 0 refills | Status: DC

## 2021-05-08 MED ORDER — PREDNISONE 20 MG PO TABS: 40.0000 mg | ORAL_TABLET | Freq: Every day | ORAL | 0 refills | Status: DC

## 2021-05-08 MED ORDER — BUDESONIDE-FORMOTEROL FUMARATE 160-4.5 MCG/ACT IN AERO: 2.0000 | INHALATION_SPRAY | Freq: Two times a day (BID) | RESPIRATORY_TRACT | 6 refills | Status: DC

## 2021-05-08 MED ORDER — ALBUTEROL SULFATE (2.5 MG/3ML) 0.083% IN NEBU: INHALATION_SOLUTION | RESPIRATORY_TRACT | 6 refills | Status: DC

## 2021-05-08 NOTE — Progress Notes (Signed)
Patient ID: Corey Lawrence, male   DOB: 05-17-1955, 66 y.o.   MRN: [id number] ?Virtual Visit via Telephone Note ? ?I connected with Corey Lawrence on 05/08/2021 at 11:09 a.[place]by telephone and verified that I am speaking with the correct person using two identifiers ? ?Location: ?Patient: home ?Provider: office ? ?Participants: ?Myself ?Patient ? ?  ?I discussed the limitations, risks, security and privacy concerns of performing an evaluation and management service by telephone and the availability of in person appointments. I also discussed with the patient that there may be a patient responsible charge related to this service. The patient expressed understanding and agreed to proceed. ? ? ?History of Present Illness: ?COPD with chronic bronchitis, lung nodules, Hep C treated, tob dep, chronic LBP and anxiety disorder, aortic atherosclerosis. ? ?COPD: Patient was seen in the emergency room 03/23/2021 with flare of COPD.  Chest x-ray revealed no acute findings.  ?Since then we have been going back and forth about the Ventolin inhaler which was denied by his insurance.  As to try Xopenex or ProAir first.  ?-Patient reports Xopenex and ProAir do not work for him.  Only Ventolin does. ?He never did pick up the prescription for the Xopenex inhaler to give it a try.  He tells me that he had used a friend's and did not feel that it worked. ?-In desperation, he tells me he started purchasing Ventolin inhaler off the street and using it 5 times a day and neb 3-4x/day.  Reports compliance with Symbicort and Incruse.  ? ?Still smoking 4 cigarettes/day.  Reports he will have no choice but to quit.  Feels patches do not work for him.  Sucking on butter scotch candy is the only thing that helps.  He tells me that he eats a lot of candy and drinks a lot of [facility]. ?-O2 level range 94-96% on RA but battery on Pox gave out last wk.  Feels O2 should be lower based on how bad he feels in the mornings ?Limited in his ADL especially  first thing in the mornings.  Has to use neb machine to help loosen up phlegm in his chest. ?-Missed last appointment with Dr. Delford Field because he was called for jury duty.  He also missed CT of the chest that Dr. Delford Field had ordered 11/2020 for follow-up of lung nodules.  He would like to have that rescheduled. ?-Request refill on all of his inhalers and nebulizer treatments.  Also requests prescription for prednisone and abx as he feels his breathing is a bit off for the past several days. ? ?He will be moving to [facility] on the 24th of this month.  Wants to know whether I know of a pulmonologist down there that I can refer him to.  However he tells me he plans to keep his appointment with Dr. Delford Field next month. ? ? ?Outpatient Encounter Medications as of 05/08/2021  ?Medication Sig  ? acetaminophen (TYLENOL) 325 MG tablet Take 2 tablets (650 mg total) by mouth every 6 (six) hours as needed for mild pain (or Fever >/= 101).  ? albuterol (PROVENTIL) (2.5 MG/3ML) 0.083% nebulizer solution USE 1 VIAL VIA NEBULIZER EVERY 6 HOURS AS NEEDED FOR WHEEZING OR SHORTNESS OF BREATH  ? aspirin EC 81 MG tablet Take 1 tablet (81 mg total) by mouth daily.  ? benzonatate (TESSALON) 100 MG capsule Take 1 capsule (100 mg total) by mouth 3 (three) times daily as needed for cough.  ? budesonide-formoterol (SYMBICORT) 160-4.5 MCG/ACT  inhaler Inhale 2 puffs into the lungs 2 (two) times daily.  ? diphenhydramine-acetaminophen (TYLENOL PM) 25-500 MG TABS tablet Take 1 tablet by mouth at bedtime as needed.  ? doxycycline (VIBRA-TABS) 100 MG tablet Take 1 tablet (100 mg total) by mouth 2 (two) times daily.  ? HYDROcodone-acetaminophen (NORCO) 7.5-325 MG tablet Take 1 tablet by mouth 2 (two) times daily as needed.  ? levalbuterol (XOPENEX HFA) 45 MCG/ACT inhaler Inhale 2 puffs into the lungs every 4 (four) hours as needed for wheezing or shortness of breath.  ? Multiple Vitamins-Minerals (MULTIVITAMIN ADULTS 50+) TABS Take 1  tablet by mouth daily.  ? naloxone (NARCAN) nasal spray 4 mg/0.1 mL 1 spray as directed.  ? Respiratory Therapy Supplies (FLUTTER) DEVI Use 4 times daily  ? traZODone (DESYREL) 100 MG tablet Take by mouth.  ? umeclidinium bromide (INCRUSE ELLIPTA) 62.5 MCG/ACT AEPB Inhale 1 puff into the lungs daily.  ? ?No facility-administered encounter medications on file as of 05/08/2021.  ? ?. ?  ?  ?Observations/Objective: ?No direct observation done as this was a telephone encounter.  Patient was able to talk in complete sentences. ? ?Assessment and Plan: ?1. 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(HCC) ?-Patient with advanced COPD. ?I recommend pulmonary rehab to assist with better breathing to allow him to perform some of his ADLs more comfortably.  Patient declined. ?-Strongly advised smoking cessation.  Declines trial of Chantix or any other medication.  He tells me it would be a waste of time. ?-Advised patient to pick up the prescription for the Xopenex and give it a try.  If after picking up the prescription and using it for several days he finds it not to be as helpful as the Ventolin, then we can try to get prior approval from his insurance on the Ventolin.  Advised against buying medications off the street. ? ?- albuterol (PROVENTIL) (2.5 MG/3ML) 0.083% nebulizer solution; USE 1 VIAL VIA NEBULIZER EVERY 6 HOURS AS NEEDED FOR WHEEZING OR SHORTNESS OF BREATH  Dispense: 90 mL; Refill: 6 ?- budesonide-formoterol (SYMBICORT) 160-4.5 MCG/ACT inhaler; Inhale 2 puffs into the lungs 2 (two) times daily.  Dispense: 10.2 g; Refill: 6 ?- levalbuterol (XOPENEX HFA) 45 MCG/ACT inhaler; Inhale 2 puffs into the lungs every 4 (four) hours as needed for wheezing or shortness of breath.  Dispense: 15 g; Refill: 12 ?- umeclidinium bromide (INCRUSE ELLIPTA) 62.5 MCG/ACT AEPB; Inhale 1 puff into the lungs daily.  Dispense: 30 each; Refill: 6 ?- azithromycin (ZITHROMAX Z-PAK) 250 MG tablet; 2 tab Po x1 then 1 tab PO daily  Dispense: 6 each; Refill:  0 ?- predniSONE (DELTASONE) 20 MG tablet; Take 2 tablets (40 mg total) by mouth daily with breakfast.  Dispense: 10 tablet; Refill: 0 ? ?2. Tobacco dependence ?Strongly advised to quit.  Patient states he knows he should quit and reports that he is working on it.  He declines any medications to help him quit. ? ?3. Multiple lung nodules on CT ?Patient is agreeable to having the CAT scan that was ordered by Dr. Delford Field in October be scheduled.  Message sent to CMA to reschedule. ? ?Given that he will be moving to [facility] later this month, I recommended that he get established with a primary care and pulmonologist once he is settled in.  Advised that he would have to call around once they are to find a primary and pulmonologist. ? ? ?Follow Up Instructions: ?Pt plans to keep appt with Dr. Delford Field next mth ?  ?I  discussed the assessment and treatment plan with the patient. The patient was provided an opportunity to ask questions and all were answered. The patient agreed with the plan and demonstrated an understanding of the instructions. ?  ?The patient was advised to call back or seek an in-person evaluation if the symptoms worsen or if the condition fails to improve as anticipated. ? ?I  Spent 21 minutes on this telephone encounter ? ?This note has been created with Education officer, environmentalDragon speech recognition software and smart phrase technology. Any transcriptional errors are unintentional. ? ?Jonah Blueeborah Talor Desrosiers, MD ? ?

## 2021-05-09 ENCOUNTER — Telehealth: Payer: Self-pay | Admitting: Internal Medicine

## 2021-05-09 DIAGNOSIS — R918 Other nonspecific abnormal finding of lung field: Secondary | ICD-10-CM

## 2021-05-09 NOTE — Telephone Encounter (Signed)
-----   Message from Particia Lather, [place] sent at 05/08/2021 12:10 PM EDT ----- ?Regarding: RE: CT chest ?Would you be able to change location to  imaging  ?----- Message ----- ?From: Marcine Matar, MD ?Sent: 05/08/2021  11:58 AM EDT ?To: Particia Lather, RMA ?Subject: CT chest                                      ? ?Pt would like to get CT chest that was ordered by Dr. Delford Field 11/2020 reschedule.  He cancel or no-showed because he was called for jury duty. ? ? ?

## 2021-05-09 NOTE — Addendum Note (Signed)
Addended by: Karle Plumber B on: 05/09/2021 11:58 AM ? ? Modules accepted: Orders ? ?

## 2021-06-14 ENCOUNTER — Encounter: Payer: Self-pay | Admitting: Critical Care Medicine

## 2021-06-14 ENCOUNTER — Ambulatory Visit: Payer: Medicare Other | Attending: Critical Care Medicine | Admitting: Critical Care Medicine

## 2021-06-14 DIAGNOSIS — F17210 Nicotine dependence, cigarettes, uncomplicated: Secondary | ICD-10-CM

## 2021-06-14 DIAGNOSIS — F17200 Nicotine dependence, unspecified, uncomplicated: Secondary | ICD-10-CM | POA: Diagnosis not present

## 2021-06-14 DIAGNOSIS — B182 Chronic viral hepatitis C: Secondary | ICD-10-CM | POA: Diagnosis not present

## 2021-06-14 DIAGNOSIS — I700 Atherosclerosis of aorta: Secondary | ICD-10-CM | POA: Diagnosis not present

## 2021-06-14 DIAGNOSIS — R918 Other nonspecific abnormal finding of lung field: Secondary | ICD-10-CM

## 2021-06-14 DIAGNOSIS — J449 Chronic obstructive pulmonary disease, unspecified: Secondary | ICD-10-CM

## 2021-06-14 DIAGNOSIS — J4489 Other specified chronic obstructive pulmonary disease: Secondary | ICD-10-CM

## 2021-06-14 MED ORDER — UMECLIDINIUM BROMIDE 62.5 MCG/ACT IN AEPB: 1.0000 | INHALATION_SPRAY | Freq: Every day | RESPIRATORY_TRACT | 6 refills | Status: AC

## 2021-06-14 MED ORDER — NICOTINE POLACRILEX 4 MG MT LOZG: LOZENGE | OROMUCOSAL | 4 refills | Status: DC

## 2021-06-14 MED ORDER — PREDNISONE 10 MG PO TABS
ORAL_TABLET | ORAL | 0 refills | Status: DC
Start: 2021-06-14 — End: 2022-08-29

## 2021-06-14 MED ORDER — NICOTINE 14 MG/24HR TD PT24: 14.0000 mg | MEDICATED_PATCH | Freq: Every day | TRANSDERMAL | 0 refills | Status: DC

## 2021-06-14 MED ORDER — SYMBICORT 160-4.5 MCG/ACT IN AERO: 2.0000 | INHALATION_SPRAY | Freq: Two times a day (BID) | RESPIRATORY_TRACT | 6 refills | Status: DC

## 2021-06-14 MED ORDER — CEFDINIR 300 MG PO CAPS: 300.0000 mg | ORAL_CAPSULE | Freq: Two times a day (BID) | ORAL | 0 refills | Status: AC

## 2021-06-14 MED ORDER — ALBUTEROL SULFATE HFA 108 (90 BASE) MCG/ACT IN AERS: 2.0000 | INHALATION_SPRAY | Freq: Four times a day (QID) | RESPIRATORY_TRACT | 4 refills | Status: DC | PRN

## 2021-06-14 NOTE — Assessment & Plan Note (Signed)
Chronic obstructive lung disease failure to respond to therapy due to ongoing tobacco use ?We exacerbating currently at this time ? ?Plan to give another course of prednisone 40 mg a day for 5 days and will give cefdinir 300 mg twice a day for 7 days ? ?We will order a Ventolin HFA inhaler in place of Xopenex ? ?I went over with the patient proper use of inhaled medicines and retrained him as to proper HFA technique ?

## 2021-06-14 NOTE — Assessment & Plan Note (Signed)
Repeat CT scanning will be obtained ?

## 2021-06-14 NOTE — Patient Instructions (Addendum)
We will get your CT scan scheduled the imaging center downstairs will be doing the study ? ?Stop smoking using both nicotine patch and nicotine lozenge and see attachment ? ?We will authorize Ventolin inhaler in place of Xopenex prescription sent to your local pharmacy ? ?Take prednisone 4 tablets daily for 5 days and take cefdinir twice daily for 7 days an antibiotic for bronchitis ? ?No change in other inhalers refill sent to your pharmacy ? ?Return to see Dr. Delford Field with a video visit in 3 months ?

## 2021-06-14 NOTE — Progress Notes (Signed)
? ?Established Patient Office Visit ? ?Subjective:  ?Patient ID: DEPRIEST Corey Lawrence, male    DOB: 13-May-1955  Age: 66 y.o. MRN: [id number] ? ?CC:  ?No chief complaint on file. ? ? ?HPI ?Corey Lawrence Cooley presents for COPD follow-up.  Note patient was just in about 10 days ago for COPD exacerbation received a course of azithromycin and prednisone and is improved.  He is still smoking about 1/4 pack a week of cigarettes.  On arrival blood pressure 134/57.  He does agree to receive the flu vaccine.  He is due a CT scanning follow-up of his chest for lung nodules. ?The patient has received his pneumonia vaccine. ? ?Patient is still coughing up green mucus in the morning but it is less.  He is still short of breath with exertion.  He states the triggers are smoking for him are eating and going to bed at night. ? ?Patient has no other complaints.  He is on chronic pain management program.  He does need refills on his inhalers.  He does maintain the Symbicort albuterol and Incruse.  He uses about 2 canisters of albuterol a month. ? ?5/11 ?Patient is seen in follow-up for COPD has had increased cough shortness of breath and some wheezing.  He is still smoking 6 cigarettes daily.  He now lives near [facility] in his mother's home.  His mother recently passed away.  He has had pneumonia twice over the past year.  He would like to get a Ventolin inhaler he states it works better for him.  Patient maintains Incruse and Symbicort at this time.  Patient needs CT scan obtained for lung nodules this is yet to be scheduled.  Patient is due a pneumonia vaccine however he declined to receive it at this visit ? ?Past Medical History:  ?Diagnosis Date  ? Ankle fracture, left   ? "casted; no OR"  ? Chronic lower back pain   ? COPD (chronic obstructive pulmonary disease) ([place])   ? Lung nodules 09/12/2017  ? Tobacco abuse   ? ? ?Past Surgical History:  ?Procedure Laterality Date  ? INCISION AND DRAINAGE OF WOUND Left ~ 1977  ? "splinter  got infected between" digits 2 & 3  ? ? ?Family History  ?Problem Relation Age of Onset  ? Esophageal cancer Father   ? Colon cancer Sister   ? Cancer - Other Brother   ? Heart disease Mother   ? Diabetes Mellitus II Mother   ? Heart disease Other   ?     multiple maternal family members  ? Diabetes Mellitus II Other   ?     multiple maternal family members  ? ? ?Social History  ? ?Socioeconomic History  ? Marital status: Divorced  ?  Spouse name: Not on file  ? Number of children: Not on file  ? Years of education: Not on file  ? Highest education level: Not on file  ?Occupational History  ? Not on file  ?Tobacco Use  ? Smoking status: Every Day  ?  Packs/day: 0.25  ?  Years: 43.00  ?  Pack years: 10.75  ?  Types: Cigarettes  ? Smokeless tobacco: Never  ? Tobacco comments:  ?  currently on patch  ?Substance and Sexual Activity  ? Alcohol use: No  ?  Alcohol/week: 0.0 standard drinks  ?  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 Drug use: Yes  ?  Comment: 09/21/2013 "used marijuana once when I was 20; used cocaine  in ~ 2000; stopped in ~ 2003"  ? Sexual activity: Not Currently  ?Other Topics Concern  ? Not on file  ?Social History Narrative  ? Lives in trailer.  Not married.  5 children.    ? ?Social Determinants of Health  ? ?Financial Resource Strain: Not on file  ?Food Insecurity: Not on file  ?Transportation Needs: Not on file  ?Physical Activity: Not on file  ?Stress: Not on file  ?Social Connections: Not on file  ?Intimate Partner Violence: Not on file  ? ? ?Outpatient Medications Prior to Visit  ?Medication Sig Dispense Refill  ? acetaminophen (TYLENOL) 325 MG tablet Take 2 tablets (650 mg total) by mouth every 6 (six) hours as needed for mild pain (or Fever >/= 101).    ? albuterol (PROVENTIL) (2.5 MG/3ML) 0.083% nebulizer solution USE 1 VIAL VIA NEBULIZER EVERY 6 HOURS AS NEEDED FOR WHEEZING OR SHORTNESS OF BREATH 90 mL 6  ? aspirin EC 81 MG tablet Take 1 tablet (81 mg total) by mouth daily. 100 tablet 1   ? benzonatate (TESSALON) 100 MG capsule Take 1 capsule (100 mg total) by mouth 3 (three) times daily as needed for cough. 30 capsule 0  ? diphenhydramine-acetaminophen (TYLENOL PM) 25-500 MG TABS tablet Take 1 tablet by mouth at bedtime as needed.    ? HYDROcodone-acetaminophen (NORCO) 7.5-325 MG tablet Take 1 tablet by mouth 2 (two) times daily as needed.    ? Multiple Vitamins-Minerals (MULTIVITAMIN ADULTS 50+) TABS Take 1 tablet by mouth daily.    ? naloxone (NARCAN) nasal spray 4 mg/0.1 mL 1 spray as directed.    ? Respiratory Therapy Supplies (FLUTTER) DEVI Use 4 times daily 1 each 0  ? traZODone (DESYREL) 100 MG tablet Take by mouth.    ? azithromycin (ZITHROMAX Z-PAK) 250 MG tablet 2 tab Po x1 then 1 tab PO daily 6 each 0  ? budesonide-formoterol (SYMBICORT) 160-4.5 MCG/ACT inhaler Inhale 2 puffs into the lungs 2 (two) times daily. 10.2 g 6  ? predniSONE (DELTASONE) 20 MG tablet Take 2 tablets (40 mg total) by mouth daily with breakfast. 10 tablet 0  ? umeclidinium bromide (INCRUSE ELLIPTA) 62.5 MCG/ACT AEPB Inhale 1 puff into the lungs daily. 30 each 6  ? levalbuterol (XOPENEX HFA) 45 MCG/ACT inhaler Inhale 2 puffs into the lungs every 4 (four) hours as needed for wheezing or shortness of breath. (Patient not taking: Reported on 06/14/2021) 15 g 12  ? ?No facility-administered medications prior to visit.  ? ? ?No Known Allergies ? ?ROS ?Review of Systems  ?Constitutional:  Negative for chills, diaphoresis and fever.  ?HENT:  Negative for congestion, hearing loss, nosebleeds, sore throat and tinnitus.   ?Eyes:  Negative for photophobia and redness.  ?Respiratory:  Positive for cough, chest tightness, shortness of breath and wheezing. Negative for stridor.   ?     Thin green mucus with small amounts daily  ?Cardiovascular:  Negative for chest pain, palpitations and leg swelling.  ?Gastrointestinal:  Negative for abdominal pain, blood in stool, constipation, diarrhea, nausea and vomiting.  ?Endocrine: Negative  for polydipsia.  ?Genitourinary:  Negative for dysuria, flank pain, frequency, hematuria and urgency.  ?Musculoskeletal:  Negative for back pain, myalgias and neck pain.  ?Skin:  Negative for rash.  ?Allergic/Immunologic: Negative for environmental allergies.  ?Neurological:  Negative for dizziness, tremors, seizures, weakness and headaches.  ?Hematological:  Does not bruise/bleed easily.  ?Psychiatric/Behavioral:  Negative for suicidal ideas. The patient is not nervous/anxious.   ? ?  ?  Objective:  ?  ?Physical Exam ?Vitals reviewed.  ?Constitutional:   ?   Appearance: Normal appearance. He is well-developed. He is not diaphoretic.  ?   Comments: Thin  ?HENT:  ?   Head: Normocephalic and atraumatic.  ?   Nose: No nasal deformity, septal deviation, mucosal edema or rhinorrhea.  ?   Right Sinus: No maxillary sinus tenderness or frontal sinus tenderness.  ?   Left Sinus: No maxillary sinus tenderness or frontal sinus tenderness.  ?   Mouth/Throat:  ?   Mouth: Mucous membranes are moist.  ?   Pharynx: Oropharynx is clear. No oropharyngeal exudate.  ?Eyes:  ?   General: No scleral icterus. ?   Conjunctiva/sclera: Conjunctivae normal.  ?   Pupils: Pupils are equal, round, and reactive to light.  ?Neck:  ?   Thyroid: No thyromegaly.  ?   Vascular: No carotid bruit or JVD.  ?   Trachea: Trachea normal. No tracheal tenderness or tracheal deviation.  ?Cardiovascular:  ?   Rate and Rhythm: Normal rate and regular rhythm.  ?   Chest Wall: PMI is not displaced.  ?   Pulses: Normal pulses. No decreased pulses.  ?   Heart sounds: Normal heart sounds, S1 normal and S2 normal. Heart sounds not distant. No murmur heard. ?No systolic murmur is present.  ?No diastolic murmur is present.  ?  No friction rub. No gallop. No S3 or S4 sounds.  ?Pulmonary:  ?   Effort: Pulmonary effort is normal. No tachypnea, accessory muscle usage or respiratory distress.  ?   Breath sounds: No stridor. Wheezing present. No decreased breath sounds, rhonchi  or rales.  ?Chest:  ?   Chest wall: No tenderness.  ?Abdominal:  ?   General: Bowel sounds are normal. There is no distension.  ?   Palpations: Abdomen is soft. Abdomen is not rigid.  ?   Tendernes

## 2021-06-14 NOTE — Assessment & Plan Note (Signed)
? ? ??   Current smoking consumption amount: 1 pack a week ? ?? Dicsussion on advise to quit smoking and smoking impacts: Behavioral modification medication treatments ? ?? Patient's willingness to quit: a lot of stress in his life a barrier to quit ? ?? Methods to quit smoking discussed: Try doubled nicotine replacement with patch and lozenge ? ?Medication management of smoking session drugs discussed: We will attempt to use nicotine patch 14 mg daily along with 4 mg 3-4 times daily lozenge nicotine ?? Resources provided:  AVS  ? ?? Setting quit date not established ? ?? Follow-up arranged 4 months ? ? ?Time spent counseling the patient:  ? ? ?

## 2021-09-28 ENCOUNTER — Telehealth: Payer: Medicare Other | Admitting: Internal Medicine

## 2021-10-01 ENCOUNTER — Ambulatory Visit (HOSPITAL_BASED_OUTPATIENT_CLINIC_OR_DEPARTMENT_OTHER): Payer: Medicare Other | Admitting: Critical Care Medicine

## 2021-10-01 ENCOUNTER — Encounter: Payer: Self-pay | Admitting: Critical Care Medicine

## 2021-10-01 DIAGNOSIS — Z91199 Patient's noncompliance with other medical treatment and regimen due to unspecified reason: Secondary | ICD-10-CM

## 2021-10-01 NOTE — Progress Notes (Signed)
Patient ID: Corey Lawrence, male   DOB: 11-10-1955, 66 y.o.   MRN: [id number] Patient did not answer when I called

## 2021-10-05 ENCOUNTER — Ambulatory Visit: Payer: Self-pay | Admitting: *Deleted

## 2021-10-05 NOTE — Telephone Encounter (Signed)
  Chief Complaint: productive cough Symptoms: green sputum, "lung hurts" Frequency: constant Pertinent Negatives: Patient denies fever Disposition: [] ED /[x] Urgent Care (no appt availability in office) / [] Appointment(In office/virtual)/ []  [facility]/ [] Home Care/ [] Refused Recommended Disposition /[] [facility]/ [x]  Follow-up with PCP Additional Notes: Pt states he believes he has pna because he has had it several times before. He is requesting Prednisone, antibiotic, and inhaler. Pt did not attend video visit this week, states he missed the call. There are no appt. Pt advised to go to UC/ED. Pt still requesting medication, his wife is positive for COVID and he just drove her to the doctor, now he is afraid he will get COVID. Pt advised UC, he is asking for rx to be sent to in [place], . This is where he lives.  Reason for Disposition  Chest pain  (Exception: MILD central chest pain, present only when coughing.)  Answer Assessment - Initial Assessment Questions 1. ONSET: "When did the cough begin?"      A while, sputum turned green Tuesday, was yellow first 2. SEVERITY: "How bad is the cough today?"      Productive, pretty constant 3. SPUTUM: "Describe the color of your sputum" (none, dry cough; clear, white, yellow, green)     Green, was yellow 4. HEMOPTYSIS: "Are you coughing up any blood?" If so ask: "How much?" (flecks, streaks, tablespoons, etc.)     no 5. DIFFICULTY BREATHING: "Are you having difficulty breathing?" If Yes, ask: "How bad is it?" (e.g., mild, moderate, severe)    - MILD: No SOB at rest, mild SOB with walking, speaks normally in sentences, can lie down, no retractions, pulse < 100.    - MODERATE: SOB at rest, SOB with minimal exertion and prefers to sit, cannot lie down flat, speaks in phrases, mild retractions, audible wheezing, pulse 100-120.    - SEVERE: Very SOB at rest, speaks in single words, struggling to breathe, sitting  hunched forward, retractions, pulse > 120      8 or 9 6. FEVER: "Do you have a fever?" If Yes, ask: "What is your temperature, how was it measured, and when did it start?"     no 7. CARDIAC HISTORY: "Do you have any history of heart disease?" (e.g., heart attack, congestive heart failure)      no 8. LUNG HISTORY: "Do you have any history of lung disease?"  (e.g., pulmonary embolus, asthma, emphysema)     History of copd and pna 9. PE RISK FACTORS: "Do you have a history of blood clots?" (or: recent major surgery, recent prolonged travel, bedridden)     no 10. OTHER SYMPTOMS: "Do you have any other symptoms?" (e.g., runny nose, wheezing, chest pain)       Chest hurts from coughing, pain from behind back in ribs 11. PREGNANCY: "Is there any chance you are pregnant?" "When was your last menstrual period?"       na 12. TRAVEL: "Have you traveled out of the country in the last month?" (e.g., travel history, exposures)       na  Protocols used: Cough - Acute Productive-A-AH

## 2021-10-10 NOTE — Telephone Encounter (Signed)
Patient was informed of message per Dr. Laural Benes. He states he would like to follow up with someone d/t COPD medical condition but is in [place], [place], a very remote area. He does not know where to go.   Patient does not sound in distress. Able to speak in full sentences. States he was on the way to [facility] to get a Covid test.   Phone call was disconnected. Attempt to call patient back but call went to voicemail. Left message to return call.   Would like to see what time he is available today for a telephone visit with [facility].

## 2021-10-10 NOTE — Telephone Encounter (Signed)
Attempt to call patient again to schedule telephone visit, goes to voicemail, unable to leave message.

## 2021-12-08 ENCOUNTER — Other Ambulatory Visit: Payer: Self-pay | Admitting: Internal Medicine

## 2021-12-08 DIAGNOSIS — J4489 Other specified chronic obstructive pulmonary disease: Secondary | ICD-10-CM

## 2021-12-10 NOTE — Telephone Encounter (Signed)
Requested medication (s) are due for refill today: yes  Requested medication (s) are on the active medication list: yes  Last refill:  05/08/21 90 ml 6 RF  Future visit scheduled: no  Notes to clinic:  pt was to f/u and was no show, pt had recent nurse triage call but unable to make appt per chart review.    Requested Prescriptions  Pending Prescriptions Disp Refills   albuterol (PROVENTIL) (2.5 MG/3ML) 0.083% nebulizer solution [Pharmacy Med Name: ALBUTEROL SULFATE 0.083% NEBULIZED SOLN]  6    Sig: USE 1 VIAL VIA NEBULIZER EVERY SIX (6) HOURS AS NEEDED FOR WHEEZING OR SHORTNESS OF BREATH     Pulmonology:  Beta Agonists 2 Passed - 12/08/2021 10:46 AM      Passed - Last BP in normal range    BP Readings from Last 1 Encounters:  06/14/21 128/81         Passed - Last Heart Rate in normal range    Pulse Readings from Last 1 Encounters:  06/14/21 84         Passed - Valid encounter within last 12 months    Recent Outpatient Visits           2 months ago No-show for appointment   [place] Elsie Stain, MD   5 months ago Aortic atherosclerosis [facility])   [place], Patrick E, MD   7 months ago 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)   [place] Ladell Pier, MD   1 year ago 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[facility])   [place] [facility] Elsie Stain, MD   1 year ago COPD exacerbation [facility])   [facility] Elsie Stain, MD

## 2022-01-16 ENCOUNTER — Other Ambulatory Visit: Payer: Self-pay | Admitting: Internal Medicine

## 2022-01-16 DIAGNOSIS — J4489 Other specified chronic obstructive pulmonary disease: Secondary | ICD-10-CM

## 2022-01-22 ENCOUNTER — Other Ambulatory Visit: Payer: Self-pay

## 2022-02-05 ENCOUNTER — Other Ambulatory Visit: Payer: Self-pay | Admitting: Internal Medicine

## 2022-02-05 DIAGNOSIS — J4489 Other specified chronic obstructive pulmonary disease: Secondary | ICD-10-CM

## 2022-02-07 ENCOUNTER — Other Ambulatory Visit: Payer: Self-pay | Admitting: Internal Medicine

## 2022-02-07 DIAGNOSIS — J4489 Other specified chronic obstructive pulmonary disease: Secondary | ICD-10-CM

## 2022-02-07 NOTE — Telephone Encounter (Signed)
Pt is calling to request if albuterol (PROVENTIL) (2.5 MG/3ML) 0.083% nebulizer solution [[id number]] could be resent to  [place], [place] - [address], [place] [address] Phone: [phone]  Fax: [phone]  Pt states that the pharmacy did not receive the script. Please advise

## 2022-04-04 ENCOUNTER — Other Ambulatory Visit: Payer: Self-pay | Admitting: Critical Care Medicine

## 2022-04-04 DIAGNOSIS — J4489 Other specified chronic obstructive pulmonary disease: Secondary | ICD-10-CM

## 2022-04-05 NOTE — Telephone Encounter (Signed)
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)   [place] Elsie Stain, MD   1 year ago COPD exacerbation [facility])   [place] Elsie Stain, MD

## 2022-04-20 ENCOUNTER — Other Ambulatory Visit: Payer: Self-pay | Admitting: Critical Care Medicine

## 2022-04-20 DIAGNOSIS — J4489 Other specified chronic obstructive pulmonary disease: Secondary | ICD-10-CM

## 2022-04-24 ENCOUNTER — Telehealth: Payer: Self-pay | Admitting: Internal Medicine

## 2022-04-24 NOTE — Telephone Encounter (Signed)
Called patient to schedule Medicare Annual Wellness Visit (AWV). No voicemail available to leave a message.  Last date of AWV: due awvi 02/04/22 per [place]        If any questions, please contact me at [phone].  Thank you ,  Barkley Boards AWV direct phone # [phone]

## 2022-05-22 ENCOUNTER — Other Ambulatory Visit: Payer: Self-pay | Admitting: Internal Medicine

## 2022-05-22 DIAGNOSIS — J4489 Other specified chronic obstructive pulmonary disease: Secondary | ICD-10-CM

## 2022-06-11 ENCOUNTER — Other Ambulatory Visit: Payer: Self-pay | Admitting: Internal Medicine

## 2022-06-11 ENCOUNTER — Other Ambulatory Visit: Payer: Self-pay | Admitting: Critical Care Medicine

## 2022-06-11 ENCOUNTER — Telehealth: Payer: Self-pay | Admitting: Internal Medicine

## 2022-06-11 ENCOUNTER — Telehealth: Payer: Self-pay

## 2022-06-11 DIAGNOSIS — J4489 Other specified chronic obstructive pulmonary disease: Secondary | ICD-10-CM

## 2022-06-11 MED ORDER — ALBUTEROL SULFATE (2.5 MG/3ML) 0.083% IN NEBU
INHALATION_SOLUTION | RESPIRATORY_TRACT | 0 refills | Status: DC
Start: 2022-06-11 — End: 2022-07-05

## 2022-06-11 NOTE — Telephone Encounter (Signed)
Requested Prescriptions  Pending Prescriptions Disp Refills   albuterol (VENTOLIN HFA) 108 (90 Base) MCG/ACT inhaler [Pharmacy Med Name: albuterol sulfate HFA 90 mcg/actuation aerosol inhaler] 8.5 g 0    Sig: Inhale 2 puffs into the lungs every 6 (six) hours as needed for wheezing or shortness of breath. 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[facility])   [place] [facility] Marcine Matar, MD   1 year ago 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[facility])   [place] [facility] Storm Frisk, MD   1 year ago COPD exacerbation [facility])   [place] [facility] Storm Frisk, MD       Future Appointments             In 2 months Marcine Matar, MD [facility]            Passed - Last BP in normal range    BP Readings from Last 1 Encounters:  06/14/21 128/81         Passed - Last Heart Rate in normal range    Pulse Readings from Last 1 Encounters:  06/14/21 84

## 2022-06-11 NOTE — Telephone Encounter (Signed)
Copied from CRM [id number]. Topic: General - Other >> Jun 11, 2022  3:13 PM Corey Lawrence wrote: Reason for CRM: The patient would like to speak a member of clinical staff about their refill request of albuterol (PROVENTIL) (2.5 MG/3ML) 0.083% nebulizer solution [[id number]]   Please contact further when possible

## 2022-06-11 NOTE — Telephone Encounter (Signed)
Refill sent to his pharmacy.

## 2022-06-11 NOTE — Addendum Note (Signed)
Addended by: Lois Huxley, Jeannett Senior L on: 06/11/2022 05:26 PM   Modules accepted: Orders

## 2022-06-11 NOTE — Telephone Encounter (Signed)
Medication Refill - Medication: albuterol (VENTOLIN HFA) 108 (90 Base) MCG/ACT inhaler  budesonide-formoterol (SYMBICORT) 160-4.5 MCG/ACT inhaler   albuterol (PROVENTIL) (2.5 MG/3ML) 0.083% nebulizer solution  (pt states that he is needing this medication he is out)   Has the patient contacted their pharmacy? Yes.   Pharmacy states that they do not have a refill for the pt.    Preferred Pharmacy (with phone number or street name): [id number] - [place], [place] - [address]  Phone: [phone] Fax: [phone]  Has the patient been seen for an appointment in the last year OR does the patient have an upcoming appointment? Yes.    Agent: Please be advised that RX refills may take up to 3 business days. We ask that you follow-up with your pharmacy.

## 2022-06-12 NOTE — Telephone Encounter (Signed)
Called & spoke to the patient. Verified name & DOB. Informed patient that refills have been sent to the pharmacy. Patient expressed verbal understanding.

## 2022-06-25 ENCOUNTER — Ambulatory Visit: Payer: 59 | Attending: Internal Medicine

## 2022-06-25 VITALS — Ht 62.0 in | Wt 121.0 lb

## 2022-06-25 DIAGNOSIS — Z0000 Encounter for general adult medical examination without abnormal findings: Secondary | ICD-10-CM | POA: Diagnosis not present

## 2022-06-25 NOTE — Patient Instructions (Signed)
Corey Lawrence , Thank you for taking time to come for your Medicare Wellness Visit. I appreciate your ongoing commitment to your health goals. Please review the following plan we discussed and let me know if I can assist you in the future.   These are the goals we discussed:  Goals      Improve breathing        This is a list of the screening recommended for you and due dates:  Health Maintenance  Topic Date Due   Colon Cancer Screening  Never done   Zoster (Shingles) Vaccine (1 of 2) Never done   Pneumonia Vaccine (2 of 2 - PCV) 09/24/2014   COVID-19 Vaccine (3 - 2023-24 season) 10/05/2021   Flu Shot  09/05/2022   Medicare Annual Wellness Visit  06/25/2023   DTaP/Tdap/Td vaccine (2 - Td or Tdap) 03/22/2027   Hepatitis C Screening: USPSTF Recommendation to screen - Ages 18-79 yo.  Completed   HPV Vaccine  Aged Out    Advanced directives: Information on Advanced Care Planning can be found at [facility] Advance Health Care Directives Advance Health Care Directives ([email or web address]    Conditions/risks identified: Aim for 30 minutes of exercise or brisk walking, 6-8 glasses of water, and 5 servings of fruits and vegetables each day.  Next appointment: Follow up in one year for your annual wellness visit.   Preventive Care 46 Years and Older, Male  Preventive care refers to lifestyle choices and visits with your health care provider that can promote health and wellness. What does preventive care include? A yearly physical exam. This is also called an annual well check. Dental exams once or twice a year. Routine eye exams. Ask your health care provider how often you should have your eyes checked. Personal lifestyle choices, including: Daily care of your teeth and gums. Regular physical activity. Eating a healthy diet. Avoiding tobacco and drug use. Limiting alcohol use. Practicing safe sex. Taking low doses of aspirin every day. Taking vitamin and mineral  supplements as recommended by your health care provider. What happens during an annual well check? The services and screenings done by your health care provider during your annual well check will depend on your age, overall health, lifestyle risk factors, and family history of disease. Counseling  Your health care provider may ask you questions about your: Alcohol use. Tobacco use. Drug use. Emotional well-being. Home and relationship well-being. Sexual activity. Eating habits. History of falls. Memory and ability to understand (cognition). Work and work Astronomer. Screening  You may have the following tests or measurements: Height, weight, and BMI. Blood pressure. Lipid and cholesterol levels. These may be checked every 5 years, or more frequently if you are over 66 years old. Skin check. Lung cancer screening. You may have this screening every year starting at age 83 if you have a 30-pack-year history of smoking and currently smoke or have quit within the past 15 years. Fecal occult blood test (FOBT) of the stool. You may have this test every year starting at age 76. Flexible sigmoidoscopy or colonoscopy. You may have a sigmoidoscopy every 5 years or a colonoscopy every 10 years starting at age 55. Prostate cancer screening. Recommendations will vary depending on your family history and other risks. Hepatitis C blood test. Hepatitis B blood test. Sexually transmitted disease (STD) testing. Diabetes screening. This is done by checking your blood sugar (glucose) after you have not eaten for a while (fasting). You may have this done every  1-3 years. Abdominal aortic aneurysm (AAA) screening. You may need this if you are a current or former smoker. Osteoporosis. You may be screened starting at age 26 if you are at high risk. Talk with your health care provider about your test results, treatment options, and if necessary, the need for more tests. Vaccines  Your health care provider  may recommend certain vaccines, such as: Influenza vaccine. This is recommended every year. Tetanus, diphtheria, and acellular pertussis (Tdap, Td) vaccine. You may need a Td booster every 10 years. Zoster vaccine. You may need this after age 53. Pneumococcal 13-valent conjugate (PCV13) vaccine. One dose is recommended after age 29. Pneumococcal polysaccharide (PPSV23) vaccine. One dose is recommended after age 33. Talk to your health care provider about which screenings and vaccines you need and how often you need them. This information is not intended to replace advice given to you by your health care provider. Make sure you discuss any questions you have with your health care provider. Document Released: 02/17/2015 Document Revised: 10/11/2015 Document Reviewed: 11/22/2014 Elsevier Interactive Patient Education  2017 [employer].  Fall Prevention in the Home Falls can cause injuries. They can happen to people of all ages. There are many things you can do to make your home safe and to help prevent falls. What can I do on the outside of my home? Regularly fix the edges of walkways and driveways and fix any cracks. Remove anything that might make you trip as you walk through a door, such as a raised step or threshold. Trim any bushes or trees on the path to your home. Use bright outdoor lighting. Clear any walking paths of anything that might make someone trip, such as rocks or tools. Regularly check to see if handrails are loose or broken. Make sure that both sides of any steps have handrails. Any raised decks and porches should have guardrails on the edges. Have any leaves, snow, or ice cleared regularly. Use sand or salt on walking paths during winter. Clean up any spills in your garage right away. This includes oil or grease spills. What can I do in the bathroom? Use night lights. Install grab bars by the toilet and in the tub and shower. Do not use towel bars as grab bars. Use  non-skid mats or decals in the tub or shower. If you need to sit down in the shower, use a plastic, non-slip stool. Keep the floor dry. Clean up any water that spills on the floor as soon as it happens. Remove soap buildup in the tub or shower regularly. Attach bath mats securely with double-sided non-slip rug tape. Do not have throw rugs and other things on the floor that can make you trip. What can I do in the bedroom? Use night lights. Make sure that you have a light by your bed that is easy to reach. Do not use any sheets or blankets that are too big for your bed. They should not hang down onto the floor. Have a firm chair that has side arms. You can use this for support while you get dressed. Do not have throw rugs and other things on the floor that can make you trip. What can I do in the kitchen? Clean up any spills right away. Avoid walking on wet floors. Keep items that you use a lot in easy-to-reach places. If you need to reach something above you, use a strong step stool that has a grab bar. Keep electrical cords out of the way.  Do not use floor polish or wax that makes floors slippery. If you must use wax, use non-skid floor wax. Do not have throw rugs and other things on the floor that can make you trip. What can I do with my stairs? Do not leave any items on the stairs. Make sure that there are handrails on both sides of the stairs and use them. Fix handrails that are broken or loose. Make sure that handrails are as long as the stairways. Check any carpeting to make sure that it is firmly attached to the stairs. Fix any carpet that is loose or worn. Avoid having throw rugs at the top or bottom of the stairs. If you do have throw rugs, attach them to the floor with carpet tape. Make sure that you have a light switch at the top of the stairs and the bottom of the stairs. If you do not have them, ask someone to add them for you. What else can I do to help prevent falls? Wear  shoes that: Do not have high heels. Have rubber bottoms. Are comfortable and fit you well. Are closed at the toe. Do not wear sandals. If you use a stepladder: Make sure that it is fully opened. Do not climb a closed stepladder. Make sure that both sides of the stepladder are locked into place. Ask someone to hold it for you, if possible. Clearly mark and make sure that you can see: Any grab bars or handrails. First and last steps. Where the edge of each step is. Use tools that help you move around (mobility aids) if they are needed. These include: Canes. Walkers. Scooters. Crutches. Turn on the lights when you go into a dark area. Replace any light bulbs as soon as they burn out. Set up your furniture so you have a clear path. Avoid moving your furniture around. If any of your floors are uneven, fix them. If there are any pets around you, be aware of where they are. Review your medicines with your doctor. Some medicines can make you feel dizzy. This can increase your chance of falling. Ask your doctor what other things that you can do to help prevent falls. This information is not intended to replace advice given to you by your health care provider. Make sure you discuss any questions you have with your health care provider. Document Released: 11/17/2008 Document Revised: 06/29/2015 Document Reviewed: 02/25/2014 Elsevier Interactive Patient Education  2017 [employer].

## 2022-06-25 NOTE — Progress Notes (Cosign Needed Addendum)
Subjective:   Corey Lawrence is a 67 y.o. male who presents for an Initial Medicare Annual Wellness Visit.  I connected with  Corey Lawrence on 06/25/22 by a audio enabled telemedicine application and verified that I am speaking with the correct person using two identifiers.  Patient Location: Home  Provider Location: Home Office  I discussed the limitations of evaluation and management by telemedicine. The patient expressed understanding and agreed to proceed.  Review of Systems     Cardiac Risk Factors include: advanced age (>50men, >6 women);male gender;sedentary lifestyle;smoking/ tobacco exposure     Objective:    Today's Vitals   06/25/22 1403  Weight: 121 lb (54.9 kg)  Height: 5\' 2"  (1.575 m)   Body mass index is 22.13 kg/m.     06/25/2022    2:06 PM 03/23/2021    5:50 PM 08/14/2016    2:26 PM 05/22/2016    1:55 PM 05/07/2016   12:21 PM 07/20/2015    2:53 PM 05/25/2015    5:52 PM  Advanced Directives  Does Patient Have a Medical Advance Directive? No No No No No No No  Would patient like information on creating a medical advance directive? No - Patient declined No - Patient declined    No - patient declined information     Current Medications (verified) Outpatient Encounter Medications as of 06/25/2022  Medication Sig   acetaminophen (TYLENOL) 325 MG tablet Take 2 tablets (650 mg total) by mouth every 6 (six) hours as needed for mild pain (or Fever >/= 101). (Patient not taking: Reported on 06/25/2022)   albuterol (PROVENTIL) (2.5 MG/3ML) 0.083% nebulizer solution USE 1 VIAL VIA NEBULIZER EVERY SIX (6) HOURS AS NEEDED FOR WHEEZING OR SHORTNESS OF BREATH (Patient not taking: Reported on 06/25/2022)   albuterol (VENTOLIN HFA) 108 (90 Base) MCG/ACT inhaler Inhale 2 puffs into the lungs every 6 (six) hours as needed for wheezing or shortness of breath. Must have office visit for refills (Patient not taking: Reported on 06/25/2022)   aspirin EC 81 MG tablet Take 1 tablet  (81 mg total) by mouth daily. (Patient not taking: Reported on 06/25/2022)   benzonatate (TESSALON) 100 MG capsule Take 1 capsule (100 mg total) by mouth 3 (three) times daily as needed for cough. (Patient not taking: Reported on 06/25/2022)   budesonide-formoterol (SYMBICORT) 160-4.5 MCG/ACT inhaler Inhale 2 puffs into the lungs in the morning and at bedtime. Must have office visit for refills (Patient not taking: Reported on 06/25/2022)   diphenhydramine-acetaminophen (TYLENOL PM) 25-500 MG TABS tablet Take 1 tablet by mouth at bedtime as needed. (Patient not taking: Reported on 06/25/2022)   HYDROcodone-acetaminophen (NORCO) 7.5-325 MG tablet Take 1 tablet by mouth 2 (two) times daily as needed. (Patient not taking: Reported on 06/25/2022)   Multiple Vitamins-Minerals (MULTIVITAMIN ADULTS 50+) TABS Take 1 tablet by mouth daily. (Patient not taking: Reported on 06/25/2022)   naloxone [facility]) nasal spray 4 mg/0.1 mL 1 spray as directed. (Patient not taking: Reported on 06/25/2022)   nicotine (NICODERM CQ - DOSED IN MG/24 HOURS) 14 mg/24hr patch Place 1 patch (14 mg total) onto the skin daily. (Patient not taking: Reported on 06/25/2022)   nicotine polacrilex (NICORETTE MINI) 4 MG lozenge Use three to four a day to quit smoking (Patient not taking: Reported on 06/25/2022)   predniSONE (DELTASONE) 10 MG tablet Take 4 tablets daily for 5 days then stop (Patient not taking: Reported on 06/25/2022)   Respiratory Therapy Supplies (FLUTTER) DEVI Use 4 times  daily (Patient not taking: Reported on 06/25/2022)   traZODone (DESYREL) 100 MG tablet Take by mouth. (Patient not taking: Reported on 06/25/2022)   umeclidinium bromide (INCRUSE ELLIPTA) 62.5 MCG/ACT AEPB Inhale 1 puff into the lungs daily. (Patient not taking: Reported on 06/25/2022)   No facility-administered encounter medications on file as of 06/25/2022.    Allergies (verified) Patient has no known allergies.   History: Past Medical History:  Diagnosis  Date   Ankle fracture, left    "casted; no OR"   Chronic lower back pain    COPD (chronic obstructive pulmonary disease) (HCC)    Lung nodules 09/12/2017   Tobacco abuse    Past Surgical History:  Procedure Laterality Date   INCISION AND DRAINAGE OF WOUND Left ~ 1977   "splinter got infected between" digits 2 & 3   Family History  Problem Relation Age of Onset   Esophageal cancer Father    Colon cancer Sister    Cancer - Other Brother    Heart disease Mother    Diabetes Mellitus II Mother    Heart disease Other        multiple maternal family members   Diabetes Mellitus II Other        multiple maternal family members   Social History   Socioeconomic History   Marital status: Divorced    Spouse name: Not on file   Number of children: Not on file   Years of education: Not on file   Highest education level: Not on file  Occupational History   Not on file  Tobacco Use   Smoking status: Every Day    Packs/day: 0.25    Years: 43.00    Additional pack years: 0.00    Total pack years: 10.75    Types: Cigarettes   Smokeless tobacco: Never   Tobacco comments:    currently on patch  Substance and Sexual Activity   Alcohol use: No    Alcohol/week: 0.0 standard drinks of alcohol    Comment: "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  Not married.  5 children.     Social Determinants of Health   Financial Resource Strain: Low Risk  (06/25/2022)   Overall Financial Resource Strain (CARDIA)    Difficulty of Paying Living Expenses: Not hard at all  Food Insecurity: No Food Insecurity (06/25/2022)   Hunger Vital Sign    Worried About Running Out of Food in the Last Year: Never true    Ran Out of Food in the Last Year: Never true  Transportation Needs:  Unmet Transportation Needs (06/25/2022)   PRAPARE - Transportation    Lack of Transportation (Medical): Yes    Lack of Transportation (Non-Medical): Yes  Physical Activity: Inactive (06/25/2022)   Exercise Vital Sign    Days of Exercise per Week: 0 days    Minutes of Exercise per Session: 0 min  Stress: No Stress Concern Present (06/25/2022)   [employer] of Occupational Health - Occupational Stress Questionnaire    Feeling of Stress : Only a little  Social Connections: Socially Isolated (06/25/2022)   Social Connection and Isolation Panel [NHANES]    Frequency of Communication with Friends and Family: More than three times a week  Frequency of Social Gatherings with Friends and Family: Once a week    Attends Religious Services: Never    Database administrator or Organizations: No    Attends Engineer, structural: Never    Marital Status: Divorced    Tobacco Counseling Ready to quit: Not Answered Counseling given: Not Answered Tobacco comments: currently on patch   Clinical Intake:  Pre-visit preparation completed: Yes  Pain : No/denies pain  Diabetes: No  How often do you need to have someone help you when you read instructions, pamphlets, or other written materials from your doctor or pharmacy?: 1 - Never  Diabetic?No   Interpreter Needed?: No  Information entered by :: Kandis Fantasia LPN   Activities of Daily Living    06/25/2022    2:06 PM  In your present state of health, do you have any difficulty performing the following activities:  Hearing? 0  Vision? 0  Difficulty concentrating or making decisions? 0  Walking or climbing stairs? 1  Dressing or bathing? 0  Doing errands, shopping? 1  Preparing Food and eating ? N  Using the Toilet? N  In the past six months, have you accidently leaked urine? N  Do you have problems with loss of bowel control? N  Managing your Medications? N  Managing your Finances? N  Housekeeping or managing your  Housekeeping? N    Patient Care Team: Marcine Matar, MD as PCP - General (Internal Medicine) Storm Frisk, MD as PCP - Pulmonology (Pulmonary Disease)  Indicate any recent Medical Services you may have received from other than [facility] providers in the past year (date may be approximate).     Assessment:   This is a routine wellness examination for Kenn.  Hearing/Vision screen Hearing Screening - Comments:: Denies hearing difficulties   Vision Screening - Comments:: No vision problems; will schedule routine eye exam soon    Dietary issues and exercise activities discussed: Current Exercise Habits: The patient does not participate in regular exercise at present   Goals Addressed             This Visit's Progress    Improve breathing        Depression Screen    06/25/2022    2:05 PM 06/14/2021   10:49 AM 11/27/2020    9:48 AM 07/10/2020    2:19 PM 01/06/2020    8:54 AM 12/28/2019    1:56 PM 08/12/2019    4:03 PM  PHQ 2/9 Scores  PHQ - 2 Score  4 3 1 4 3 4   PHQ- 9 Score  9 8 9 17 10 12   Exception Documentation Patient refusal          Fall Risk    06/25/2022    2:04 PM 06/14/2021   10:49 AM 11/27/2020    9:30 AM 12/28/2019    1:56 PM 02/17/2018    9:16 AM  Fall Risk   Falls in the past year? 1 0 0 0 0  Number falls in past yr: 0 0 0 0   Injury with Fall? 1 0 0 0   Risk for fall due to : Impaired balance/gait No Fall Risks No Fall Risks    Follow up Falls evaluation completed;Education provided;Falls prevention discussed        FALL RISK PREVENTION PERTAINING TO THE HOME:  Any stairs in or around the home? No  If so, are there any without handrails? No  Home free of loose throw rugs in walkways,  pet beds, electrical cords, etc? Yes  Adequate lighting in your home to reduce risk of falls? Yes   ASSISTIVE DEVICES UTILIZED TO PREVENT FALLS:  Life alert? No  Use of a cane, walker or w/c? No  Grab bars in the bathroom? Yes  Shower chair or bench in  shower? No  Elevated toilet seat or a handicapped toilet? No   TIMED UP AND GO:  Was the test performed? No . Telephonic visit   Cognitive Function:        06/25/2022    2:07 PM  6CIT Screen  What Year? 0 points  What month? 0 points  What time? 0 points  Count back from 20 0 points  Months in reverse 0 points  Repeat phrase 0 points  Total Score 0 points    Immunizations Immunization History  Administered Date(s) Administered   Hepatitis B 10/21/2017   Hepatitis B, ADULT 11/20/2017   Influenza,inj,Quad PF,6+ Mos 11/20/2017, 01/06/2020, 11/27/2020   PFIZER(Purple Top)SARS-COV-2 Vaccination 09/21/2019, 10/27/2019   Pneumococcal Polysaccharide-23 09/23/2013   Tdap 03/21/2017    TDAP status: Up to date  Pneumococcal vaccine status: Due, Education has been provided regarding the importance of this vaccine. Advised may receive this vaccine at local pharmacy or Health Dept. Aware to provide a copy of the vaccination record if obtained from local pharmacy or Health Dept. Verbalized acceptance and understanding.  Covid-19 vaccine status: Information provided on how to obtain vaccines.   Qualifies for Shingles Vaccine? Yes   Zostavax completed No   Shingrix Completed?: No.    Education has been provided regarding the importance of this vaccine. Patient has been advised to call insurance company to determine out of pocket expense if they have not yet received this vaccine. Advised may also receive vaccine at local pharmacy or Health Dept. Verbalized acceptance and understanding.  Screening Tests Health Maintenance  Topic Date Due   COLONOSCOPY (Pts 45-29yrs Insurance coverage will need to be confirmed)  Never done   Zoster Vaccines- Shingrix (1 of 2) Never done   Pneumonia Vaccine 40+ Years old (2 of 2 - PCV) 09/24/2014   COVID-19 Vaccine (3 - 2023-24 season) 10/05/2021   INFLUENZA VACCINE  09/05/2022   Medicare Annual Wellness (AWV)  06/25/2023   DTaP/Tdap/Td (2 - Td or  Tdap) 03/22/2027   Hepatitis C Screening  Completed   HPV VACCINES  Aged Out    Health Maintenance  Health Maintenance Due  Topic Date Due   COLONOSCOPY (Pts 45-75yrs Insurance coverage will need to be confirmed)  Never done   Zoster Vaccines- Shingrix (1 of 2) Never done   Pneumonia Vaccine 58+ Years old (2 of 2 - PCV) 09/24/2014   COVID-19 Vaccine (3 - 2023-24 season) 10/05/2021    Colorectal cancer screening:  Patient declines   Lung Cancer Screening: (Low Dose CT Chest recommended if Age 60-80 years, 30 pack-year currently smoking OR have quit w/in 15years.) does qualify.   Lung Cancer Screening Referral: patient declines   Additional Screening:  Hepatitis C Screening: does qualify; Completed 06/14/21  Vision Screening: Recommended annual ophthalmology exams for early detection of glaucoma and other disorders of the eye. Is the patient up to date with their annual eye exam?  No  Who is the provider or what is the name of the office in which the patient attends annual eye exams? none If pt is not established with a provider, would they like to be referred to a provider to establish care? No .  Dental Screening: Recommended annual dental exams for proper oral hygiene  Community Resource Referral / Chronic Care Management: CRR required this visit?  No   CCM required this visit?  No      Plan:     I have personally reviewed and noted the following in the patient's chart:   Medical and social history Use of alcohol, tobacco or illicit drugs  Current medications and supplements including opioid prescriptions. Patient is not currently taking opioid prescriptions. Functional ability and status Nutritional status Physical activity Advanced directives List of other physicians Hospitalizations, surgeries, and ER visits in previous 12 months Vitals Screenings to include cognitive, depression, and falls Referrals and appointments  In addition, I have reviewed and  discussed with patient certain preventive protocols, quality metrics, and best practice recommendations. A written personalized care plan for preventive services as well as general preventive health recommendations were provided to patient.     Durwin Nora, [place]   [id number]/78/4696   Due to this being a virtual visit, the after visit summary with patients personalized plan was offered to patient via mail or my-chart. per request, patient was mailed a copy of AVS  Nurse Notes: Patient recently had a fall and is having pain in left leg and right arm; he plans to go to ER due to problems with consistent transportation.

## 2022-07-05 ENCOUNTER — Other Ambulatory Visit: Payer: Self-pay | Admitting: Critical Care Medicine

## 2022-07-05 ENCOUNTER — Other Ambulatory Visit: Payer: Self-pay | Admitting: Internal Medicine

## 2022-07-05 DIAGNOSIS — J4489 Other specified chronic obstructive pulmonary disease: Secondary | ICD-10-CM

## 2022-07-05 NOTE — Telephone Encounter (Signed)
Requested Prescriptions  Pending Prescriptions Disp Refills   albuterol (VENTOLIN HFA) 108 (90 Base) MCG/ACT inhaler [Pharmacy Med Name: ALBUTEROL HFA INH(200 PUFFS) 18GM] 36 g     Sig: INHALE 2 PUFFS INTO THE LUNGS EVERY 6 HOURS AS NEEDED FOR WHEEZING OR SHORTNESS OF BREATH     Pulmonology:  Beta Agonists 2 Failed - 07/05/2022  9:14 AM      Failed - Valid encounter within last 12 months    Recent Outpatient Visits           9 months ago No-show for appointment   [facility] Storm Frisk, MD   1 year ago Aortic atherosclerosis [facility])   [place] [facility] Storm Frisk, MD   1 year ago 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[facility])   [facility] Marcine Matar, MD   1 year ago 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[facility])   [place] [facility] Storm Frisk, MD   1 year ago COPD exacerbation [facility])   [place] [facility] Storm Frisk, MD       Future Appointments             In 1 month Marcine Matar, MD [facility]            Passed - Last BP in normal range    BP Readings from Last 1 Encounters:  06/14/21 128/81         Passed - Last Heart Rate in normal range    Pulse Readings from Last 1 Encounters:  06/14/21 84          albuterol (PROVENTIL) (2.5 MG/3ML) 0.083% nebulizer solution [Pharmacy Med Name: ALBUTEROL 0.083%(2.5MG /3ML) 30X3ML] 90 mL 0    Sig: USE 1 VIAL VIA NEBULIZER EVERY 6 HOURS AS NEEDED FOR WHEEZING OR SHORTNESS OF BREATH     Pulmonology:  Beta Agonists 2 Failed - 07/05/2022  9:14 AM      Failed - Valid encounter within last 12 months    Recent Outpatient Visits           9 months ago No-show for appointment   [facility] Storm Frisk, MD   1 year ago Aortic atherosclerosis [facility])   [place] [facility] Storm Frisk, MD   1 year ago 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[facility])   [place] [facility] Marcine Matar, MD   1 year ago 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[facility])   [place] [facility] Storm Frisk, MD   1 year ago COPD exacerbation [facility])   [place] [facility] Storm Frisk, MD       Future Appointments             In 1 month Marcine Matar, MD [facility]            Passed - Last BP in normal range    BP Readings from Last 1 Encounters:  06/14/21 128/81         Passed - Last Heart Rate in normal range    Pulse Readings from Last 1 Encounters:  06/14/21 84

## 2022-07-26 ENCOUNTER — Other Ambulatory Visit: Payer: Self-pay | Admitting: Internal Medicine

## 2022-07-26 DIAGNOSIS — J4489 Other specified chronic obstructive pulmonary disease: Secondary | ICD-10-CM

## 2022-07-31 ENCOUNTER — Other Ambulatory Visit: Payer: Self-pay | Admitting: Internal Medicine

## 2022-08-14 ENCOUNTER — Other Ambulatory Visit: Payer: Self-pay | Admitting: Internal Medicine

## 2022-08-14 DIAGNOSIS — J4489 Other specified chronic obstructive pulmonary disease: Secondary | ICD-10-CM

## 2022-08-29 ENCOUNTER — Encounter: Payer: Self-pay | Admitting: Internal Medicine

## 2022-08-29 ENCOUNTER — Ambulatory Visit: Payer: 59 | Admitting: Internal Medicine

## 2022-08-29 VITALS — BP 135/71 | HR 61 | Temp 98.3°F | Ht 62.0 in | Wt 125.8 lb

## 2022-08-29 DIAGNOSIS — I700 Atherosclerosis of aorta: Secondary | ICD-10-CM

## 2022-08-29 DIAGNOSIS — Z9181 History of falling: Secondary | ICD-10-CM

## 2022-08-29 DIAGNOSIS — M79652 Pain in left thigh: Secondary | ICD-10-CM

## 2022-08-29 DIAGNOSIS — M25511 Pain in right shoulder: Secondary | ICD-10-CM

## 2022-08-29 DIAGNOSIS — R918 Other nonspecific abnormal finding of lung field: Secondary | ICD-10-CM

## 2022-08-29 DIAGNOSIS — R7303 Prediabetes: Secondary | ICD-10-CM

## 2022-08-29 DIAGNOSIS — Z23 Encounter for immunization: Secondary | ICD-10-CM

## 2022-08-29 DIAGNOSIS — F17200 Nicotine dependence, unspecified, uncomplicated: Secondary | ICD-10-CM

## 2022-08-29 DIAGNOSIS — F17210 Nicotine dependence, cigarettes, uncomplicated: Secondary | ICD-10-CM | POA: Diagnosis not present

## 2022-08-29 DIAGNOSIS — J4489 Other specified chronic obstructive pulmonary disease: Secondary | ICD-10-CM | POA: Diagnosis not present

## 2022-08-29 DIAGNOSIS — Z1211 Encounter for screening for malignant neoplasm of colon: Secondary | ICD-10-CM

## 2022-08-29 LAB — GLUCOSE, POCT (MANUAL RESULT ENTRY): POC Glucose: 95 mg/dl (ref 70–99)

## 2022-08-29 LAB — POCT GLYCOSYLATED HEMOGLOBIN (HGB A1C): HbA1c, POC (prediabetic range): 5.7 % (ref 5.7–6.4)

## 2022-08-29 MED ORDER — ALBUTEROL SULFATE (2.5 MG/3ML) 0.083% IN NEBU
INHALATION_SOLUTION | RESPIRATORY_TRACT | 6 refills | Status: DC
Start: 2022-08-29 — End: 2023-02-27

## 2022-08-29 MED ORDER — ALBUTEROL SULFATE HFA 108 (90 BASE) MCG/ACT IN AERS
2.0000 | INHALATION_SPRAY | Freq: Four times a day (QID) | RESPIRATORY_TRACT | 11 refills | Status: AC | PRN
Start: 2022-08-29 — End: ?

## 2022-08-29 MED ORDER — NICOTINE POLACRILEX 2 MG MT GUM
2.0000 mg | CHEWING_GUM | OROMUCOSAL | 0 refills | Status: AC | PRN
Start: 2022-08-29 — End: ?

## 2022-08-29 MED ORDER — ZOSTER VAC RECOMB ADJUVANTED 50 MCG/0.5ML IM SUSR
0.5000 mL | Freq: Once | INTRAMUSCULAR | 0 refills | Status: AC
Start: 2022-08-29 — End: 2022-08-29

## 2022-08-29 MED ORDER — BUDESONIDE-FORMOTEROL FUMARATE 160-4.5 MCG/ACT IN AERO
2.0000 | INHALATION_SPRAY | Freq: Two times a day (BID) | RESPIRATORY_TRACT | 12 refills | Status: AC
Start: 2022-08-29 — End: ?

## 2022-08-29 NOTE — Progress Notes (Signed)
Patient ID: Corey Lawrence, male    DOB: 20-Apr-1955  MRN: [id number]  CC: Follow-up (Follow-up. Med refills. /Reports fall X6-7 weeks ago - pain on L leg to L hip. Pain on R shoulder X6-7 weeks (same fall)/Yes to pneumonia vax. )   Subjective: Corey Lawrence is a 67 y.o. male who presents for chronic ds management His concerns today include:  COPD with chronic bronchitis, lung nodules, Hep C treated, tob dep, chronic LBP and anxiety disorder, aortic atherosclerosis.   COPD: Purchase a spray on-line called  Lung Cleanser that he feels helps his breathing.  Has it with him today.  Label states it contains eucalyptus, peppermint, licorice root, honeysuckle, calendula, vitamin C and vitamin D. Uses Albuterol neb 2-3x/day.  His pharmacy only disp 30 at a time instead of 90.  Uses ProAir inhaler when he has an attack; usually has attack Q 3 days Uses Symbicort inhaler BID but uses more when he runs out of ProAir.  Also has Xopenex inhaler which he uses when he runs out of ProAir.  Would like for me to RF both Not using Incruse.  Does not find it helpful.   own to 5 cigarettes a wk.  He hopes the Lung Cleanse that he is using will take away urge to smoke.  Really wants to quit.   -History of lung nodules.  Overdue for repeat CT of the chest.  C/O arthritis in fingers and pain in RT shoulder and LT upper inner thigh Fell 6-7 wks ago.  Coming down a step from off a porch and missed the last step.  Landed on RT arm.  Has had pain in the right shoulder since.  Cannot elevate the arm.  Has not gotten better.  Also reports a pulling pain in the left upper inner thigh since the fall.  Does not feel that it is in the muscle; feels more in the bone.  Thinks he may have hurt the bone.    History of prediabetes.  Reports he drinks a lot of [employer] during the day.   HM: Due for PCV 15 and shingles vaccine.  Also due for colon cancer screening.  Patient Active Problem List   Diagnosis Date Noted    Aortic atherosclerosis (HCC) 07/10/2020   Elevated blood-pressure reading, without diagnosis of hypertension 02/23/2019   Coronary arteriosclerosis 01/26/2019   Centrilobular emphysema (HCC) 08/12/2018   Lumbar radiculopathy 08/12/2018   Cervical radiculopathy 08/12/2018   Urinary hesitancy 02/09/2018   Multiple lung nodules on CT 09/12/2017   Tobacco dependence 09/12/2017   Hep C w/o coma, chronic (HCC) 09/12/2017   Generalized anxiety disorder 05/22/2016   Prediabetes 09/29/2013   Chronic low back pain 09/29/2013   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09/21/2013     Current Outpatient Medications on File Prior to Visit  Medication Sig Dispense Refill   acetaminophen (TYLENOL) 325 MG tablet Take 2 tablets (650 mg total) by mouth every 6 (six) hours as needed for mild pain (or Fever >/= 101). (Patient not taking: Reported on 06/25/2022)     albuterol (VENTOLIN HFA) 108 (90 Base) MCG/ACT inhaler Inhale 2 puffs into the lungs every 6 (six) hours as needed for wheezing or shortness of breath. Must have office visit for refills (Patient not taking: Reported on 08/29/2022) 8.5 g 1   aspirin EC 81 MG tablet Take 1 tablet (81 mg total) by mouth daily. (Patient not taking: Reported on 06/25/2022) 100 tablet 1   Respiratory Therapy Supplies (FLUTTER) DEVI  Use 4 times daily (Patient not taking: Reported on 06/25/2022) 1 each 0   traZODone (DESYREL) 100 MG tablet Take by mouth. (Patient not taking: Reported on 06/25/2022)     umeclidinium bromide (INCRUSE ELLIPTA) 62.5 MCG/ACT AEPB Inhale 1 puff into the lungs daily. (Patient not taking: Reported on 06/25/2022) 30 each 6   No current facility-administered medications on file prior to visit.    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  Not married.  5 children.     Social Determinants of Health   Financial Resource Strain: Low Risk  (06/25/2022)   Overall Financial Resource Strain (CARDIA)    Difficulty of Paying Living Expenses: Not hard at all  Food Insecurity: No Food Insecurity (06/25/2022)   Hunger Vital Sign    Worried About Running Out of Food in the Last Year: Never true    Ran Out of Food in the Last Year: Never true  Transportation Needs: Unmet Transportation Needs (06/25/2022)   PRAPARE - Transportation    Lack of Transportation (Medical): Yes    Lack of Transportation (Non-Medical): Yes  Physical Activity: Inactive (06/25/2022)   Exercise Vital Sign    Days of Exercise per Week: 0 days    Minutes of Exercise per Session: 0 min  Stress: No Stress Concern Present (06/25/2022)   [employer] of Occupational Health - Occupational Stress Questionnaire    Feeling of Stress : Only a little  Social Connections: Socially Isolated (06/25/2022)   Social Connection and Isolation Panel [NHANES]    Frequency of Communication with Friends and Family: More than three times a week    Frequency of Social Gatherings with Friends and Family: Once a week    Attends Religious Services: Never    Database administrator or Organizations: No    Attends Banker Meetings: Never    Marital Status: Divorced   Catering manager Violence: Not At Risk (06/25/2022)   Humiliation, Afraid, Rape, and Kick questionnaire    Fear of Current or Ex-Partner: No    Emotionally Abused: No    Physically Abused: No    Sexually Abused: No    Family History  Problem Relation Age of Onset   Esophageal cancer Father    Colon cancer Sister    Cancer - Other Brother    Heart disease Mother    Diabetes Mellitus II Mother    Heart disease Other        multiple maternal family members  Diabetes Mellitus II Other        multiple maternal family members    Past Surgical History:  Procedure Laterality Date   INCISION AND DRAINAGE OF WOUND Left ~ 1977   "splinter got infected between" digits 2 & 3    ROS: Review of Systems Negative except as stated above  PHYSICAL EXAM: BP 135/71 (BP Location: Left Arm, Patient Position: Sitting, Cuff Size: Normal)   Pulse 61   Temp 98.3 F (36.8 C) (Oral)   Ht 5\' 2"  (1.575 m)   Wt 125 lb 12.8 oz (57.1 kg)   SpO2 99%   BMI 23.01 kg/m   Wt Readings from Last 3 Encounters:  08/29/22 125 lb 12.8 oz (57.1 kg)  06/25/22 121 lb (54.9 kg)  06/14/21 121 lb 3.2 oz (55 kg)    Physical Exam   General appearance - alert, well appearing, and in no distress Mental status - normal mood, behavior, speech, dress, motor activity, and thought processes Mouth - mucous membranes moist, pharynx normal without lesions Neck - supple, no significant adenopathy Chest -mild diffuse wheezing.  Air entry slightly decreased. Heart - normal rate, regular rhythm, normal S1, S2, no murmurs, rubs, clicks or gallops Musculoskeletal -right shoulder: No point tenderness on palpation of the joint line.  Mild to moderate discomfort with attempted passive range of motion.  Not able to elevated more than 90 degrees.  Left leg: He has good range of motion of the left hip.  Hands: Mild enlargement of the PIP and DIP joints Extremities -no lower extremity edema  Results for orders placed or performed  in visit on 08/29/22  POCT glycosylated hemoglobin (Hb A1C)  Result Value Ref Range   Hemoglobin A1C     HbA1c POC (<> result, manual entry)     HbA1c, POC (prediabetic range) 5.7 5.7 - 6.4 %   HbA1c, POC (controlled diabetic range)    POCT glucose (manual entry)  Result Value Ref Range   POC Glucose 95 70 - 99 mg/dl       Latest Ref Rng & Units 03/23/2021    2:10 PM 08/27/2018    4:06 PM 02/09/2018   10:26 AM  CMP  Glucose 70 - 99 mg/dL 644  034  88   BUN 8 - 23 mg/dL 9  21  10    Creatinine 0.61 - 1.24 mg/dL 7.42  5.95  6.38   Sodium 135 - 145 mmol/L 137  140  139   Potassium 3.5 - 5.1 mmol/L 3.9  4.0  3.7   Chloride 98 - 111 mmol/L 98  103  103   CO2 22 - 32 mmol/L 26  27  28    Calcium 8.9 - 10.3 mg/dL 9.3  9.5  9.7   Total Protein 6.5 - 8.1 g/dL 7.8  7.8  7.0   Total Bilirubin 0.3 - 1.2 mg/dL 0.4  0.6  0.6   Alkaline Phos 38 - 126 U/L 64     AST 15 - 41 U/L 21  103  64   ALT 0 - 44 U/L 10  99  60    57    Lipid Panel     Component Value Date/Time   CHOL 153 07/22/2017 1040   TRIG 76 07/22/2017 1040   HDL 37 (L) 07/22/2017 1040   CHOLHDL 4.1 07/22/2017 1040   CHOLHDL 3.6 12/05/2014 1214   VLDL 22 12/05/2014 1214   LDLCALC 101 (H) 07/22/2017 1040    CBC    Component  Value Date/Time   WBC 11.9 (H) 03/23/2021 1410   RBC 5.15 03/23/2021 1410   HGB 16.0 03/23/2021 1410   HGB 14.7 07/22/2017 1040   HCT 47.4 03/23/2021 1410   HCT 43.4 07/22/2017 1040   PLT 265 03/23/2021 1410   PLT 252 07/22/2017 1040   MCV 92.0 03/23/2021 1410   MCV 94 07/22/2017 1040   MCH 31.1 03/23/2021 1410   MCHC 33.8 03/23/2021 1410   RDW 12.9 03/23/2021 1410   RDW 14.0 07/22/2017 1040   LYMPHSABS 1.1 03/23/2021 1410   LYMPHSABS 2.4 08/14/2016 1630   MONOABS 0.2 03/23/2021 1410   EOSABS 0.1 03/23/2021 1410   EOSABS 0.7 (H) 08/14/2016 1630   BASOSABS 0.1 03/23/2021 1410   BASOSABS 0.1 08/14/2016 1630    ASSESSMENT AND PLAN: 1. 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Not  well-controlled. Informed him that I have never heard of anything called a Lung Cleanse.  May want to speak with pulmonary about this.  Refill sent on Symbicort and ProAir inhaler.  I do not think he needs to be on both ProAir and Xopenex inhalers.  He declines refill on Incruse. Will refer to pulmonary since Dr. Delford Field is retiring in September - CBC - Comprehensive metabolic panel - Ambulatory referral to Pulmonology - albuterol (PROVENTIL) (2.5 MG/3ML) 0.083% nebulizer solution; USE 1 VIAL VIA NEBULIZER EVERY SIX (6) HOURS AS NEEDED FOR WHEEZING OR SHORTNESS OF BREATH  Dispense: 90 mL; Refill: 6 - budesonide-formoterol (SYMBICORT) 160-4.5 MCG/ACT inhaler; Inhale 2 puffs into the lungs in the morning and at bedtime.  Dispense: 10.2 g; Refill: 12 - albuterol (VENTOLIN HFA) 108 (90 Base) MCG/ACT inhaler; Inhale 2 puffs into the lungs every 6 (six) hours as needed for wheezing or shortness of breath. Please Give ProAir  Dispense: 8 g; Refill: 11  2. Multiple lung nodules on CT - CT CHEST NODULE FOLLOW UP LOW DOSE W/O; Future  3. Tobacco dependence Commended him on cutting back.  Advised to quit completely.  He is trying to quit.  He would like to try the nicotine gum. - nicotine polacrilex (NICORETTE) 2 MG gum; Take 1 each (2 mg total) by mouth as needed for smoking cessation.  Dispense: 100 tablet; Refill: 0  4. Prediabetes Dietary counseling given.  Encouraged him to eliminate sugary drinks from the diet - POCT glycosylated hemoglobin (Hb A1C) - POCT glucose (manual entry)  5. Acute pain of right shoulder Questionable rotator cuff injury. - Ambulatory referral to Orthopedics - DG Shoulder Right; Future  6. Acute thigh pain, left Will get an x-ray of the femur to rule out called fracture of the upper inner femoral bone - DG FEMUR MIN 2 VIEWS LEFT; Future  7. History of recent fall Encouraged him to be careful and look down when coming down steps.  8. Screening for colon  cancer Discussed colon cancer screening methods.  He prefers Cologuard - Cologuard  9. Aortic atherosclerosis (HCC) - Lipid panel  10. Need for vaccination against Streptococcus pneumoniae - PNEUMOCOCCAL CONJUGATE VACCINE 15-VALENT  11. Need for shingles vaccine Scription given for him to get Shingrix vaccine at any outside pharmacy. - Zoster Vaccine Adjuvanted [facility]) injection; Inject 0.5 mLs into the muscle once for 1 dose.  Dispense: 0.5 mL; Refill: 0     Patient was given the opportunity to ask questions.  Patient verbalized understanding of the plan and was able to repeat key elements of the plan.   This documentation was completed using Paediatric nurse.  Any transcriptional  errors are unintentional.  Orders Placed This Encounter  Procedures   CT CHEST NODULE FOLLOW UP LOW DOSE W/O   DG Shoulder Right   DG FEMUR MIN 2 VIEWS LEFT   PNEUMOCOCCAL CONJUGATE VACCINE 15-VALENT   CBC   Comprehensive metabolic panel   Lipid panel   Cologuard   Ambulatory referral to Pulmonology   Ambulatory referral to Orthopedics   POCT glycosylated hemoglobin (Hb A1C)   POCT glucose (manual entry)     Requested Prescriptions   Signed Prescriptions Disp Refills   nicotine polacrilex (NICORETTE) 2 MG gum 100 tablet 0    Sig: Take 1 each (2 mg total) by mouth as needed for smoking cessation.   Zoster Vaccine Adjuvanted [facility]) injection 0.5 mL 0    Sig: Inject 0.5 mLs into the muscle once for 1 dose.   albuterol (PROVENTIL) (2.5 MG/3ML) 0.083% nebulizer solution 90 mL 6    Sig: USE 1 VIAL VIA NEBULIZER EVERY SIX (6) HOURS AS NEEDED FOR WHEEZING OR SHORTNESS OF BREATH   budesonide-formoterol (SYMBICORT) 160-4.5 MCG/ACT inhaler 10.2 g 12    Sig: Inhale 2 puffs into the lungs in the morning and at bedtime.   albuterol (VENTOLIN HFA) 108 (90 Base) MCG/ACT inhaler 8 g 11    Sig: Inhale 2 puffs into the lungs every 6 (six) hours as needed for wheezing or shortness of  breath. Please Give ProAir    Return in about 4 months (around 12/30/2022).  Jonah Blue, MD, FACP

## 2023-02-27 ENCOUNTER — Other Ambulatory Visit: Payer: Self-pay | Admitting: Internal Medicine

## 2023-02-27 DIAGNOSIS — J4489 Other specified chronic obstructive pulmonary disease: Secondary | ICD-10-CM

## 2023-03-03 ENCOUNTER — Other Ambulatory Visit: Payer: Self-pay | Admitting: Internal Medicine

## 2023-03-03 ENCOUNTER — Telehealth: Payer: Self-pay

## 2023-03-03 DIAGNOSIS — J4489 Other specified chronic obstructive pulmonary disease: Secondary | ICD-10-CM

## 2023-03-03 NOTE — Transitions of Care (Post Inpatient/ED Visit) (Signed)
   03/03/2023  Name: Corey Lawrence MRN: [id number] DOB: November 14, 1955  Today's TOC FU Call Status: Today's TOC FU Call Status:: Unsuccessful Call (1st Attempt) Unsuccessful Call (1st Attempt) Date: 03/03/23  Attempted to reach the patient regarding the most recent Inpatient/ED visit.  Follow Up Plan: Additional outreach attempts will be made to reach the patient to complete the Transitions of Care (Post Inpatient/ED visit) call.   Alyse Low, RN, BA, [facility], CRRN [facility] Coordinator, Transition of Care Ph # [phone]

## 2023-03-04 ENCOUNTER — Telehealth: Payer: Self-pay

## 2023-03-04 NOTE — Transitions of Care (Post Inpatient/ED Visit) (Signed)
   03/04/2023  Name: Corey Lawrence MRN: [id number] DOB: [birth year]  Today's TOC FU Call Status: Today's TOC FU Call Status:: Unsuccessful Call (2nd Attempt) Unsuccessful Call (2nd Attempt) Date: 03/04/23  Attempted to reach the patient regarding the most recent Inpatient/ED visit.  Follow Up Plan: Additional outreach attempts will be made to reach the patient to complete the Transitions of Care (Post Inpatient/ED visit) call.   Alyse Low, RN, BA, [facility], CRRN [facility] Coordinator, Transition of Care Ph # [phone]

## 2023-03-05 ENCOUNTER — Telehealth: Payer: Self-pay

## 2023-03-05 NOTE — Transitions of Care (Post Inpatient/ED Visit) (Signed)
   03/05/2023  Name: FOWLER ANTOS MRN: [id number] DOB: [birth year]  Today's TOC FU Call Status: Today's TOC FU Call Status:: Unsuccessful Call (3rd Attempt) Unsuccessful Call (3rd Attempt) Date: 03/05/23  Attempted to reach the patient regarding the most recent Inpatient/ED visit.  Follow Up Plan: No further outreach attempts will be made at this time. We have been unable to contact the patient.  Alyse Low, RN, BA, [facility], CRRN [facility] Coordinator, Transition of Care Ph # [phone]

## 2023-03-11 ENCOUNTER — Inpatient Hospital Stay: Payer: 59 | Admitting: Nurse Practitioner

## 2023-04-07 ENCOUNTER — Other Ambulatory Visit: Payer: Self-pay | Admitting: Internal Medicine

## 2023-04-07 DIAGNOSIS — J4489 Other specified chronic obstructive pulmonary disease: Secondary | ICD-10-CM

## 2023-07-01 ENCOUNTER — Ambulatory Visit: Payer: 59 | Attending: Internal Medicine

## 2023-09-18 ENCOUNTER — Ambulatory Visit: Payer: Self-pay

## 2023-09-18 NOTE — Telephone Encounter (Signed)
 Attempted to call the patient 3 times with no answer. Unable to LVM. Routing to clinic.

## 2023-09-18 NOTE — Telephone Encounter (Signed)
 Copied from CRM #[id number]. Topic: Clinical - Medical Advice >> Sep 18, 2023  3:16 PM Myrick T wrote: Reason for CRM: patient called stated he has a chest cold and COPD.Patient is requesting a medication to clear up the congestion. Patient said he is out of town and can not get back for an appt today.

## 2023-09-30 ENCOUNTER — Telehealth: Payer: Self-pay | Admitting: *Deleted

## 2023-09-30 NOTE — Transitions of Care (Post Inpatient/ED Visit) (Signed)
   09/30/2023  Name: Corey Lawrence MRN: [id number] DOB: [birth year]  RNCM performing TOC outreach. [name] was discharged from [facility] in [place] on 09/29/23. Patient reports new PCP in [facility]. Unable to provide name of provider. RNCM advised patient to provide his insurance plan with updated PCP information. Patient voiced understanding.   Andrea Dimes RN, BSN [place]  Value-Based [facility] RN Care Manager [phone]
